# Patient Record
Sex: Male | Born: 1950
Health system: Southern US, Community
[De-identification: ages and names within clinical notes are randomized; demographics above are authoritative.]

## PROBLEM LIST (undated history)

## (undated) DIAGNOSIS — I429 Cardiomyopathy, unspecified: Secondary | ICD-10-CM

## (undated) DIAGNOSIS — I252 Old myocardial infarction: Secondary | ICD-10-CM

## (undated) DIAGNOSIS — I519 Heart disease, unspecified: Secondary | ICD-10-CM

## (undated) DIAGNOSIS — R0609 Other forms of dyspnea: Secondary | ICD-10-CM

## (undated) DIAGNOSIS — J45909 Unspecified asthma, uncomplicated: Secondary | ICD-10-CM

## (undated) DIAGNOSIS — I219 Acute myocardial infarction, unspecified: Secondary | ICD-10-CM

## (undated) DIAGNOSIS — K4090 Unilateral inguinal hernia, without obstruction or gangrene, not specified as recurrent: Secondary | ICD-10-CM

## (undated) DIAGNOSIS — J439 Emphysema, unspecified: Secondary | ICD-10-CM

## (undated) DIAGNOSIS — R7303 Prediabetes: Secondary | ICD-10-CM

## (undated) DIAGNOSIS — I2510 Atherosclerotic heart disease of native coronary artery without angina pectoris: Secondary | ICD-10-CM

## (undated) DIAGNOSIS — E782 Mixed hyperlipidemia: Secondary | ICD-10-CM

## (undated) DIAGNOSIS — J449 Chronic obstructive pulmonary disease, unspecified: Secondary | ICD-10-CM

## (undated) DIAGNOSIS — I213 ST elevation (STEMI) myocardial infarction of unspecified site: Secondary | ICD-10-CM

## (undated) DIAGNOSIS — K219 Gastro-esophageal reflux disease without esophagitis: Secondary | ICD-10-CM

## (undated) DIAGNOSIS — R911 Solitary pulmonary nodule: Secondary | ICD-10-CM

## (undated) DIAGNOSIS — I2699 Other pulmonary embolism without acute cor pulmonale: Secondary | ICD-10-CM

## (undated) DIAGNOSIS — E785 Hyperlipidemia, unspecified: Secondary | ICD-10-CM

## (undated) DIAGNOSIS — J42 Unspecified chronic bronchitis: Secondary | ICD-10-CM

## (undated) DIAGNOSIS — J309 Allergic rhinitis, unspecified: Secondary | ICD-10-CM

## (undated) DIAGNOSIS — I10 Essential (primary) hypertension: Secondary | ICD-10-CM

## (undated) DIAGNOSIS — R079 Chest pain, unspecified: Secondary | ICD-10-CM

## (undated) DIAGNOSIS — N183 Chronic kidney disease, stage 3 (moderate): Secondary | ICD-10-CM

## (undated) DIAGNOSIS — Z79899 Other long term (current) drug therapy: Secondary | ICD-10-CM

## (undated) HISTORY — DX: Unilateral inguinal hernia, without obstruction or gangrene, not specified as recurrent: K40.90

## (undated) HISTORY — DX: Chronic obstructive pulmonary disease, unspecified: J44.9

## (undated) HISTORY — PX: PROSTATE BIOPSY: SHX241

## (undated) HISTORY — DX: Prediabetes: R73.03

## (undated) HISTORY — DX: Mixed hyperlipidemia: E78.2

## (undated) HISTORY — PX: CATARACT EXTRACTION W/ INTRAOCULAR LENS  IMPLANT, BILATERAL: SHX1307

## (undated) HISTORY — DX: Essential (primary) hypertension: I10

## (undated) HISTORY — DX: Other forms of dyspnea: R06.09

## (undated) HISTORY — DX: Solitary pulmonary nodule: R91.1

## (undated) HISTORY — DX: Old myocardial infarction: I25.2

## (undated) HISTORY — DX: Unspecified asthma, uncomplicated: J45.909

## (undated) HISTORY — DX: Cardiomyopathy, unspecified: I42.9

## (undated) HISTORY — DX: Allergic rhinitis, unspecified: J30.9

## (undated) HISTORY — DX: ST elevation (STEMI) myocardial infarction of unspecified site: I21.3

## (undated) HISTORY — DX: Other long term (current) drug therapy: Z79.899

## (undated) HISTORY — DX: Chest pain, unspecified: R07.9

## (undated) HISTORY — DX: Chronic kidney disease, stage 3 (moderate): N18.3

## (undated) HISTORY — PX: CORONARY ANGIOPLASTY WITH STENT PLACEMENT: SHX49

## (undated) HISTORY — DX: Atherosclerotic heart disease of native coronary artery without angina pectoris: I25.10

## (undated) HISTORY — DX: Heart disease, unspecified: I51.9

---

## 1980-11-19 HISTORY — PX: KNEE ARTHROSCOPY: SHX127

## 2015-02-24 ENCOUNTER — Institutional Professional Consult (permissible substitution): Payer: Self-pay | Admitting: Internal Medicine

## 2015-04-25 ENCOUNTER — Ambulatory Visit (INDEPENDENT_AMBULATORY_CARE_PROVIDER_SITE_OTHER): Payer: BC Managed Care – PPO | Admitting: Internal Medicine

## 2015-04-25 VITALS — BP 102/76 | HR 60 | Ht 73.5 in | Wt 197.0 lb

## 2015-04-25 DIAGNOSIS — R0609 Other forms of dyspnea: Secondary | ICD-10-CM | POA: Diagnosis not present

## 2015-04-25 DIAGNOSIS — J441 Chronic obstructive pulmonary disease with (acute) exacerbation: Secondary | ICD-10-CM | POA: Diagnosis not present

## 2015-04-25 DIAGNOSIS — J449 Chronic obstructive pulmonary disease, unspecified: Secondary | ICD-10-CM | POA: Diagnosis not present

## 2015-04-25 NOTE — Progress Notes (Signed)
04/25/15-  64 yoM former smoker self referral- 2007-2008 breathing problems; been to an asthma spec. not change, has a Surveyor, quantity who suggested that he come here . Had a heart attack last year Smoked 1 ppd age [age], quitting 67. 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  CAD/MI/CABG Feb, 2015 Dr Dulce Sellar card  Echocardiogram stress test "okay" December 2015. Denies history of pneumonia or anemia. Large local reaction to pneumonia vaccine. He has been aware of respiratory problems with wheeze and easy dyspnea on exertion since around 2007. Less short of breath since his MI. Allergy skin testing in the past was negative except dust mite. Exacerbations every 3 or 4 months with wheeze and productive cough, relieved by steroids. Some seasonal allergic rhinitis, spring and fall. Using 1 puff of Dulera 101 puff of Qvar 80 each night. Infrequent use of rescue inhaler.  Prior to Admission medications   Medication Sig Start Date End Date Taking? Authorizing Provider  atorvastatin (LIPITOR) 80 MG tablet  04/04/15   Historical Provider, MD  beclomethasone (QVAR) 80 MCG/ACT inhaler Inhale 1 puff into the lungs 2 (two) times daily.   Yes Historical Provider, MD  carvedilol (COREG) 12.5 MG tablet  04/04/15   Historical Provider, MD  Mometasone Furo-Formoterol Fum (DULERA IN) Inhale into the lungs.   Yes Historical Provider, MD  montelukast (SINGULAIR) 10 MG tablet Take 10 mg by mouth daily. 04/04/15   Historical Provider, MD  omeprazole (PRILOSEC) 40 MG capsule Take 40 mg by mouth daily.   Yes Historical Provider, MD  prasugrel (EFFIENT) 10 MG TABS tablet Take 10 mg by mouth daily.   Yes Historical Provider, MD  ramipril (ALTACE) 2.5 MG capsule Take 2.5 mg by mouth daily.   Yes Historical Provider, MD   No past medical history on file. No past surgical history on file. No family history on file. History   Social History  . Marital Status: Married    Spouse Name: N/A  . Number of  Children: N/A  . Years of Education: N/A   Occupational History  . Not on file.   Social History Main Topics  . Smoking status: Not on file  . Smokeless tobacco: Not on file  . Alcohol Use: Not on file  . Drug Use: Not on file  . Sexual Activity: Not on file   Other Topics Concern  . Not on file   Social History Narrative  . No narrative on file   ROS-see HPI   Negative unless "+" Constitutional:    weight loss, night sweats, fevers, chills, fatigue, lassitude. HEENT:    headaches, difficulty swallowing, tooth/dental problems, sore throat,       sneezing, itching, ear ache, +nasal congestion, +post nasal drip, snoring CV:    chest pain, orthopnea, PND, swelling in lower extremities, anasarca,                                  dizziness, palpitations Resp:   +shortness of breath with exertion or at rest.                productive cough,   non-productive cough, coughing up of blood.              change in color of mucus.  wheezing.   Skin:    rash or lesions. GI:  No-   heartburn, indigestion, abdominal pain, nausea, vomiting, diarrhea,  change in bowel habits, loss of appetite GU: dysuria, change in color of urine, no urgency or frequency.   flank pain. MS:   joint pain, stiffness, decreased range of motion, back pain. Neuro-     nothing unusual Psych:  change in mood or affect.  depression or anxiety.   memory loss.  OBJ- Physical Exam General- Alert, Oriented, Affect-appropriate, Distress- none acute Skin- rash-none, lesions- none, excoriation- none Lymphadenopathy- none Head- atraumatic            Eyes- Gross vision intact, PERRLA, conjunctivae and secretions clear            Ears- Hearing, canals-normal            Nose- Clear, no-Septal dev, mucus, polyps, erosion, perforation             Throat- Mallampati II , mucosa clear , drainage- none, tonsils- atrophic,                     + dentures Neck- flexible , trachea midline, no stridor , thyroid nl,  carotid no bruit Chest - symmetrical excursion , unlabored           Heart/CV- RRR , no murmur , no gallop  , no rub, nl s1 s2                           - JVD- none , edema- none, stasis changes- none, varices- none           Lung- clear to P&A/ diminished, + trace crackles, wheeze- none, cough- none , dullness-none, rub- none           Chest wall-  Abd-  Br/ Gen/ Rectal- Not done, not indicated Extrem- cyanosis- none, clubbing, none, atrophy- none, strength- nl Neuro- grossly intact to observation

## 2015-04-25 NOTE — Patient Instructions (Addendum)
Ok to continue the inhalers you are using, the way you are using them for now:       Dulera 100 1 puff, and Qvar 80, 1 puff, at night, then rinse mouth.  Sample Incruse Ellipta inhaler  1 puff, once daily. You can use this by itself, or at the same time as the others.  Watch to see if it makes any difference for your shortness of breath, while the sample lasts.  Order- schedule PFT   Dx COPD with chronic bronchitis  We will contact [facility] for report of the CXR you had last year.

## 2015-04-26 ENCOUNTER — Encounter: Payer: Self-pay | Admitting: Internal Medicine

## 2015-04-26 DIAGNOSIS — R0609 Other forms of dyspnea: Secondary | ICD-10-CM

## 2015-04-26 DIAGNOSIS — J449 Chronic obstructive pulmonary disease, unspecified: Secondary | ICD-10-CM | POA: Insufficient documentation

## 2015-04-26 HISTORY — DX: Chronic obstructive pulmonary disease, unspecified: J44.9

## 2015-04-26 HISTORY — DX: Other forms of dyspnea: R06.09

## 2015-04-26 NOTE — Assessment & Plan Note (Signed)
Repeated exacerbations in a pattern that suggests viral infections and seasonal weather change may be important triggers. We will try to clarify baseline status then reassess medication options

## 2015-04-26 NOTE — Assessment & Plan Note (Signed)
Multifactorial-coronary artery disease status post MI, COPD. Extent of each is an defined. Plan-get chest x-ray report from [facility] since he would rather not pay for an update now. Schedule PFT Try adding Incruse Ellipta.

## 2015-06-27 ENCOUNTER — Ambulatory Visit (INDEPENDENT_AMBULATORY_CARE_PROVIDER_SITE_OTHER): Payer: Medicare Other | Admitting: Internal Medicine

## 2015-06-27 DIAGNOSIS — J441 Chronic obstructive pulmonary disease with (acute) exacerbation: Secondary | ICD-10-CM

## 2015-06-27 LAB — PULMONARY FUNCTION TEST
DL/VA % pred: 75 %
DL/VA: 3.62 ml/min/mmHg/L
DLCO UNC: 27.36 ml/min/mmHg
DLCO unc % pred: 72 %
FEF 25-75 Post: 3.15 L/sec
FEF 25-75 Pre: 3.1 L/sec
FEF2575-%Change-Post: 1 %
FEF2575-%Pred-Post: 101 %
FEF2575-%Pred-Pre: 99 %
FEV1-%Change-Post: 0 %
FEV1-%PRED-PRE: 97 %
FEV1-%Pred-Post: 98 %
FEV1-POST: 3.89 L
FEV1-Pre: 3.86 L
FEV1FVC-%CHANGE-POST: 0 %
FEV1FVC-%Pred-Pre: 101 %
FEV6-%CHANGE-POST: 1 %
FEV6-%Pred-Post: 100 %
FEV6-%Pred-Pre: 99 %
FEV6-PRE: 5 L
FEV6-Post: 5.06 L
FEV6FVC-%CHANGE-POST: 0 %
FEV6FVC-%PRED-POST: 104 %
FEV6FVC-%PRED-PRE: 104 %
FVC-%Change-Post: 1 %
FVC-%PRED-PRE: 95 %
FVC-%Pred-Post: 96 %
FVC-Post: 5.12 L
FVC-Pre: 5.06 L
PRE FEV6/FVC RATIO: 99 %
Post FEV1/FVC ratio: 76 %
Post FEV6/FVC ratio: 99 %
Pre FEV1/FVC ratio: 76 %
RV % pred: 102 %
RV: 2.63 L
TLC % pred: 98 %
TLC: 7.73 L

## 2015-06-27 NOTE — Progress Notes (Signed)
PFT done today. 

## 2015-07-27 ENCOUNTER — Ambulatory Visit (INDEPENDENT_AMBULATORY_CARE_PROVIDER_SITE_OTHER): Payer: Medicare Other | Admitting: Internal Medicine

## 2015-07-27 ENCOUNTER — Encounter: Payer: Self-pay | Admitting: Internal Medicine

## 2015-07-27 VITALS — BP 118/70 | HR 55 | Ht 73.5 in | Wt 200.0 lb

## 2015-07-27 DIAGNOSIS — I2510 Atherosclerotic heart disease of native coronary artery without angina pectoris: Secondary | ICD-10-CM | POA: Diagnosis not present

## 2015-07-27 DIAGNOSIS — J449 Chronic obstructive pulmonary disease, unspecified: Secondary | ICD-10-CM

## 2015-07-27 DIAGNOSIS — R911 Solitary pulmonary nodule: Secondary | ICD-10-CM

## 2015-07-27 DIAGNOSIS — Z23 Encounter for immunization: Secondary | ICD-10-CM

## 2015-07-27 DIAGNOSIS — R0609 Other forms of dyspnea: Secondary | ICD-10-CM | POA: Diagnosis not present

## 2015-07-27 NOTE — Progress Notes (Signed)
04/25/15-  64 yoM former smoker self referral- 2007-2008 breathing problems; been to an asthma spec. not change, has a Surveyor, quantity who suggested that he come here . Had a heart attack last year Smoked 1 ppd age [age], quitting 33. 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  CAD/MI/CABG Feb, 2015 Dr Dulce Sellar card  Echocardiogram stress test "okay" December 2015. Denies history of pneumonia or anemia. Large local reaction to pneumonia vaccine. He has been aware of respiratory problems with wheeze and easy dyspnea on exertion since around 2007. Less short of breath since his MI. Allergy skin testing in the past was negative except dust mite. Exacerbations every 3 or 4 months with wheeze and productive cough, relieved by steroids. Some seasonal allergic rhinitis, spring and fall. Using 1 puff of Dulera 101 puff of Qvar 80 each night. Infrequent use of rescue inhaler.  07/27/15- 64 yoM former smoker followed for COPD, complicated by CAD/ MI/ CABG FOLLOWS FOR: Pt states his breathing has not been too bad-had Kenalog shot in June(usually lasts for about 6 months). Pt has noticed increased congestion in nasal areas-used Flonase helped with this.   Feels okay. He reports a chest x-ray at [facility] was "clear" one year ago. We are seeking that report. He continues Dulera. Got Kenalog injection and antibiotic for wheeze and cough about every 6 months as needed. 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 Recent rhinorrhea and sneezing. PFT-06/27/2015-minimal obstructive airways disease with insignificant response to bronchodilator, minimal diffusion defect, normal lung volumes. FEV1/FVC 0.76, DLCO 72%  Addendum 07/28/2015--chest x-ray report from 03/05/2014 at [facility] described a vague nodular density in the left upper lobe and recommended follow-up CT scan which was never done.  ROS-see HPI   Negative unless  "+" Constitutional:    weight loss, night sweats, fevers, chills, fatigue, lassitude. HEENT:    headaches, difficulty swallowing, tooth/dental problems, sore throat,       sneezing, itching, ear ache, +nasal congestion, +post nasal drip, snoring CV:    chest pain, orthopnea, PND, swelling in lower extremities, anasarca,                                                         dizziness, palpitations Resp:   +shortness of breath with exertion or at rest.                productive cough,   non-productive cough, coughing up of blood.              change in color of mucus.  wheezing.   Skin:    rash or lesions. GI:  No-   heartburn, indigestion, abdominal pain, nausea, vomiting, GU:  MS:   joint pain, stiffness, decreased range of motion, back pain. Neuro-     nothing unusual Psych:  change in mood or affect.  depression or anxiety.   memory loss.  OBJ- Physical Exam General- Alert, Oriented, Affect-appropriate, Distress- none acute Skin- rash-none, lesions- none, excoriation- none Lymphadenopathy- none Head- atraumatic            Eyes- Gross vision intact, PERRLA, conjunctivae and secretions clear            Ears- Hearing, canals-normal            Nose- +  sniffing no-Septal dev, mucus, polyps, erosion, perforation             Throat- Mallampati II , mucosa clear , drainage- none, tonsils- atrophic,                     + dentures Neck- flexible , trachea midline, no stridor , thyroid nl, carotid no bruit Chest - symmetrical excursion , unlabored           Heart/CV- RRR , no murmur , no gallop  , no rub, nl s1 s2                           - JVD- none , edema- none, stasis changes- none, varices- none           Lung- clear to P&A/ diminished, + trace crackles, wheeze- none, cough- none , dullness-none, rub- none           Chest wall-  Abd-  Br/ Gen/ Rectal- Not done, not indicated Extrem- cyanosis- none, clubbing, none, atrophy- none, strength- nl Neuro- grossly intact to  observation

## 2015-07-27 NOTE — Patient Instructions (Addendum)
It is ok to continue your Dulera and Qvar.  You can look at your [facility]- if it is the 100 strength, then it might be cheaper for you to have [place] increase Dulera to the 200 strength, which increases the steroid dose.  We could then drop the Qvar steroid inhaler as an un-needed expense.   Flu vax  Addendum after reviewing chest x-ray report from [facility] for 17 2015-we're contacting him for follow-up chest x-ray for question of left upper lobe nodule

## 2015-07-28 DIAGNOSIS — I25119 Atherosclerotic heart disease of native coronary artery with unspecified angina pectoris: Secondary | ICD-10-CM

## 2015-07-28 DIAGNOSIS — I2510 Atherosclerotic heart disease of native coronary artery without angina pectoris: Secondary | ICD-10-CM | POA: Insufficient documentation

## 2015-07-28 DIAGNOSIS — R911 Solitary pulmonary nodule: Secondary | ICD-10-CM

## 2015-07-28 HISTORY — DX: Atherosclerotic heart disease of native coronary artery with unspecified angina pectoris: I25.119

## 2015-07-28 HISTORY — DX: Solitary pulmonary nodule: R91.1

## 2015-07-28 HISTORY — DX: Atherosclerotic heart disease of native coronary artery without angina pectoris: I25.10

## 2015-07-28 NOTE — Assessment & Plan Note (Signed)
He reported chest x-ray was "clear" Plan-we're calling him for follow-up chest x-ray

## 2015-07-28 NOTE — Assessment & Plan Note (Signed)
Yearly normal PFTs were obtained while he was using Dulera. He reversible obstructive component is not excluded. Plan-flu vaccine

## 2015-07-28 NOTE — Assessment & Plan Note (Signed)
Pulmonary function tests are quite good, suggest his symptom of dyspnea on exertion is more likely cardiogenic.

## 2015-07-28 NOTE — Assessment & Plan Note (Signed)
Managed by cardiology in [place]

## 2015-07-28 NOTE — Addendum Note (Signed)
Addended by: Jetty Duhamel D on: 07/28/2015 09:10 PM   Modules accepted: Kipp Brood

## 2015-07-29 ENCOUNTER — Telehealth: Payer: Self-pay | Admitting: Internal Medicine

## 2015-07-29 DIAGNOSIS — R911 Solitary pulmonary nodule: Secondary | ICD-10-CM

## 2015-07-29 NOTE — Telephone Encounter (Signed)
Pt's wife is returning call to triage for results [phone]

## 2015-07-29 NOTE — Telephone Encounter (Signed)
Per CY: Pt needing a repeat CXR, as soon as pt is available, at [facility] to follow up on lung nodule. Order still needs to be placed then printed and faxed to [facility] radiology dept.   ATC pt. Line continued to ring several times without VM. WCB.   Will forward to [facility] to follow.

## 2015-07-29 NOTE — Telephone Encounter (Signed)
Called spoke with spouse. Aware of below. Order has been faxed to [facility]. Nothing further needed

## 2015-07-29 NOTE — Telephone Encounter (Signed)
Per 07/27/15 OV: Addendum after reviewing chest x-ray report from [facility] for 17 2015-we're contacting him for follow-up chest x-ray for question of left upper lobe nodule ---  Called spoke with pt spouse and made her aware of the above. She understood and needed nothing further

## 2016-01-24 ENCOUNTER — Ambulatory Visit: Payer: Medicare Other | Admitting: Internal Medicine

## 2016-07-19 DIAGNOSIS — J309 Allergic rhinitis, unspecified: Secondary | ICD-10-CM

## 2016-07-19 DIAGNOSIS — I429 Cardiomyopathy, unspecified: Secondary | ICD-10-CM | POA: Insufficient documentation

## 2016-07-19 DIAGNOSIS — I10 Essential (primary) hypertension: Secondary | ICD-10-CM

## 2016-07-19 DIAGNOSIS — N183 Chronic kidney disease, stage 3 unspecified: Secondary | ICD-10-CM | POA: Insufficient documentation

## 2016-07-19 DIAGNOSIS — K4090 Unilateral inguinal hernia, without obstruction or gangrene, not specified as recurrent: Secondary | ICD-10-CM

## 2016-07-19 DIAGNOSIS — E782 Mixed hyperlipidemia: Secondary | ICD-10-CM

## 2016-07-19 DIAGNOSIS — Z79899 Other long term (current) drug therapy: Secondary | ICD-10-CM

## 2016-07-19 DIAGNOSIS — R7303 Prediabetes: Secondary | ICD-10-CM

## 2016-07-19 DIAGNOSIS — J45909 Unspecified asthma, uncomplicated: Secondary | ICD-10-CM | POA: Insufficient documentation

## 2016-07-19 HISTORY — DX: Unspecified asthma, uncomplicated: J45.909

## 2016-07-19 HISTORY — DX: Cardiomyopathy, unspecified: I42.9

## 2016-07-19 HISTORY — DX: Mixed hyperlipidemia: E78.2

## 2016-07-19 HISTORY — DX: Unilateral inguinal hernia, without obstruction or gangrene, not specified as recurrent: K40.90

## 2016-07-19 HISTORY — DX: Chronic kidney disease, stage 3 unspecified: N18.30

## 2016-07-19 HISTORY — DX: Other long term (current) drug therapy: Z79.899

## 2016-07-19 HISTORY — DX: Essential (primary) hypertension: I10

## 2016-07-19 HISTORY — DX: Allergic rhinitis, unspecified: J30.9

## 2016-07-19 HISTORY — DX: Prediabetes: R73.03

## 2016-11-19 HISTORY — PX: CARDIAC CATHETERIZATION: SHX172

## 2016-11-26 DIAGNOSIS — E785 Hyperlipidemia, unspecified: Secondary | ICD-10-CM | POA: Diagnosis not present

## 2016-11-26 DIAGNOSIS — R079 Chest pain, unspecified: Secondary | ICD-10-CM | POA: Diagnosis not present

## 2016-11-26 DIAGNOSIS — J449 Chronic obstructive pulmonary disease, unspecified: Secondary | ICD-10-CM | POA: Diagnosis not present

## 2016-11-26 DIAGNOSIS — I25119 Atherosclerotic heart disease of native coronary artery with unspecified angina pectoris: Secondary | ICD-10-CM | POA: Diagnosis not present

## 2016-11-27 DIAGNOSIS — I25119 Atherosclerotic heart disease of native coronary artery with unspecified angina pectoris: Secondary | ICD-10-CM | POA: Diagnosis not present

## 2016-11-29 DIAGNOSIS — Z7982 Long term (current) use of aspirin: Secondary | ICD-10-CM | POA: Diagnosis not present

## 2016-11-29 DIAGNOSIS — I25118 Atherosclerotic heart disease of native coronary artery with other forms of angina pectoris: Secondary | ICD-10-CM | POA: Diagnosis not present

## 2016-11-29 DIAGNOSIS — Z7951 Long term (current) use of inhaled steroids: Secondary | ICD-10-CM | POA: Diagnosis not present

## 2016-11-29 DIAGNOSIS — E785 Hyperlipidemia, unspecified: Secondary | ICD-10-CM | POA: Diagnosis not present

## 2016-11-29 DIAGNOSIS — I252 Old myocardial infarction: Secondary | ICD-10-CM | POA: Diagnosis not present

## 2016-11-29 DIAGNOSIS — I129 Hypertensive chronic kidney disease with stage 1 through stage 4 chronic kidney disease, or unspecified chronic kidney disease: Secondary | ICD-10-CM | POA: Diagnosis not present

## 2016-11-29 DIAGNOSIS — Z955 Presence of coronary angioplasty implant and graft: Secondary | ICD-10-CM | POA: Diagnosis not present

## 2016-11-29 DIAGNOSIS — Z79899 Other long term (current) drug therapy: Secondary | ICD-10-CM | POA: Diagnosis not present

## 2016-11-29 DIAGNOSIS — J449 Chronic obstructive pulmonary disease, unspecified: Secondary | ICD-10-CM | POA: Diagnosis not present

## 2016-11-29 DIAGNOSIS — Z87891 Personal history of nicotine dependence: Secondary | ICD-10-CM | POA: Diagnosis not present

## 2016-11-29 DIAGNOSIS — N183 Chronic kidney disease, stage 3 (moderate): Secondary | ICD-10-CM | POA: Diagnosis not present

## 2016-11-29 DIAGNOSIS — R55 Syncope and collapse: Secondary | ICD-10-CM | POA: Diagnosis not present

## 2016-11-29 DIAGNOSIS — R079 Chest pain, unspecified: Secondary | ICD-10-CM | POA: Diagnosis not present

## 2016-11-29 DIAGNOSIS — I25110 Atherosclerotic heart disease of native coronary artery with unstable angina pectoris: Secondary | ICD-10-CM | POA: Diagnosis not present

## 2016-12-25 DIAGNOSIS — E785 Hyperlipidemia, unspecified: Secondary | ICD-10-CM | POA: Diagnosis not present

## 2016-12-25 DIAGNOSIS — I25119 Atherosclerotic heart disease of native coronary artery with unspecified angina pectoris: Secondary | ICD-10-CM | POA: Diagnosis not present

## 2016-12-25 DIAGNOSIS — J449 Chronic obstructive pulmonary disease, unspecified: Secondary | ICD-10-CM | POA: Diagnosis not present

## 2016-12-25 DIAGNOSIS — Z6826 Body mass index (BMI) 26.0-26.9, adult: Secondary | ICD-10-CM | POA: Diagnosis not present

## 2016-12-25 DIAGNOSIS — R002 Palpitations: Secondary | ICD-10-CM | POA: Diagnosis not present

## 2017-01-17 HISTORY — PX: CORONARY ANGIOPLASTY WITH STENT PLACEMENT: SHX49

## 2017-01-22 DIAGNOSIS — J4520 Mild intermittent asthma, uncomplicated: Secondary | ICD-10-CM | POA: Diagnosis not present

## 2017-01-22 DIAGNOSIS — I25118 Atherosclerotic heart disease of native coronary artery with other forms of angina pectoris: Secondary | ICD-10-CM | POA: Diagnosis not present

## 2017-01-22 DIAGNOSIS — I129 Hypertensive chronic kidney disease with stage 1 through stage 4 chronic kidney disease, or unspecified chronic kidney disease: Secondary | ICD-10-CM | POA: Diagnosis not present

## 2017-01-22 DIAGNOSIS — N183 Chronic kidney disease, stage 3 (moderate): Secondary | ICD-10-CM | POA: Diagnosis not present

## 2017-01-22 DIAGNOSIS — E782 Mixed hyperlipidemia: Secondary | ICD-10-CM | POA: Diagnosis not present

## 2017-01-22 DIAGNOSIS — R7303 Prediabetes: Secondary | ICD-10-CM | POA: Diagnosis not present

## 2017-01-22 DIAGNOSIS — Z79899 Other long term (current) drug therapy: Secondary | ICD-10-CM | POA: Diagnosis not present

## 2017-02-01 DIAGNOSIS — F17211 Nicotine dependence, cigarettes, in remission: Secondary | ICD-10-CM | POA: Diagnosis not present

## 2017-02-01 DIAGNOSIS — I25118 Atherosclerotic heart disease of native coronary artery with other forms of angina pectoris: Secondary | ICD-10-CM | POA: Diagnosis not present

## 2017-02-01 DIAGNOSIS — E785 Hyperlipidemia, unspecified: Secondary | ICD-10-CM | POA: Diagnosis not present

## 2017-02-01 DIAGNOSIS — I252 Old myocardial infarction: Secondary | ICD-10-CM | POA: Diagnosis not present

## 2017-02-01 DIAGNOSIS — Z7982 Long term (current) use of aspirin: Secondary | ICD-10-CM | POA: Diagnosis not present

## 2017-02-01 DIAGNOSIS — R0789 Other chest pain: Secondary | ICD-10-CM | POA: Diagnosis not present

## 2017-02-01 DIAGNOSIS — R001 Bradycardia, unspecified: Secondary | ICD-10-CM | POA: Diagnosis not present

## 2017-02-01 DIAGNOSIS — I2102 ST elevation (STEMI) myocardial infarction involving left anterior descending coronary artery: Secondary | ICD-10-CM | POA: Diagnosis not present

## 2017-02-01 DIAGNOSIS — I129 Hypertensive chronic kidney disease with stage 1 through stage 4 chronic kidney disease, or unspecified chronic kidney disease: Secondary | ICD-10-CM | POA: Diagnosis not present

## 2017-02-01 DIAGNOSIS — I25119 Atherosclerotic heart disease of native coronary artery with unspecified angina pectoris: Secondary | ICD-10-CM | POA: Diagnosis not present

## 2017-02-01 DIAGNOSIS — I2119 ST elevation (STEMI) myocardial infarction involving other coronary artery of inferior wall: Secondary | ICD-10-CM | POA: Diagnosis not present

## 2017-02-01 DIAGNOSIS — I10 Essential (primary) hypertension: Secondary | ICD-10-CM | POA: Diagnosis not present

## 2017-02-01 DIAGNOSIS — R079 Chest pain, unspecified: Secondary | ICD-10-CM

## 2017-02-01 DIAGNOSIS — Z955 Presence of coronary angioplasty implant and graft: Secondary | ICD-10-CM | POA: Diagnosis not present

## 2017-02-01 DIAGNOSIS — E784 Other hyperlipidemia: Secondary | ICD-10-CM | POA: Diagnosis not present

## 2017-02-01 DIAGNOSIS — I214 Non-ST elevation (NSTEMI) myocardial infarction: Secondary | ICD-10-CM | POA: Diagnosis not present

## 2017-02-01 DIAGNOSIS — I2510 Atherosclerotic heart disease of native coronary artery without angina pectoris: Secondary | ICD-10-CM | POA: Diagnosis not present

## 2017-02-01 DIAGNOSIS — N189 Chronic kidney disease, unspecified: Secondary | ICD-10-CM | POA: Diagnosis not present

## 2017-02-01 DIAGNOSIS — Z87891 Personal history of nicotine dependence: Secondary | ICD-10-CM | POA: Diagnosis not present

## 2017-02-01 DIAGNOSIS — I119 Hypertensive heart disease without heart failure: Secondary | ICD-10-CM | POA: Diagnosis not present

## 2017-02-01 DIAGNOSIS — Z6826 Body mass index (BMI) 26.0-26.9, adult: Secondary | ICD-10-CM | POA: Diagnosis not present

## 2017-02-01 HISTORY — DX: Chest pain, unspecified: R07.9

## 2017-02-11 DIAGNOSIS — J4520 Mild intermittent asthma, uncomplicated: Secondary | ICD-10-CM | POA: Diagnosis not present

## 2017-02-11 DIAGNOSIS — I255 Ischemic cardiomyopathy: Secondary | ICD-10-CM | POA: Diagnosis not present

## 2017-02-11 DIAGNOSIS — I25118 Atherosclerotic heart disease of native coronary artery with other forms of angina pectoris: Secondary | ICD-10-CM | POA: Diagnosis not present

## 2017-02-11 DIAGNOSIS — N183 Chronic kidney disease, stage 3 (moderate): Secondary | ICD-10-CM | POA: Diagnosis not present

## 2017-02-11 DIAGNOSIS — I129 Hypertensive chronic kidney disease with stage 1 through stage 4 chronic kidney disease, or unspecified chronic kidney disease: Secondary | ICD-10-CM | POA: Diagnosis not present

## 2017-03-05 DIAGNOSIS — Z6825 Body mass index (BMI) 25.0-25.9, adult: Secondary | ICD-10-CM | POA: Diagnosis not present

## 2017-03-05 DIAGNOSIS — E785 Hyperlipidemia, unspecified: Secondary | ICD-10-CM | POA: Diagnosis not present

## 2017-03-05 DIAGNOSIS — J449 Chronic obstructive pulmonary disease, unspecified: Secondary | ICD-10-CM | POA: Diagnosis not present

## 2017-03-05 DIAGNOSIS — I25119 Atherosclerotic heart disease of native coronary artery with unspecified angina pectoris: Secondary | ICD-10-CM | POA: Diagnosis not present

## 2017-05-30 DIAGNOSIS — I519 Heart disease, unspecified: Secondary | ICD-10-CM | POA: Diagnosis not present

## 2017-05-30 DIAGNOSIS — I255 Ischemic cardiomyopathy: Secondary | ICD-10-CM | POA: Insufficient documentation

## 2017-05-30 DIAGNOSIS — I252 Old myocardial infarction: Secondary | ICD-10-CM

## 2017-05-30 DIAGNOSIS — E785 Hyperlipidemia, unspecified: Secondary | ICD-10-CM | POA: Diagnosis not present

## 2017-05-30 DIAGNOSIS — Z6826 Body mass index (BMI) 26.0-26.9, adult: Secondary | ICD-10-CM | POA: Diagnosis not present

## 2017-05-30 DIAGNOSIS — I10 Essential (primary) hypertension: Secondary | ICD-10-CM | POA: Diagnosis not present

## 2017-05-30 DIAGNOSIS — I2510 Atherosclerotic heart disease of native coronary artery without angina pectoris: Secondary | ICD-10-CM | POA: Diagnosis not present

## 2017-05-30 HISTORY — DX: Old myocardial infarction: I25.2

## 2017-05-30 HISTORY — DX: Ischemic cardiomyopathy: I25.5

## 2017-06-04 ENCOUNTER — Ambulatory Visit: Payer: Medicare Other | Admitting: Cardiology

## 2017-07-29 DIAGNOSIS — Z79899 Other long term (current) drug therapy: Secondary | ICD-10-CM | POA: Diagnosis not present

## 2017-07-29 DIAGNOSIS — E782 Mixed hyperlipidemia: Secondary | ICD-10-CM | POA: Diagnosis not present

## 2017-07-29 DIAGNOSIS — J302 Other seasonal allergic rhinitis: Secondary | ICD-10-CM | POA: Diagnosis not present

## 2017-07-29 DIAGNOSIS — R7303 Prediabetes: Secondary | ICD-10-CM | POA: Diagnosis not present

## 2017-07-29 DIAGNOSIS — I25118 Atherosclerotic heart disease of native coronary artery with other forms of angina pectoris: Secondary | ICD-10-CM | POA: Diagnosis not present

## 2017-07-29 DIAGNOSIS — Z125 Encounter for screening for malignant neoplasm of prostate: Secondary | ICD-10-CM | POA: Diagnosis not present

## 2017-07-29 DIAGNOSIS — I255 Ischemic cardiomyopathy: Secondary | ICD-10-CM | POA: Diagnosis not present

## 2017-07-29 DIAGNOSIS — N183 Chronic kidney disease, stage 3 (moderate): Secondary | ICD-10-CM | POA: Diagnosis not present

## 2017-07-29 DIAGNOSIS — Z0000 Encounter for general adult medical examination without abnormal findings: Secondary | ICD-10-CM | POA: Diagnosis not present

## 2017-07-29 DIAGNOSIS — I129 Hypertensive chronic kidney disease with stage 1 through stage 4 chronic kidney disease, or unspecified chronic kidney disease: Secondary | ICD-10-CM | POA: Diagnosis not present

## 2017-09-23 DIAGNOSIS — M25512 Pain in left shoulder: Secondary | ICD-10-CM | POA: Diagnosis not present

## 2017-10-15 ENCOUNTER — Emergency Department (HOSPITAL_COMMUNITY): Payer: PPO

## 2017-10-15 ENCOUNTER — Other Ambulatory Visit: Payer: Self-pay

## 2017-10-15 ENCOUNTER — Encounter (HOSPITAL_COMMUNITY): Payer: Self-pay

## 2017-10-15 ENCOUNTER — Observation Stay (HOSPITAL_COMMUNITY)
Admission: EM | Admit: 2017-10-15 | Discharge: 2017-10-17 | Disposition: A | Discharge status: Home / Self Care | Payer: PPO | Attending: Family Medicine | Admitting: Family Medicine

## 2017-10-15 DIAGNOSIS — Z7902 Long term (current) use of antithrombotics/antiplatelets: Secondary | ICD-10-CM | POA: Insufficient documentation

## 2017-10-15 DIAGNOSIS — K219 Gastro-esophageal reflux disease without esophagitis: Secondary | ICD-10-CM | POA: Insufficient documentation

## 2017-10-15 DIAGNOSIS — I2699 Other pulmonary embolism without acute cor pulmonale: Secondary | ICD-10-CM | POA: Diagnosis not present

## 2017-10-15 DIAGNOSIS — Z7982 Long term (current) use of aspirin: Secondary | ICD-10-CM | POA: Insufficient documentation

## 2017-10-15 DIAGNOSIS — I509 Heart failure, unspecified: Secondary | ICD-10-CM | POA: Diagnosis not present

## 2017-10-15 DIAGNOSIS — Z87891 Personal history of nicotine dependence: Secondary | ICD-10-CM | POA: Diagnosis not present

## 2017-10-15 DIAGNOSIS — Z887 Allergy status to serum and vaccine status: Secondary | ICD-10-CM | POA: Insufficient documentation

## 2017-10-15 DIAGNOSIS — I110 Hypertensive heart disease with heart failure: Secondary | ICD-10-CM | POA: Insufficient documentation

## 2017-10-15 DIAGNOSIS — Z79899 Other long term (current) drug therapy: Secondary | ICD-10-CM | POA: Insufficient documentation

## 2017-10-15 DIAGNOSIS — R0602 Shortness of breath: Secondary | ICD-10-CM | POA: Diagnosis not present

## 2017-10-15 DIAGNOSIS — R071 Chest pain on breathing: Secondary | ICD-10-CM | POA: Diagnosis present

## 2017-10-15 DIAGNOSIS — J439 Emphysema, unspecified: Secondary | ICD-10-CM | POA: Insufficient documentation

## 2017-10-15 DIAGNOSIS — E785 Hyperlipidemia, unspecified: Secondary | ICD-10-CM | POA: Diagnosis not present

## 2017-10-15 DIAGNOSIS — R072 Precordial pain: Secondary | ICD-10-CM | POA: Diagnosis not present

## 2017-10-15 DIAGNOSIS — I2510 Atherosclerotic heart disease of native coronary artery without angina pectoris: Secondary | ICD-10-CM | POA: Diagnosis not present

## 2017-10-15 DIAGNOSIS — R079 Chest pain, unspecified: Secondary | ICD-10-CM | POA: Diagnosis not present

## 2017-10-15 DIAGNOSIS — I252 Old myocardial infarction: Secondary | ICD-10-CM | POA: Insufficient documentation

## 2017-10-15 DIAGNOSIS — Z955 Presence of coronary angioplasty implant and graft: Secondary | ICD-10-CM | POA: Diagnosis not present

## 2017-10-15 HISTORY — DX: Gastro-esophageal reflux disease without esophagitis: K21.9

## 2017-10-15 HISTORY — DX: Emphysema, unspecified: J43.9

## 2017-10-15 HISTORY — DX: Hyperlipidemia, unspecified: E78.5

## 2017-10-15 HISTORY — DX: Other pulmonary embolism without acute cor pulmonale: I26.99

## 2017-10-15 HISTORY — DX: Unspecified chronic bronchitis: J42

## 2017-10-15 HISTORY — DX: Atherosclerotic heart disease of native coronary artery without angina pectoris: I25.10

## 2017-10-15 HISTORY — DX: Acute myocardial infarction, unspecified: I21.9

## 2017-10-15 HISTORY — DX: Essential (primary) hypertension: I10

## 2017-10-15 LAB — CBC
HEMATOCRIT: 44.6 % (ref 39.0–52.0)
HEMOGLOBIN: 14.9 g/dL (ref 13.0–17.0)
MCH: 29.8 pg (ref 26.0–34.0)
MCHC: 33.4 g/dL (ref 30.0–36.0)
MCV: 89.2 fL (ref 78.0–100.0)
PLATELETS: 203 10*3/uL (ref 150–400)
RBC: 5 MIL/uL (ref 4.22–5.81)
RDW: 14.8 % (ref 11.5–15.5)
WBC: 12 10*3/uL — AB (ref 4.0–10.5)

## 2017-10-15 LAB — BASIC METABOLIC PANEL
ANION GAP: 7 (ref 5–15)
BUN: 21 mg/dL — ABNORMAL HIGH (ref 6–20)
CHLORIDE: 104 mmol/L (ref 101–111)
CO2: 28 mmol/L (ref 22–32)
CREATININE: 1.13 mg/dL (ref 0.61–1.24)
Calcium: 9.1 mg/dL (ref 8.9–10.3)
GFR calc non Af Amer: >60 mL/min (ref >60)
Glucose, Bld: 110 mg/dL — ABNORMAL HIGH (ref 65–99)
POTASSIUM: 4.4 mmol/L (ref 3.5–5.1)
SODIUM: 139 mmol/L (ref 135–145)

## 2017-10-15 LAB — I-STAT TROPONIN, ED
TROPONIN I, POC: 0.01 ng/mL (ref 0.00–0.08)
Troponin i, poc: 0 ng/mL (ref 0.00–0.08)

## 2017-10-15 LAB — TROPONIN I

## 2017-10-15 MED ORDER — ACETAMINOPHEN 325 MG PO TABS: 650.0000 mg | ORAL_TABLET | Freq: Four times a day (QID) | ORAL | Status: DC | PRN

## 2017-10-15 MED ORDER — IOPAMIDOL (ISOVUE-370) INJECTION 76%
INTRAVENOUS | Status: AC
  Administered 2017-10-15: 100 mL
  Filled 2017-10-15: qty 100

## 2017-10-15 MED ORDER — PANTOPRAZOLE SODIUM 40 MG PO TBEC
40.0000 mg | DELAYED_RELEASE_TABLET | Freq: Every day | ORAL | Status: DC
  Administered 2017-10-15 – 2017-10-17 (×3): 40 mg via ORAL
  Filled 2017-10-15 (×3): qty 1

## 2017-10-15 MED ORDER — MOMETASONE FURO-FORMOTEROL FUM 100-5 MCG/ACT IN AERO
2.0000 | INHALATION_SPRAY | Freq: Every day | RESPIRATORY_TRACT | Status: DC
Start: 2017-10-15 — End: 2017-10-17
  Administered 2017-10-16: 2 via RESPIRATORY_TRACT
  Filled 2017-10-15: qty 8.8

## 2017-10-15 MED ORDER — HEPARIN (PORCINE) IN NACL 100-0.45 UNIT/ML-% IJ SOLN: 14.0000 [IU]/kg/h | Freq: Once | INTRAMUSCULAR | Status: DC

## 2017-10-15 MED ORDER — MORPHINE SULFATE (PF) 4 MG/ML IV SOLN: 1.0000 mg | INTRAVENOUS | Status: DC | PRN

## 2017-10-15 MED ORDER — ATORVASTATIN CALCIUM 80 MG PO TABS
80.0000 mg | ORAL_TABLET | Freq: Every evening | ORAL | Status: DC
  Administered 2017-10-15 – 2017-10-16 (×2): 80 mg via ORAL
  Filled 2017-10-15 (×2): qty 1

## 2017-10-15 MED ORDER — HEPARIN (PORCINE) IN NACL 100-0.45 UNIT/ML-% IJ SOLN
1500.0000 [IU]/h | INTRAMUSCULAR | Status: DC
  Administered 2017-10-15: 1500 [IU]/h via INTRAVENOUS
  Filled 2017-10-15: qty 250

## 2017-10-15 MED ORDER — ASPIRIN 81 MG PO CHEW
324.0000 mg | CHEWABLE_TABLET | Freq: Once | ORAL | Status: AC
  Administered 2017-10-15: 324 mg via ORAL
  Filled 2017-10-15: qty 4

## 2017-10-15 MED ORDER — NITROGLYCERIN 0.4 MG SL SUBL
0.4000 mg | SUBLINGUAL_TABLET | SUBLINGUAL | Status: DC | PRN
  Administered 2017-10-15 (×5): 0.4 mg via SUBLINGUAL
  Filled 2017-10-15 (×2): qty 1

## 2017-10-15 MED ORDER — CLOPIDOGREL BISULFATE 75 MG PO TABS
75.0000 mg | ORAL_TABLET | Freq: Every day | ORAL | Status: DC
  Administered 2017-10-15 – 2017-10-17 (×3): 75 mg via ORAL
  Filled 2017-10-15 (×3): qty 1

## 2017-10-15 MED ORDER — SODIUM CHLORIDE 0.9 % IV SOLN
INTRAVENOUS | Status: DC
  Administered 2017-10-15 – 2017-10-16 (×3): via INTRAVENOUS

## 2017-10-15 MED ORDER — RIVAROXABAN 15 MG PO TABS
15.0000 mg | ORAL_TABLET | Freq: Two times a day (BID) | ORAL | Status: DC
Start: 2017-10-15 — End: 2017-10-17
  Administered 2017-10-15 – 2017-10-17 (×4): 15 mg via ORAL
  Filled 2017-10-15 (×4): qty 1

## 2017-10-15 MED ORDER — HEPARIN SODIUM (PORCINE) 5000 UNIT/ML IJ SOLN
4000.0000 [IU] | Freq: Once | INTRAMUSCULAR | Status: AC
  Administered 2017-10-15: 4000 [IU] via INTRAVENOUS
  Filled 2017-10-15: qty 1

## 2017-10-15 MED ORDER — MORPHINE SULFATE (PF) 4 MG/ML IV SOLN
2.0000 mg | INTRAVENOUS | Status: DC | PRN
  Administered 2017-10-15: 2 mg via INTRAVENOUS
  Filled 2017-10-15: qty 1

## 2017-10-15 MED ORDER — CARVEDILOL 12.5 MG PO TABS
12.5000 mg | ORAL_TABLET | Freq: Two times a day (BID) | ORAL | Status: DC
  Administered 2017-10-15 – 2017-10-17 (×5): 12.5 mg via ORAL
  Filled 2017-10-15 (×5): qty 1

## 2017-10-15 MED ORDER — ASPIRIN 81 MG PO CHEW
81.0000 mg | CHEWABLE_TABLET | Freq: Every day | ORAL | Status: DC
  Administered 2017-10-16 – 2017-10-17 (×2): 81 mg via ORAL
  Filled 2017-10-15 (×2): qty 1

## 2017-10-15 MED ORDER — MONTELUKAST SODIUM 10 MG PO TABS
10.0000 mg | ORAL_TABLET | Freq: Every day | ORAL | Status: DC
  Administered 2017-10-15 – 2017-10-16 (×2): 10 mg via ORAL
  Filled 2017-10-15 (×2): qty 1

## 2017-10-15 NOTE — Care Management Obs Status (Signed)
MEDICARE OBSERVATION STATUS NOTIFICATION   Patient Details  Name: Derek Hart MRN: [id number] Date of Birth: 06/02/1951   Medicare Observation Status Notification Given:  Yes    Lawerance Sabal, RN 10/15/2017, 2:00 PM

## 2017-10-15 NOTE — ED Notes (Signed)
Admitting at the bedside.  

## 2017-10-15 NOTE — H&P (Signed)
Family Medicine Teaching [facility] Admission History and Physical Service Pager: [phone]  Patient name: Yoshio Bradle Medical record number: [id number] Date of birth: March 17, 1951 Age: 66 y.o. Gender: male  Primary Care Provider: Gordan Payment., MD Consultants: none Code Status: FULL  Chief Complaint: chest pain, pleuritic   Assessment and Plan: Ayman Vicary is a 66 y.o. male presenting with pleuritic chest pain found to have pulmonary embolus (provoked). PMH is significant for CAD with stenting in 2015 and stent in distal LAD in 01/2017, HTN, HLD, COPD.   Left Pleuritic Chest Pain 2/2 Pulmonary Embolus, Provoked: CTA chest with multifocal Left lung PE without signs of right heart strain. Vitals are stable without supplemental oxygen requirement. Provoked due to long car ride about 1 week ago. EKG with T wave inversion in V1-V4. Unable to view prior EKGs in care everywhere and we do not have any in our system to compare. Unlikely cardiac related due to the atypical nature of symptoms, that it is likely due to PE diagnosed on imaging, and istat troponins are negative x 2. No current chest pain.  - admit to observation, attending Dr. Deirdre Priest - patient started on IV heparin in the ED. Will discontinue and order Xarelto as there is no contraindication to transitioning to orals.  - will get in contact with patient's cardiologist to see if prior EKG can be faxed over for comparison. Additionally will ask for further recommendations. If EKG is unchanged, then will likely be able to be discharged.  - will order one more Troponin I   CAD with history of stents: Stenting in 2015 and stent in distal LAD in 01/2017. - continue Coreg 12.5mg  BID - received ASA 325mg  today, resume home ASA 81mg  daily tomorrow - continue home Plavix 75mg  daily - continue home Lipitor 40mg  daily  HTN: BP stable  - continue Coreg - holding ramipril currently due to low normal BP (likely due to nitroglycerin PRN)    COPD:  - continue home Dulera and singulair   GERD:  - due to formulary, Protonix while in the hospital  FEN/GI: cardiac, PPI Prophylaxis: on Xarelto   Disposition: admit   History of Present Illness:  Kumar Mesler is a 66 y.o. male presenting with left lateral chest pain.   Reports left lateral chest pain started around 11 pm yesterday evening. It was sudden onset. He was about to go to bed but could not get comfortable in bed. He describes the pain as a constant dull pain with sharp pain with deep inspiration or with twisting of the trunk. Reports that ambulating helps relieve the pain and it is more significant at rest. No associated nausea or diaphoresis. Did have some mild shortness of breath.  He reports that he did drive to [place] (3.5 hr car ride) without breaks. He drove back the same day. No prior history of DVT/PE. No personal history of malignancy. Denies lower extremity swelling or calf pain.   He was brought to the ED by EMS. He received nitro with some relief. His vitals were stable in the ED. EKG with T wave inversions in the V1-V4 leads but no prior EKG to compare. istat troponins negative x 2. Pain is now resolved he reports. CBC and BMP are unremarkable other than mild leukocytosis to 12. CXR negative. CTA chest with multifocal Left lung PE, possible Left lower lobe pulmonary infarct, and trace L pleural effusion. Patient was started in IV heparin in the ED.   Review Of Systems: Per HPI  with the following additions:  Review of Systems  Constitutional: Negative for chills, fever and weight loss.  HENT: Negative for congestion and sore throat.   Eyes: Negative for blurred vision.  Respiratory: Positive for shortness of breath. Negative for cough and hemoptysis.   Cardiovascular: Positive for chest pain. Negative for palpitations, leg swelling and PND.  Gastrointestinal: Negative for abdominal pain, blood in stool, constipation, diarrhea, melena, nausea and  vomiting.  Genitourinary: Negative for dysuria and frequency.  Musculoskeletal: Negative for myalgias.  Skin: Negative for rash.  Neurological: Negative for sensory change, focal weakness and headaches.  Endo/Heme/Allergies: Bruises/bleeds easily.  Psychiatric/Behavioral: Negative for substance abuse and suicidal ideas.    Patient Active Problem List   Diagnosis Date Noted  . Lung nodule seen on imaging study 07/28/2015  . CAD (coronary artery disease), native coronary artery 07/28/2015  . Dyspnea on exertion 04/26/2015  . COPD mixed type (HCC) 04/26/2015    Past Medical History: Past Medical History:  Diagnosis Date  . Asthma   . COPD (chronic obstructive pulmonary disease) (HCC)   . Emphysema lung (HCC)   . Hypertension   . MI (myocardial infarction) [facility](HCC)     Past Surgical History: Past Surgical History:  Procedure Laterality Date  . CORONARY STENT INTERVENTION    . JOINT REPLACEMENT     knee    Social History: Social History   Tobacco Use  . Smoking status: Former Smoker    Packs/day: 1.00    Years: 14.00    Pack years: 14.00    Types: Cigarettes  . Smokeless tobacco: Former NeurosurgeonUser    Quit date: 08/19/1977  Substance Use Topics  . Alcohol use: No    Alcohol/week: 0.0 oz    Frequency: Never  . Drug use: No   Additional social history:  Please also refer to relevant sections of EMR.  Family History: Grandmother MI at 67yo Brother MI at 67 years old   Allergies and Medications: Allergies  Allergen Reactions  . Pneumovax 23 [Pneumococcal Vac Polyvalent] Rash   No current facility-administered medications on file prior to encounter.    Current Outpatient Medications on File Prior to Encounter  Medication Sig Dispense Refill  . albuterol (PROVENTIL HFA;VENTOLIN HFA) 108 (90 Base) MCG/ACT inhaler Inhale 1 puff into the lungs every 6 (six) hours as needed for wheezing or shortness of breath.    Marland Kitchen. aspirin 81 MG chewable tablet Chew 81 mg by mouth daily.     Marland Kitchen. atorvastatin (LIPITOR) 80 MG tablet Take 80 mg by mouth every evening.   2  . carvedilol (COREG) 12.5 MG tablet Take 12.5 mg by mouth 2 (two) times daily.   2  . Mometasone Furo-Formoterol Fum (DULERA IN) Inhale 2 puffs into the lungs at bedtime.     . montelukast (SINGULAIR) 10 MG tablet Take 10 mg by mouth daily.  2  . nitroGLYCERIN (NITROSTAT) 0.4 MG SL tablet Place 0.4 mg under the tongue every 5 (five) minutes as needed for chest pain.    Marland Kitchen. omeprazole (PRILOSEC) 40 MG capsule Take 40 mg by mouth daily.    . ramipril (ALTACE) 5 MG capsule Take 5 mg by mouth daily.      Objective: BP 109/72   Pulse 85   Temp 98.2 F (36.8 C) (Oral)   Resp 15   Ht 6\' 2"  (1.88 m)   Wt 195 lb (88.5 kg)   SpO2 94%   BMI 25.04 kg/m  Exam: General: NAD, resting comfortably  Eyes: PERRL,  EMOI, Sclerae clear ENTM: ororpharynx with mild erythema without exudates Neck: supple normal ROM Cardiovascular: RRR, no m/r/g Respiratory: normal effort, mild crackles on the left base otherwise normal Gastrointestinal: soft, nontender, nondistended, + bowel sounds MSK: able to move all extremities  Derm: no rash  Neuro: CN2-12 intact, strength and sensation normal bilaterally in upper and lower extremities Psych: normal mood and affect  Labs and Imaging: CBC BMET  Recent Labs  Lab 10/15/17 0336  WBC 12.0*  HGB 14.9  HCT 44.6  PLT 203   Recent Labs  Lab 10/15/17 0336  NA 139  K 4.4  CL 104  CO2 28  BUN 21*  CREATININE 1.13  GLUCOSE 110*  CALCIUM 9.1     istat troponin: 0.00, 0.01 EKG: NSR, T wave inversion V1-V4 (no prior EKG to compare)  CXR:  FINDINGS: The cardiomediastinal contours are normal. The lungs are clear. Pulmonary vasculature is normal. No consolidation, pleural effusion, or pneumothorax. No acute osseous abnormalities are seen.  IMPRESSION: No acute pulmonary process  CTA chest:  IMPRESSION: 1. Positive for multifocal left lung pulmonary embolus. No central or  saddle embolus. 2. Critical Value/emergent results were called by telephone at the time of interpretation on 10/15/2017 at 11:12 am to Dr. Vanetta Mulders , who verbally acknowledged these results. 3. Possible left lower lobe pulmonary infarcts. Trace left pleural effusion. Additional nonspecific bilateral lower lobe patchy opacity. 4. Calcified coronary artery atherosclerosis and stents. Mild cardiomegaly. No pericardial effusion.  Palma Holter, MD 10/15/2017, 11:31 AM PGY-3, [facility] FPTS Intern pager: [phone], text pages welcome

## 2017-10-15 NOTE — ED Provider Notes (Signed)
[facility] EMERGENCY DEPARTMENT Provider Note   CSN: [id number] Arrival date & time: 10/15/17  2979     History   Chief Complaint Chief Complaint  Patient presents with  . Chest Pain    HPI Derek Hart is a 66 y.o. male.  Patient with acute onset of left-sided chest pain some substernal radiating to the left lateral chest area at 2330 last evening.  Patient has a known history of coronary artery disease.  Had stents in 2015 and most recently had an additional stent placed in March 2018 following a non-STEMI.  Patient initially went to [facility].  Weight was long and then came here.  Patient had troponin done at 3 in the morning that was negative.  EKG without acute changes.  Chest x-ray negative.  Patient states that the pain is made worse by breathing.  Patient did take 2 nitroglycerin with some improvement in the pain.  Currently the pain is 7 out of 10.      Past Medical History:  Diagnosis Date  . Asthma   . COPD (chronic obstructive pulmonary disease) (HCC)   . Emphysema lung (HCC)   . Hypertension   . MI (myocardial infarction) [facility])     Patient Active Problem List   Diagnosis Date Noted  . Lung nodule seen on imaging study 07/28/2015  . CAD (coronary artery disease), native coronary artery 07/28/2015  . Dyspnea on exertion 04/26/2015  . 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 CORONARY STENT INTERVENTION    . JOINT REPLACEMENT     knee       Home Medications    Prior to Admission medications   Medication Sig Start Date End Date Taking? Authorizing Provider  albuterol (PROVENTIL HFA;VENTOLIN HFA) 108 (90 Base) MCG/ACT inhaler Inhale 1 puff into the lungs every 6 (six) hours as needed for wheezing or shortness of breath.   Yes [provider]  aspirin 81 MG chewable tablet Chew 81 mg by mouth daily.   Yes [provider]  atorvastatin (LIPITOR) 80 MG tablet  Take 80 mg by mouth every evening.  04/04/15  Yes [provider]  carvedilol (COREG) 12.5 MG tablet Take 12.5 mg by mouth 2 (two) times daily.  04/04/15  Yes [provider]  Mometasone Furo-Formoterol Fum (DULERA IN) Inhale 2 puffs into the lungs at bedtime.    Yes [provider]  montelukast (SINGULAIR) 10 MG tablet Take 10 mg by mouth daily. 04/04/15  Yes [provider]  nitroGLYCERIN (NITROSTAT) 0.4 MG SL tablet Place 0.4 mg under the tongue every 5 (five) minutes as needed for chest pain.   Yes [provider]  omeprazole (PRILOSEC) 40 MG capsule Take 40 mg by mouth daily.   Yes [provider]  ramipril (ALTACE) 5 MG capsule Take 5 mg by mouth daily.   Yes [provider]    Family History No family history on file.  Social History Social History   Tobacco Use  . Smoking status: Former Smoker    Packs/day: 1.00    Years: 14.00    Pack years: 14.00    Types: Cigarettes  . Smokeless tobacco: Former Neurosurgeon    Quit date: 08/19/1977  Substance Use Topics  . Alcohol use: No    Alcohol/week: 0.0 oz    Frequency: Never  . Drug use: No     Allergies   Pneumovax 23 [pneumococcal vac polyvalent]  Review of Systems Review of Systems  Constitutional: Positive for diaphoresis. Negative for fever.  HENT: Negative for congestion.   Eyes: Negative for visual disturbance.  Respiratory: Positive for shortness of breath.   Cardiovascular: Positive for chest pain. Negative for leg swelling.  Gastrointestinal: Negative for abdominal pain.  Genitourinary: Negative for dysuria.  Musculoskeletal: Negative for back pain.  Neurological: Negative for headaches.  Hematological: Does not bruise/bleed easily.  Psychiatric/Behavioral: Negative for confusion.     Physical Exam Updated Vital Signs BP 109/72   Pulse 85   Temp 98.2 F (36.8 C) (Oral)   Resp 15   Ht 1.88 m (6\' 2" )   Wt 88.5 kg (195 lb)   SpO2 94%   BMI 25.04  kg/m   Physical Exam  Constitutional: He is oriented to person, place, and time. He appears well-developed and well-nourished. No distress.  HENT:  Head: Normocephalic and atraumatic.  Mouth/Throat: Oropharynx is clear and moist.  Eyes: Conjunctivae and EOM are normal. Pupils are equal, round, and reactive to light.  Neck: Normal range of motion. Neck supple.  Pulmonary/Chest: Effort normal and breath sounds normal. He has no wheezes.  Abdominal: Soft. Bowel sounds are normal. There is no tenderness.  Musculoskeletal: He exhibits no edema.  Neurological: He is alert and oriented to person, place, and time. No cranial nerve deficit or sensory deficit. He exhibits normal muscle tone. Coordination normal.  Skin: Skin is warm.  Nursing note and vitals reviewed.    ED Treatments / Results  Labs (all labs ordered are listed, but only abnormal results are displayed) Labs Reviewed  BASIC METABOLIC PANEL - Abnormal; Notable for the following components:      Result Value   Glucose, Bld 110 (*)    BUN 21 (*)    All other components within normal limits  CBC - Abnormal; Notable for the following components:   WBC 12.0 (*)    All other components within normal limits  I-STAT TROPONIN, ED  I-STAT TROPONIN, ED    EKG  EKG Interpretation  Date/Time:  Tuesday October 15 2017 03:48:54 EST Ventricular Rate:  86 PR Interval:  186 QRS Duration: 94 QT Interval:  346 QTC Calculation: 414 R Axis:   -41 Text Interpretation:  Normal sinus rhythm Possible Left atrial enlargement Left axis deviation Low voltage QRS Inferior infarct , age undetermined Anterolateral infarct , age undetermined Abnormal ECG When compared with ECG of EARLIER SAME DATE No significant change was found Confirmed by Dione Booze ([id number]) on 10/15/2017 3:59:28 AM       Radiology Dg Chest 2 View  Result Date: 10/15/2017 CLINICAL DATA:  Left-sided chest pain.  Shortness of breath. EXAM: CHEST  2 VIEW COMPARISON:   None. FINDINGS: The cardiomediastinal contours are normal. The lungs are clear. Pulmonary vasculature is normal. No consolidation, pleural effusion, or pneumothorax. No acute osseous abnormalities are seen. IMPRESSION: No acute pulmonary process. Electronically Signed   By: Rubye Oaks M.D.   On: 10/15/2017 04:18   Ct Angio Chest Pe W/cm &/or Wo Cm  Result Date: 10/15/2017 CLINICAL DATA:  66 year old male with chest pain radiating to the left from the mid sternum since 2300 hours. EXAM: CT ANGIOGRAPHY CHEST WITH CONTRAST TECHNIQUE: Multidetector CT imaging of the chest was performed using the standard protocol during bolus administration of intravenous contrast. Multiplanar CT image reconstructions and MIPs were obtained to evaluate the vascular anatomy. CONTRAST:  ISOVUE-370 IOPAMIDOL (ISOVUE-370) INJECTION 76% COMPARISON:  Chest radiographs 0402 hours today. FINDINGS: Cardiovascular:  Good contrast bolus timing in the pulmonary arterial tree. Positive filling defects at the arborization of the left pulmonary artery lingula and lower lobe branches. There is a combination of both nonocclusive (series 5, image 135) and occlusive (image 151) pulmonary thrombotic disease. The left upper lobe is spared. No central or saddle pulmonary embolus. No right lung pulmonary embolus identified. Calcified coronary artery atherosclerosis and stents. Mild cardiomegaly. No pericardial effusion. No aortic contrast on this study. Mediastinum/Nodes: Mild bilateral hilar lymph node enlargement greater on the right. No mediastinal lymphadenopathy. Lungs/Pleura: Trace layering left pleural effusion. Major airways are patent. There is patchy peribronchial, distal, and dependent opacity in both lower lobes. Opacity in the lateral basal segment of the left lower lobe is most suspicious for pulmonary infarct. Upper Abdomen: Simple fluid density and benign appearing 3.7 cm partially exophytic cyst in the right renal upper pole.  Negative visualized noncontrast liver, gallbladder, spleen, pancreas, adrenal glands, left kidney, and bowel in the upper abdomen. Musculoskeletal: No acute osseous abnormality identified. Review of the MIP images confirms the above findings. IMPRESSION: 1. Positive for multifocal left lung pulmonary embolus. No central or saddle embolus. 2. Critical Value/emergent results were called by telephone at the time of interpretation on 10/15/2017 at 11:12 am to Dr. Vanetta Mulders , who verbally acknowledged these results. 3. Possible left lower lobe pulmonary infarcts. Trace left pleural effusion. Additional nonspecific bilateral lower lobe patchy opacity. 4. Calcified coronary artery atherosclerosis and stents. Mild cardiomegaly. No pericardial effusion. Electronically Signed   By: Odessa Fleming M.D.   On: 10/15/2017 11:14    Procedures Procedures (including critical care time)  CRITICAL CARE Performed by: Vanetta Mulders Total critical care time: 30 minutes Critical care time was exclusive of separately billable procedures and treating other patients. Critical care was necessary to treat or prevent imminent or life-threatening deterioration. Critical care was time spent personally by me on the following activities: development of treatment plan with patient and/or surrogate as well as nursing, discussions with consultants, evaluation of patient's response to treatment, examination of patient, obtaining history from patient or surrogate, ordering and performing treatments and interventions, ordering and review of laboratory studies, ordering and review of radiographic studies, pulse oximetry and re-evaluation of patient's condition.    Medications Ordered in ED Medications  nitroGLYCERIN (NITROSTAT) SL tablet 0.4 mg (0.4 mg Sublingual Given 10/15/17 1010)  0.9 %  sodium chloride infusion ( Intravenous New Bag/Given 10/15/17 1002)  heparin ADULT infusion 100 units/mL (25000 units/220mL sodium chloride 0.45%)  (1,500 Units/hr Intravenous New Bag/Given 10/15/17 1141)  aspirin chewable tablet 324 mg (324 mg Oral Given 10/15/17 0957)  iopamidol (ISOVUE-370) 76 % injection (100 mLs  Contrast Given 10/15/17 1000)  heparin injection 4,000 Units (4,000 Units Intravenous Given 10/15/17 1142)     Initial Impression / Assessment and Plan / ED Course  I have reviewed the triage vital signs and the nursing notes.  Pertinent labs & imaging results that were available during my care of the patient were reviewed by me and considered in my medical decision making (see chart for details).     Workup troponins x2-.  Makin is unlikely to be an unstable angina picture.  However patient does have a history of delayed increase in troponins.  CT angios was done because of the pleuritic nature of the chest pain although oxygen saturations were fine.  And CT shows left-sided PE.  This may or may not explain all of his pain symptoms.  Also possible that the PE could have  occurred several hours or days ago and now farting long as what is causing the pain.  Patient will be started on heparin.  Patient will be admitted by unassigned medicine admission.  Recommending continuing cardiac markers just to be on safe side.  Patient is followed by cardiology currently now at [facility] by Dr. Dulce SellarMunley.  Final Clinical Impressions(s) / ED Diagnoses   Final diagnoses:  Precordial pain  Other acute pulmonary embolism without acute cor pulmonale [facility](HCC)    ED Discharge Orders    None       Vanetta MuldersZackowski, Venancio Chenier, MD 10/15/17 1203

## 2017-10-15 NOTE — Progress Notes (Signed)
Late entry. Saw patient ~1500. He was having chest pain that was not improving after nitro. Worsened with deep inspiration. Placed order for morphine. Discussed need to get outside EKG for comparison. Wife signed release, and record request faxed to [facility]. Per cards, would not need to see just for EKG changes, but ECHO recommended to fully rule-out heart strain. Per patient's cardiologist's last note, he was considering ECHO at next visit, as well. Order for ECHO placed. Family concerned about insurance covering xarelto. Will place CM consult to see what kind of assistance he may qualify for. Troponins have been negative x 3. Will observe overnight to work on pain management and complete work-up.   Dani Gobble, MD [facility], PGY-3

## 2017-10-15 NOTE — ED Notes (Signed)
ED Provider at bedside. 

## 2017-10-15 NOTE — ED Notes (Signed)
Lunch tray at bedside. ?

## 2017-10-15 NOTE — ED Notes (Signed)
Pt given saltine crackers and graham crackers until tray arrives.

## 2017-10-15 NOTE — ED Notes (Signed)
Pt reports no CP at this time, does not want ordered prn morphine

## 2017-10-15 NOTE — ED Notes (Addendum)
Admitting at the bedside. Per admitting, will order pain medication for pt.

## 2017-10-15 NOTE — ED Triage Notes (Signed)
Pt states that he began to have CP around 1130, L sided, SOB, no radiation, denies n/v. Pt took one nitro with some relief

## 2017-10-15 NOTE — Plan of Care (Signed)
  Clinical Measurements: Ability to maintain clinical measurements within normal limits will improve 10/15/2017 2235 - Progressing by Sharen Youngren, Marlana Salvage, RN   Clinical Measurements: Will remain free from infection 10/15/2017 2235 - Progressing by Sheryle Hail, RN   Clinical Measurements: Diagnostic test results will improve 10/15/2017 2235 - Progressing by Marylee Belzer, Marlana Salvage, RN

## 2017-10-15 NOTE — ED Notes (Signed)
Tray ordered for pt.

## 2017-10-16 ENCOUNTER — Observation Stay (HOSPITAL_BASED_OUTPATIENT_CLINIC_OR_DEPARTMENT_OTHER): Payer: PPO

## 2017-10-16 DIAGNOSIS — I2699 Other pulmonary embolism without acute cor pulmonale: Secondary | ICD-10-CM | POA: Diagnosis not present

## 2017-10-16 DIAGNOSIS — R079 Chest pain, unspecified: Secondary | ICD-10-CM | POA: Diagnosis not present

## 2017-10-16 LAB — ECHOCARDIOGRAM COMPLETE
Height: 74 in
WEIGHTICAEL: 3206.4 [oz_av]

## 2017-10-16 MED ORDER — RIVAROXABAN 15 MG PO TABS: 15.0000 mg | ORAL_TABLET | Freq: Two times a day (BID) | ORAL | 0 refills | Status: DC

## 2017-10-16 MED ORDER — TRAMADOL-ACETAMINOPHEN 37.5-325 MG PO TABS
1.0000 | ORAL_TABLET | ORAL | Status: DC | PRN
  Administered 2017-10-16: 1 via ORAL
  Filled 2017-10-16: qty 1

## 2017-10-16 MED ORDER — TRAMADOL-ACETAMINOPHEN 37.5-325 MG PO TABS: 1.0000 | ORAL_TABLET | ORAL | 0 refills | Status: DC | PRN

## 2017-10-16 NOTE — Progress Notes (Signed)
Benefit check for Xarelto  Derek Hart, Derek Hart CMA         Xarelto 15mg  BID:  Test claim through [facility]  For 30 day: patient is in coverage gap- patient will have $80 co-pay (35% of the total cost of medication)

## 2017-10-16 NOTE — Progress Notes (Signed)
MD Janee Morn called back. Informed MD there was still no consult for cardiology in the chart. Informed MD pt wanting to be discharged. MD stated "I was told there was a consult but either way he will be discharged by 600pm"

## 2017-10-16 NOTE — Progress Notes (Signed)
Pt requesting to speak to MD Pt stated MD informed he would be discharged today.  MD called back stated pt will be discharged once cardiology sees pt at bedside  Informed pt and pt family

## 2017-10-16 NOTE — Progress Notes (Signed)
Pt reporting pain 4/10 and stated he informed MD. Pt refuses tylenol and stated MD told pt he would order tramadol. Paged MD to inform pt only has IV morphine ordered. MD stated he will adjust and change orders

## 2017-10-16 NOTE — Discharge Instructions (Signed)
You also had an echo and found to have a left ventricular ejection fraction of 35-40% and need close follow up with Cardiology.  Bleeding Precautions When on Anticoagulant Therapy WHAT IS ANTICOAGULANT THERAPY? Anticoagulant therapy is taking medicine to prevent or reduce blood clots. It is also called blood thinner therapy. Blood clots that form in your blood vessels can be dangerous. They can break loose and travel to your heart, lungs, or brain. This increases your risk of a heart attack or stroke. Anticoagulant therapy causes blood to clot more slowly. You may need anticoagulant therapy if you have:  A medical condition that increases the likelihood that blood clots will form.  A heart defect or a problem with heart rhythm. It is also a common treatment after heart surgery, such as valve replacement. WHAT ARE COMMON TYPES OF ANTICOAGULANT THERAPY? Anticoagulant medicine can be injected or taken by mouth.If you need anticoagulant therapy quickly at the hospital, the medicine may be injected under your skin or given through an IV tube. Heparin is a common example of an anticoagulant that you may get at the hospital. Most anticoagulant therapy is in the form of pills that you take at home every day. These may include:  Aspirin. This common blood thinner works by preventing blood cells (platelets) from sticking together to form a clot. Aspirin is not as strong as anticoagulants that slow down the time that it takes for your body to form a clot.  Clopidogrel. This is a newer type of drug that affects platelets. It is stronger than aspirin.  Warfarin. This is the most common anticoagulant. It changes the way your body uses vitamin K, a vitamin that helps your blood to clot. The risk of bleeding is higher with warfarin than with aspirin. You will need frequent blood tests to make sure you are taking the safest amount.  New anticoagulants. Several new drugs have been approved. They are all taken by  mouth. Studies show that these drugs work as well as warfarin. They do not require blood testing. They may cause less bleeding risk than warfarin. WHAT DO I NEED TO REMEMBER WHEN TAKING ANTICOAGULANT THERAPY? Anticoagulant therapy decreases your risk of forming a blood clot, but it increases your risk of bleeding. Work closely with your health care provider to make sure you are taking your medicine safely. These tips can help:  Learn ways to reduce your risk of bleeding.  If you are taking warfarin: ? Have blood tests as ordered by your health care provider. ? Do not make any sudden changes to your diet. Vitamin K in your diet can make warfarin less effective. ? Do not get pregnant. This medicine may cause birth defects.  Take your medicine at the same time every day. If you forget to take your medicine, take it as soon as you remember. If you miss a whole day, do not double your dose of medicine. Take your normal dose and call your health care provider to check in.  Do not stop taking your medicine on your own.  Tell your health care provider before you start taking any new medicine, vitamin, or herbal product. Some of these could interfere with your therapy.  Tell all of your health care providers that you are on anticoagulant therapy.  Do not have surgery, medical procedures, or dental work until you tell your health care provider that you are on anticoagulant therapy. WHAT CAN AFFECT HOW ANTICOAGULANTS WORK? Certain foods, vitamins, medicines, supplements, and herbal medicines change the  way that anticoagulant therapy works. They may increase or decrease the effects of your anticoagulant therapy. Either result can be dangerous for you.  Many over-the-counter medicines for pain, colds, or stomach problems interfere with anticoagulant therapy. Take these only as told by your health care provider.  Do not drink alcohol. It can interfere with your medicine and increase your risk of an injury  that causes bleeding.  If you are taking warfarin, do not begin eating more foods that contain vitamin K. These include leafy green vegetables. Ask your health care provider if you should avoid any foods. WHAT ARE SOME WAYS TO PREVENT BLEEDING? You can prevent bleeding by taking certain precautions:  Be extra careful when you use knives, scissors, or other sharp objects.  Use an electric razor instead of a blade.  Do not use toothpicks.  Use a soft toothbrush.  Wear shoes that have nonskid soles.  Use bath mats and handrails in your bathroom.  Wear gloves while you do yard work.  Wear a helmet when you ride a bike.  Wear your seat belt.  Prevent falls by removing loose rugs and extension cords from areas where you walk.  Do not play contact sports or participate in other activities that have a high risk of injury. WHEN SHOULD I CONTACT MY HEALTH CARE PROVIDER? Call your health care provider if:  You miss a dose of medicine: ? And you are not sure what to do. ? For more than one day.  You have: ? Menstrual bleeding that is heavier than normal. ? Blood in your urine. ? A bloody nose or bleeding gums. ? Easy bruising. ? Blood in your stool (feces) or have black and tarry stool. ? Side effects from your medicine.  You feel weak or dizzy.  You become pregnant. Seek immediate medical care if:  You have bleeding that will not stop.  You have sudden and severe headache or belly pain.  You vomit or you cough up bright red blood.  You have a severe blow to your head. WHAT ARE SOME QUESTIONS TO ASK MY HEALTH CARE PROVIDER?  What is the best anticoagulant therapy for my condition?  What side effects should I watch for?  When should I take my medicine? What should I do if I forget to take it?  Will I need to have regular blood tests?  Do I need to change my diet? Are there foods or drinks that I should avoid?  What activities are safe for me?  What should I do  if I want to get pregnant? This information is not intended to replace advice given to you by your health care provider. Make sure you discuss any questions you have with your health care provider. Document Released: 10/17/2015 Document Reviewed: 10/17/2015 Elsevier Interactive Patient Education  2017 [employer].   Information on my medicine - XARELTO (rivaroxaban)  This medication education was reviewed with me or my healthcare representative as part of my discharge preparation.   WHY WAS XARELTO PRESCRIBED FOR YOU? Xarelto was prescribed to treat blood clots that may have been found in the veins of your legs (deep vein thrombosis) or in your lungs (pulmonary embolism) and to reduce the risk of them occurring again.  What do you need to know about Xarelto? The starting dose is one 15 mg tablet taken TWICE daily with food for the FIRST 21 DAYS then on 11/06/2017  the dose is changed to one 20 mg tablet taken ONCE A DAY with  your evening meal.  DO NOT stop taking Xarelto without talking to the health care provider who prescribed the medication.  Refill your prescription for 20 mg tablets before you run out.  After discharge, you should have regular check-up appointments with your healthcare provider that is prescribing your Xarelto.  In the future your dose may need to be changed if your kidney function changes by a significant amount.  What do you do if you miss a dose? If you are taking Xarelto TWICE DAILY and you miss a dose, take it as soon as you remember. You may take two 15 mg tablets (total 30 mg) at the same time then resume your regularly scheduled 15 mg twice daily the next day.  If you are taking Xarelto ONCE DAILY and you miss a dose, take it as soon as you remember on the same day then continue your regularly scheduled once daily regimen the next day. Do not take two doses of Xarelto at the same time.   Important Safety Information Xarelto is a blood thinner  medicine that can cause bleeding. You should call your healthcare provider right away if you experience any of the following: ? Bleeding from an injury or your nose that does not stop. ? Unusual colored urine (red or dark brown) or unusual colored stools (red or black). ? Unusual bruising for unknown reasons. ? A serious fall or if you hit your head (even if there is no bleeding).  Some medicines may interact with Xarelto and might increase your risk of bleeding while on Xarelto. To help avoid this, consult your healthcare provider or pharmacist prior to using any new prescription or non-prescription medications, including herbals, vitamins, non-steroidal anti-inflammatory drugs (NSAIDs) and supplements.  This website has more information on Xarelto: VisitDestination.com.br.

## 2017-10-16 NOTE — Progress Notes (Signed)
  Echocardiogram 2D Echocardiogram has been performed.  Derek Hart 10/16/2017, 11:30 AM

## 2017-10-16 NOTE — Progress Notes (Signed)
Family Medicine Teaching Service Daily Progress Note Intern Pager: [phone]  Patient name: Derek Hart Medical record number: [id number] Date of birth: 1951-10-02 Age: 66 y.o. Gender: male  Primary Care Provider: Gordan Payment., MD Consultants: Cardiology Code Status: Full  Pt Overview and Major Events to Date:  Derek Hart is a 66 y.o. male presenting with pleuritic chest pain found to have pulmonary embolus (provoked). PMH is significant for CAD with stenting in 2015 and stent in distal LAD in 01/2017, HTN, HLD, COPD.   Assessment and Plan:  Left Pleuritic Chest Pain 2/2 Pulmonary Embolus, Provoked: CTA chest with multifocal Left lung PE without signs of right heart strain. Vitals are stable without supplemental oxygen requirement. Provoked due to long car ride about 1 week ago. Troponins were negative. EKG with T wave inversion in V1-V4 that stayed unchanged since admission. Unable to view prior EKGs in care everywhere and we do not have any in our system to compare. Unlikely cardiac related due to the atypical nature of symptoms, that it is likely due to PE diagnosed on imaging, and istat troponins are negative x 2. - Xarelto for 3 months.  - EKG is unchanged since admission.  CAD with history of stents: Stenting in 2015 and stent in distal LAD in 01/2017. - continue Coreg 12.5mg  BID - received ASA 325mg  today, resume home ASA 81mg  daily tomorrow - continue home Plavix 75mg  daily - continue home Lipitor 40mg  daily  CHF (HFrEF): Patient has reduced ejection fraction of 35-40% EF.  - Consult with HF Cardiology specialist - Already on Carvedilol 12.5mg  BID - Should be started on an ACEI/ARB, already on ramipril.  HTN: BP stable, but last 2 were elevated. - continue Coreg - restart ramipril  COPD:  - continue home Dulera and singulair   GERD:  - due to formulary, Protonix while in the hospital  FEN/GI: heart healthy diet PPx: Lovenox  Disposition: possible d/c  today  Subjective:  Derek Hart is still having some lower left sided chest pain but states it has improved since admission yesterday. He states morphine has helped. He has no other complaints this morning and denies chest pain, palpitations, shortness of breath, or difficulty breathing.  Objective: Temp:  [98.4 F (36.9 C)-99.1 F (37.3 C)] 98.5 F (36.9 C) (11/28 0448) Pulse Rate:  [70-95] 89 (11/28 0448) Resp:  [12-25] 20 (11/28 0448) BP: (103-133)/(69-92) 128/81 (11/28 0448) SpO2:  [93 %-98 %] 95 % (11/28 0448) Weight:  [200 lb 6.4 oz (90.9 kg)-201 lb 1.6 oz (91.2 kg)] 200 lb 6.4 oz (90.9 kg) (11/28 0340) Physical Exam: General: A&O x 3; NAD Cardiovascular: RRR, normal S1, S2, no murmurs Respiratory: CTAB, no wheezes, no crackles, comfortable work of breathing Abdomen: non-distendedsoft, non-tender, +bs Extremities: no edema, no clubbing, no cyanosis  Laboratory: Recent Labs  Lab 10/15/17 0336  WBC 12.0*  HGB 14.9  HCT 44.6  PLT 203   Recent Labs  Lab 10/15/17 0336  NA 139  K 4.4  CL 104  CO2 28  BUN 21*  CREATININE 1.13  CALCIUM 9.1  GLUCOSE 110*   Troponin I: 0.00, 0.01, < 0.03  Imaging/Diagnostic Tests:  11/27 - EKG: NSR, Left axis deviation possible old anterolateral and inferior infarct  11/28 - ECHO: LVEF: 35-40%, mild LV dilation, mild LA dilation  11/28 - EKG: NSR, Left axis deviation possible old anterolateral and inferior infarct  Arlyce Harman, DO 10/16/2017, 8:25 AM PGY-1, [facility] FPTS Intern pager: [phone], text pages welcome

## 2017-10-16 NOTE — Progress Notes (Signed)
Pt refusing IV fluids  Pt discontinued

## 2017-10-16 NOTE — Progress Notes (Signed)
Stopped cardiology, MD Varinassi in the hallway. MD stated he was not consulted. Paged MD Janee Morn to inform. Pt wanting to be discharged ASAP. Awaiting call back

## 2017-10-17 DIAGNOSIS — I2699 Other pulmonary embolism without acute cor pulmonale: Secondary | ICD-10-CM | POA: Diagnosis not present

## 2017-10-17 MED ORDER — RIVAROXABAN (XARELTO) EDUCATION KIT FOR DVT/PE PATIENTS
PACK | Freq: Once | Status: DC
  Filled 2017-10-17: qty 1

## 2017-10-17 NOTE — Progress Notes (Signed)
Family Medicine Teaching Service Daily Progress Note Intern Pager: [phone]  Patient name: Derek Hart Medical record number: [id number] Date of birth: 03-24-1951 Age: 66 y.o. Gender: male  Primary Care Provider: Gordan Payment., MD Consultants: Cardiology Code Status: Full  Pt Overview and Major Events to Date:  Derek Hart is a 66 y.o. male presenting with pleuritic chest pain found to have pulmonary embolus (provoked). PMH is significant for CAD with stenting in 2015 and stent in distal LAD in 01/2017, HTN, HLD, COPD.   Assessment and Plan:  Left Pleuritic Chest Pain 2/2 Pulmonary Embolus, Provoked: CTA chest with multifocal Left lung PE without signs of right heart strain. Vitals are stable without supplemental oxygen requirement. Provoked due to long car ride about 1 week ago. Troponins were negative. EKG with T wave inversion in V1-V4 that stayed unchanged since admission. Unable to view prior EKGs in care everywhere and we do not have any in our system to compare. Unlikely cardiac related due to the atypical nature of symptoms, that it is likely due to PE diagnosed on imaging, and istat troponins are negative x 2. - Xarelto for 21 days at 15mg  BID. Then continue at 20mg  daily for a total of 3 months. - EKG is unchanged since admission.  CAD with history of stents: Stenting in 2015 and stent in distal LAD in 01/2017. - continue Coreg 12.5mg  BID - received ASA 325mg  today, resume home ASA 81mg  daily tomorrow - continue home Plavix 75mg  daily - continue home Lipitor 40mg  daily  CHF (HFrEF): Patient has reduced ejection fraction of 35-40% EF.  - Consult with HF Cardiology specialist was placed but patient eager to be discharged so informed patient he needs to follow up with Cardiology closely for outpatient care. - Already on Carvedilol 12.5mg  BID - Already on ramipril, restarting home dose.  HTN: BP stable, but last 2 were elevated. - continue Coreg - restart  ramipril  COPD:  - continue home Dulera and singulair   GERD:  - due to formulary, Protonix while in the hospital  FEN/GI: heart healthy diet PPx: Lovenox  Disposition:  d/c today  Subjective:  Derek Hart states he is feeling well with no complaints today.  Objective: Temp:  [97.5 F (36.4 C)-98 F (36.7 C)] 97.8 F (36.6 C) (11/29 0644) Pulse Rate:  [72-96] 72 (11/29 0644) Resp:  [18-20] 18 (11/29 0644) BP: (133-165)/(65-92) 141/92 (11/29 0644) SpO2:  [94 %-100 %] 95 % (11/29 0644) Physical Exam: Gen: Alert and Oriented x 3, NAD CV: RRR, no murmurs, normal S1, S2 split Resp: CTAB, no wheezing, rales, or rhonchi, comfortable work of breathing Ext: no clubbing, cyanosis, or edema Skin: warm, dry, intact, no rashes  Laboratory: Recent Labs  Lab 10/15/17 0336  WBC 12.0*  HGB 14.9  HCT 44.6  PLT 203   Recent Labs  Lab 10/15/17 0336  NA 139  K 4.4  CL 104  CO2 28  BUN 21*  CREATININE 1.13  CALCIUM 9.1  GLUCOSE 110*   Troponin I: 0.00, 0.01, < 0.03  Imaging/Diagnostic Tests:  11/27 - EKG: NSR, Left axis deviation possible old anterolateral and inferior infarct  11/28 - ECHO: LVEF: 35-40%, mild LV dilation, mild LA dilation  11/28 - EKG: NSR, Left axis deviation possible old anterolateral and inferior infarct  Arlyce Harman, DO 10/17/2017, 7:22 AM PGY-1, [facility] Family [facility] FPTS Intern pager: [phone], text pages welcome

## 2017-10-17 NOTE — Discharge Summary (Signed)
Family Medicine Teaching [facility] Discharge Summary  Patient name: Derek Hart Medical record number: [id number] Date of birth: September 04, 1951 Age: 66 y.o. Gender: male Date of Admission: 10/15/2017  Date of Discharge: 10/17/2017 Admitting Physician: Carney Living, MD  Primary Care Provider: Gordan Payment., MD Consultants: None  Indication for Hospitalization:   Chest pain and shortness of breath  Discharge Diagnoses/Problem List:   Left Pulmonary Embolism HF with reduced ejection fraction COPD CAD HTN  Disposition: home  Discharge Condition: stable  Discharge Exam:   Gen: Alert and Oriented x 3, NAD HEENT: Normocephalic, atraumatic, PERRLA, EOMI Neck: trachea midline, no thyroidmegaly, no LAD  CV: RRR, no murmurs, normal S1, S2 split, +2 pulses dorsalis pedis bilaterally Resp: CTAB, no wheezing, rales, or rhonchi, comfortable work of breathing Abd: non-distended, non-tender, soft, +bs in all four quadrants, no hepatomegaly MSK: FROM in all four extremities Ext: no clubbing, cyanosis, or edema Skin: warm, dry, intact, no rashes  Brief Hospital Course:  Mr. Nygard is a 66y/o male with a PMH of CAD, COPD, HTN, HLD who was admitted on 11/27 due to chest pain and EKG that showed possible old anterolateral and inferior infarcts. Patient reported the pain to be more pleuritic in nature and endorsed taking a long car ride the week before. His vital signs remained stable and his troponin's were negative. A repeat EKG showed no changes. A CT Angio Chest was performed and was significant for a multifocal left pulmonary embolus. He was started on Xarelto and needed minimal pain medication for his chest pain. His vitals remained stable and an echocardiogram showed a slightly decreased LVEF of 35-40%. His chest pain improved and he expressed a desire to go home. He had no chest pain, SOB, difficulty breathing, or desats on day of discharge on 11/29.  Issues for Follow Up:   1. He was on ASA and Plavix due to stent placement on 01/2017. I have temporarily stopped his Plavix due to starting Xarelto for his PE. I know his Xarelto is not an antiplatelet so it would not replace Plavix. However, I was concerned about his bleeding risk but a Cardiology consult on recs on whether or not he should take Plavix and Xarelto would be appreciated. 2. Echo while in the hospital showed a declined LVEF of 35-40% which was decreased from a previous LVEF of 45-50% from Echo on 01/2017. Optimal therapy is an ACEI/ARB and a Beta-Blocker. He is on Ramipril 5mg  daily and Carvedilol 12.5mg  BID. 3. He will need to be on Xarelto 15mg  BID for 21 days. I have supplied him with this medication. He will need to be on anticoagulation for 3 months after he has finished his first 21 days for his first unprovoked PE. It is standard care to have him take Xarelto for 3 months 20mg  daily. 4. He would benefit from close Cardiology follow up for further management of his HFrEF.  Significant Procedures:   Echocardiogram CT Angio - significant for PE CXR  Significant Labs and Imaging:  Recent Labs  Lab 10/15/17 0336  WBC 12.0*  HGB 14.9  HCT 44.6  PLT 203   Recent Labs  Lab 10/15/17 0336  NA 139  K 4.4  CL 104  CO2 28  GLUCOSE 110*  BUN 21*  CREATININE 1.13  CALCIUM 9.1   Troponin I: <0.03 EKG: NSR. possible previous anterolateral and inferior infarcts. CT Angio Chest: IMPRESSION: 1. Positive for multifocal left lung pulmonary embolus. No central or saddle embolus. 3. Possible left  lower lobe pulmonary infarcts. Trace left pleural effusion. Additional nonspecific bilateral lower lobe patchy opacity. 4. Calcified coronary artery atherosclerosis and stents. Mild cardiomegaly. No pericardial effusion.  Results/Tests Pending at Time of Discharge: None  Discharge Medications:  Allergies as of 10/17/2017      Reactions   Pneumovax 23 [pneumococcal Vac Polyvalent] Rash       Medication List    TAKE these medications   albuterol 108 (90 Base) MCG/ACT inhaler Commonly known as:  PROVENTIL HFA;VENTOLIN HFA Inhale 1 puff into the lungs every 6 (six) hours as needed for wheezing or shortness of breath.   aspirin 81 MG chewable tablet Chew 81 mg by mouth daily.   atorvastatin 80 MG tablet Commonly known as:  LIPITOR Take 80 mg by mouth every evening.   carvedilol 12.5 MG tablet Commonly known as:  COREG Take 12.5 mg by mouth 2 (two) times daily.   DULERA IN Inhale 2 puffs into the lungs at bedtime.   montelukast 10 MG tablet Commonly known as:  SINGULAIR Take 10 mg by mouth daily.   nitroGLYCERIN 0.4 MG SL tablet Commonly known as:  NITROSTAT Place 0.4 mg under the tongue every 5 (five) minutes as needed for chest pain.   omeprazole 40 MG capsule Commonly known as:  PRILOSEC Take 40 mg by mouth daily.   ramipril 5 MG capsule Commonly known as:  ALTACE Take 5 mg by mouth daily.   Rivaroxaban 15 MG Tabs tablet Commonly known as:  XARELTO Take 1 tablet (15 mg total) by mouth 2 (two) times daily with a meal for 20 days.   traMADol-acetaminophen 37.5-325 MG tablet Commonly known as:  ULTRACET Take 1 tablet by mouth every 4 (four) hours as needed for moderate pain.       Discharge Instructions: Please refer to Patient Instructions section of EMR for full details.  Patient was counseled important signs and symptoms that should prompt return to medical care, changes in medications, dietary instructions, activity restrictions, and follow up appointments.   Follow-Up Appointments: Follow-up Information    Gordan PaymentGrisso, Greg A., MD. Nyra CapesGo on 10/21/2017.   Specialty:  Internal Medicine Why:  @2 :20pm Contact information: [address] [place] [address] [phone]           Arlyce HarmanLockamy, Thom Ollinger, DO 10/17/2017, 9:17 AM PGY-1, [facility]

## 2017-10-17 NOTE — Progress Notes (Signed)
Pt decided he was not leaving on 10/16/17 due to delay in d/c orders.  Informed this writer he would leave am 10/17/17.

## 2017-10-18 DIAGNOSIS — I213 ST elevation (STEMI) myocardial infarction of unspecified site: Secondary | ICD-10-CM

## 2017-10-18 HISTORY — DX: ST elevation (STEMI) myocardial infarction of unspecified site: I21.3

## 2017-10-21 DIAGNOSIS — Z09 Encounter for follow-up examination after completed treatment for conditions other than malignant neoplasm: Secondary | ICD-10-CM | POA: Diagnosis not present

## 2017-10-21 DIAGNOSIS — Z7901 Long term (current) use of anticoagulants: Secondary | ICD-10-CM | POA: Insufficient documentation

## 2017-10-21 DIAGNOSIS — I10 Essential (primary) hypertension: Secondary | ICD-10-CM | POA: Diagnosis not present

## 2017-10-21 DIAGNOSIS — I2699 Other pulmonary embolism without acute cor pulmonale: Secondary | ICD-10-CM | POA: Diagnosis not present

## 2017-10-21 DIAGNOSIS — I2510 Atherosclerotic heart disease of native coronary artery without angina pectoris: Secondary | ICD-10-CM | POA: Diagnosis not present

## 2017-10-21 HISTORY — DX: Long term (current) use of anticoagulants: Z79.01

## 2017-10-21 NOTE — Progress Notes (Signed)
Cardiology Office Note:    Date:  10/25/2017   ID:  Derek Hart, DOB 1951-07-09, MRN [id number]  PCP:  Gordan Payment., MD  Cardiologist:  Norman Herrlich, MD    Referring MD: Gordan Payment., MD    ASSESSMENT:    1. Ischemic cardiomyopathy   2. Chronic anticoagulation   3. Coronary artery disease involving native coronary artery of native heart with angina pectoris (HCC)   4. Mixed hyperlipidemia   5. Essential hypertension    PLAN:    In order of problems listed above:  1. He has had a significant decrease from his baseline ejection fraction in the absence of clinical heart failure.  I asked him to start vasodilator therapy switching from ACE inhibitor to ARNI and advised him undergo an ischemic evaluation.  He declined an ischemia evaluation at this time and opts for medical treatment and reassessment of ejection fraction in 8-12 weeks.  If further decrease less than 35 he need to be considered for ICD implantation with CAD and previous MI 2. Continue his current anticoagulation for pulmonary embolism was unprovoked and I think he should remain on indefinite anticoagulation he is taking both aspirin as well as clopidogrel and will stop his aspirin. 3. Continue current medical treatment after discussion he opts not to undergo an ischemia evaluation. 4. Continue with statin 5. Stable continue current treatment with switch from ACE inhibitor to quality with significant reduction in ejection fraction CAD and cardiomyopathy   Next appointment: 6 weeks   Medication Adjustments/Labs and Tests Ordered: Current medicines are reviewed at length with the patient today.  Concerns regarding medicines are outlined above.  No orders of the defined types were placed in this encounter.  Meds ordered this encounter  Medications  . sacubitril-valsartan (ENTRESTO) 24-26 MG    Sig: Take 1 tablet by mouth 2 (two) times daily. Do not take until you are off ramipril for 3 full days    Dispense:   60 tablet    Refill:  1  . carvedilol (COREG) 12.5 MG tablet    Sig: Take 1.5 tablets (18.75 mg total) by mouth 2 (two) times daily. 1 1/2 twice daily    Dispense:  270 tablet    Refill:  3    Chief Complaint  Patient presents with  . Cardiomyopathy  . Coronary Artery Disease  . Follow-up    recent admission with PE and low EF 35-40%    History of Present Illness:    Derek Hart is a 66 y.o. male with a hx of CAD and old MI, Stents to LAD and RCA, Cir in 2015, and to distal LAD 01/2017 after a NSTEMI, mild LV dysfunction with EF 45% in March 2018, dyslipidemia and COPD last seen in April 2018.  Recent [facility] admission 10/15/17 to 10/17/17 Discharge Diagnoses/Problem List:  Pulmonary Embolism HF with reduced ejection fraction COPD CAD HTN  Echo 10/16/17: Study Conclusions - Left ventricle: Septal and apical hypokinesis. The cavity size   was moderately dilated. Wall thickness was increased in a pattern   of moderate LVH. Systolic function was moderately reduced. The   estimated ejection fraction was in the range of 35% to 40%. Left   ventricular diastolic function parameters were normal. - Left atrium: The atrium was mildly dilated. - Atrial septum: No defect or patent foramen ovale was identified.  Compliance with diet, lifestyle and medications: Yes  He recently presented with unprovoked pulmonary embolism.  He is anticoagulated and since that time he  has had no further chest pain shortness of breath palpitations syncope or TIA.  Echocardiogram shows significant reduction from baseline LV dysfunction EF now in the range of 35-40% without overt heart failure.  With unprovoked pulmonary embolism and brought up the issue of cancer screening he is somewhat fatalistic and declines to consider things like colonoscopy.  Past Medical History:  Diagnosis Date  . Allergic rhinitis 07/19/2016  . Asthma 07/19/2016  . Atherosclerotic heart disease of native coronary artery without  angina pectoris 07/28/2015    CAD/ MI/ CABG  Overview:  [place].  MI LAD stent. 01/2017 Overview:  He had MI with staged PCI of LAD and later RCA and LCF 2015, EF 40% Overview:  Added automatically from request for surgery [id number]  . Cardiomyopathy (HCC) 07/19/2016   Overview:  EF 45% or 54% 01/2017  . Chest pain in adult 02/01/2017  . Chronic bronchitis (HCC)   . COPD (chronic obstructive pulmonary disease) (HCC) 04/26/2015   PFT-06/27/2015-minimal obstructive airways disease with insignificant response to bronchodilator, minimal diffusion defect, normal lung volumes. FEV1/FVC 0.76, DLCO 72%  . Coronary artery disease   . Dyspnea on exertion 04/26/2015  . Emphysema lung (HCC)    "treated for asthma but later found out it was emphysema" (10/15/2017)  . Essential hypertension 07/19/2016  . GERD (gastroesophageal reflux disease)   . High risk medication use 07/19/2016  . Hyperlipidemia    "left lung"  . Hypertension   . Inguinal hernia 07/19/2016   Overview:  Mild bilateral.  . Lung nodule seen on imaging study 07/28/2015   [facility] described a vague nodular density in the left upper lobe and recommended follow-up CT scan which was never done.   . LV dysfunction 05/30/2017  . MI (myocardial infarction) (HCC) 12/2013; 01/2017  . Mixed hyperlipidemia 07/19/2016  . 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 Prediabetes 07/19/2016  . Pulmonary embolism (HCC) 10/15/2017  . Stage 3 chronic kidney disease (HCC) 07/19/2016  . STEMI (ST elevation myocardial infarction) [facility](HCC) 10/18/2017   Feb 2015    Past Surgical History:  Procedure Laterality Date  . CARDIAC CATHETERIZATION  11/2016  . CATARACT EXTRACTION W/ INTRAOCULAR LENS  IMPLANT, BILATERAL Bilateral   . CORONARY ANGIOPLASTY WITH STENT PLACEMENT  12/2013-02/2014   "2 + 3 stents"  . CORONARY ANGIOPLASTY WITH STENT PLACEMENT  01/2017   distal LAD  . KNEE ARTHROSCOPY Right 1982    Current  Medications: Current Meds  Medication Sig  . albuterol (PROVENTIL HFA;VENTOLIN HFA) 108 (90 Base) MCG/ACT inhaler Inhale 1 puff into the lungs every 6 (six) hours as needed for wheezing or shortness of breath.  Marland Kitchen. atorvastatin (LIPITOR) 80 MG tablet Take 80 mg by mouth every evening.   . carvedilol (COREG) 12.5 MG tablet Take 1.5 tablets (18.75 mg total) by mouth 2 (two) times daily. 1 1/2 twice daily  . Cyanocobalamin (B-12 PO) Take by mouth daily.  . Mometasone Furo-Formoterol Fum (DULERA IN) Inhale 2 puffs into the lungs at bedtime.   . montelukast (SINGULAIR) 10 MG tablet Take 10 mg by mouth daily.  . nitroGLYCERIN (NITROSTAT) 0.4 MG SL tablet Place 0.4 mg under the tongue every 5 (five) minutes as needed for chest pain.  Marland Kitchen. omeprazole (PRILOSEC) 40 MG capsule Take 40 mg by mouth daily.  . Rivaroxaban (XARELTO) 15 MG TABS tablet Take 1 tablet (15 mg total) by mouth 2 (two) times daily with a meal for 20 days.  . [DISCONTINUED] aspirin 81  MG chewable tablet Chew 81 mg by mouth daily.  . [DISCONTINUED] carvedilol (COREG) 12.5 MG tablet Take 18.75 mg by mouth 2 (two) times daily. 1 1/2 twice daily  . [DISCONTINUED] ramipril (ALTACE) 5 MG capsule Take 5 mg by mouth daily.     Allergies:   Pneumovax 23 [pneumococcal vac polyvalent]   Social History   Socioeconomic History  . Marital status: Married    Spouse name: None  . Number of children: None  . Years of education: None  . Highest education level: None  Social Needs  . Financial resource strain: None  . Food insecurity - worry: None  . Food insecurity - inability: None  . Transportation needs - medical: None  . Transportation needs - non-medical: None  Occupational History  . None  Tobacco Use  . Smoking status: Former Smoker    Packs/day: 1.00    Years: 15.00    Pack years: 15.00    Types: Cigarettes    Last attempt to quit: 11/19/1976    Years since quitting: 40.9  . Smokeless tobacco: Former Neurosurgeon    Types: Chew    Quit  date: 1980  Substance and Sexual Activity  . Alcohol use: No    Frequency: Never  . Drug use: No  . Sexual activity: Not Currently  Other Topics Concern  . None  Social History Narrative  . None     Family History: The patient's family history includes Diabetes in his mother; Heart attack in his maternal grandmother and maternal uncle; Hyperlipidemia in his mother; Hypertension in his mother. ROS:   Please see the history of present illness.    All other systems reviewed and are negative.  EKGs/Labs/Other Studies Reviewed:    The following studies were reviewed today  Chest CTA 10/15/17: IMPRESSION: 1. Positive for multifocal left lung pulmonary embolus. No central or saddle embolus. 2. Critical Value/emergent results were called by telephone at the time of interpretation on 10/15/2017 at 11:12 am to Dr. Vanetta Mulders , who verbally acknowledged these results. 3. Possible left lower lobe pulmonary infarcts. Trace left pleural effusion. Additional nonspecific bilateral lower lobe patchy opacity. 4. Calcified coronary artery atherosclerosis and stents. Mild cardiomegaly. No pericardial effusion.  Recent Labs: 10/15/2017: BUN 21; Creatinine, Ser 1.13; Hemoglobin 14.9; Platelets 203; Potassium 4.4; Sodium 139  Recent Lipid Panel No results found for: CHOL, TRIG, HDL, CHOLHDL, VLDL, LDLCALC, LDLDIRECT  Physical Exam:    VS:  BP (!) 140/100 (BP Location: Right Arm, Patient Position: Sitting, Cuff Size: Normal)   Pulse 81   Ht 6\' 2"  (1.88 m)   Wt 203 lb (92.1 kg)   SpO2 97%   BMI 26.06 kg/m     Wt Readings from Last 3 Encounters:  10/24/17 203 lb (92.1 kg)  10/16/17 200 lb 6.4 oz (90.9 kg)  07/27/15 200 lb (90.7 kg)     GEN:  Well nourished, well developed in no acute distress HEENT: Normal NECK: No JVD; No carotid bruits LYMPHATICS: No lymphadenopathy CARDIAC: RRR, no murmurs, rubs, gallops RESPIRATORY:  Clear to auscultation without rales, wheezing or rhonchi   ABDOMEN: Soft, non-tender, non-distended MUSCULOSKELETAL:  No edema; No deformity  SKIN: Warm and dry NEUROLOGIC:  Alert and oriented x 3 PSYCHIATRIC:  Normal affect    Signed, Norman Herrlich, MD  10/25/2017 8:14 AM     [facility]

## 2017-10-24 ENCOUNTER — Encounter: Payer: Self-pay | Admitting: Cardiology

## 2017-10-24 ENCOUNTER — Ambulatory Visit (INDEPENDENT_AMBULATORY_CARE_PROVIDER_SITE_OTHER): Payer: PPO | Admitting: Cardiology

## 2017-10-24 VITALS — BP 140/100 | HR 81 | Ht 74.0 in | Wt 203.0 lb

## 2017-10-24 DIAGNOSIS — I10 Essential (primary) hypertension: Secondary | ICD-10-CM | POA: Diagnosis not present

## 2017-10-24 DIAGNOSIS — E782 Mixed hyperlipidemia: Secondary | ICD-10-CM

## 2017-10-24 DIAGNOSIS — I255 Ischemic cardiomyopathy: Secondary | ICD-10-CM | POA: Diagnosis not present

## 2017-10-24 DIAGNOSIS — I25119 Atherosclerotic heart disease of native coronary artery with unspecified angina pectoris: Secondary | ICD-10-CM | POA: Diagnosis not present

## 2017-10-24 DIAGNOSIS — Z7901 Long term (current) use of anticoagulants: Secondary | ICD-10-CM | POA: Diagnosis not present

## 2017-10-24 MED ORDER — SACUBITRIL-VALSARTAN 24-26 MG PO TABS: 1.0000 | ORAL_TABLET | Freq: Two times a day (BID) | ORAL | 1 refills | Status: DC

## 2017-10-24 MED ORDER — CARVEDILOL 12.5 MG PO TABS
18.7500 mg | ORAL_TABLET | Freq: Two times a day (BID) | ORAL | 3 refills | Status: DC
Start: 2017-10-24 — End: 2018-07-23

## 2017-10-24 NOTE — Patient Instructions (Addendum)
Medication Instructions:  Your physician has recommended you make the following change in your medication:  INCREASE carvedilol to 1.5 tablets (18.75 mg) twice daily STOP ramipril START sacubitril-valsartan (Entresto) 24-26 mg twice daily.  DO NOT start Entresto until you have been off ramipril for 3 full days.  Labwork: None  Testing/Procedures: None  Follow-Up: Your physician recommends that you schedule a follow-up appointment in: 6 weeks.  Any Other Special Instructions Will Be Listed Below (If Applicable).     If you need a refill on your cardiac medications before your next appointment, please call your pharmacy.    Please consider having cancer screening done.

## 2017-10-31 ENCOUNTER — Telehealth: Payer: Self-pay | Admitting: Cardiology

## 2017-10-31 NOTE — Telephone Encounter (Signed)
Patient advised that original request was denied, but did an exception today which was approved. Advised patient to contact pharmacy to determine his coverage/copay and return call if it is too expensive. Patient verbalized understanding. No further questions.

## 2017-10-31 NOTE — Telephone Encounter (Signed)
Left message to return call 

## 2017-10-31 NOTE — Telephone Encounter (Signed)
Mr. Liming called to say Dr. Jeanene Erb in [place] but insurance has declined this. Any other medicine recommended?

## 2017-11-07 ENCOUNTER — Other Ambulatory Visit: Payer: Self-pay

## 2017-11-07 NOTE — Telephone Encounter (Signed)
Hospital prescribed Xarelto 15 mg twice daily. Pharmacy wants to know if you will prescribe 20 mg once daily. Please advise.

## 2017-11-07 NOTE — Telephone Encounter (Signed)
Per Dr. Dulce Sellar, PCP to refill. Courtney at [facility] made aware.

## 2017-12-03 NOTE — Progress Notes (Signed)
Cardiology Office Note:    Date:  12/05/2017   ID:  Derek Hart, DOB [birth year], MRN [id number]  PCP:  Derek Payment., Hart  Cardiologist:  Derek Hart    Referring Hart: Derek Payment., Hart    ASSESSMENT:    1. Ischemic cardiomyopathy   2. Stage 3 chronic kidney disease (HCC)   3. Chronic anticoagulation    PLAN:    In order of problems listed above:  1. He will advance his dose of ARNI in 2 stages to guideline directed reassess in the office in 3 months and recheck EF.  He remains severely reduced need to be considered for device therapy with his CAD.  Continue other treatment including loop diuretic and optimal dose of beta-blocker with resting heart rate is 61 bpm.  Recheck renal function and BNP level today 2. Improved on recent labs rechecked today 3. Continue his anticoagulant with unprovoked pulmonary embolism   Next appointment: 3 months   Medication Adjustments/Labs and Tests Ordered: Current medicines are reviewed at length with the patient today.  Concerns regarding medicines are outlined above.  No orders of the defined types were placed in this encounter.  No orders of the defined types were placed in this encounter.   Chief Complaint  Patient presents with  . Follow-up    he transitioned to ARNI with severe cardiomyopathy    History of Present Illness:    Derek Hart is a 67 y.o. male with a hx of CAD and old MI, PCI of LAD and RCA, Cir in 2015, and distal LAD 01/2017 after a NSTEMI, mild LV dysfunction with EF 45% in March 2018, dyslipidemia and COPD  last seen ONE MONTH AGO AFTER AN UNPROVOKED PULMONARY EMBOLISM WITH EF REDUCED TO 35-40% . Compliance with diet, lifestyle and medications: yes His home BP runs 110-120 systolic.  He feels well he has no exercise intolerance shortness of breath chest pain palpitation or syncope and tolerated the transition to ARNI Past Medical History:  Diagnosis Date  . Allergic rhinitis 07/19/2016  . Asthma  07/19/2016  . Atherosclerotic heart disease of native coronary artery without angina pectoris 07/28/2015    CAD/ MI/ CABG  Overview:  Derek Wanke.  MI LAD stent. 01/2017 Overview:  He had MI with staged PCI of LAD and later RCA and LCF 2015, EF 40% Overview:  Added automatically from request for surgery [id number]  . Cardiomyopathy (HCC) 07/19/2016   Overview:  EF 45% or 54% 01/2017  . Chest pain in adult 02/01/2017  . Chronic bronchitis (HCC)   . COPD (chronic obstructive pulmonary disease) (HCC) 04/26/2015   PFT-06/27/2015-minimal obstructive airways disease with insignificant response to bronchodilator, minimal diffusion defect, normal lung volumes. FEV1/FVC 0.76, DLCO 72%  . Coronary artery disease   . Dyspnea on exertion 04/26/2015  . Emphysema lung (HCC)    "treated for asthma but later found out it was emphysema" (10/15/2017)  . Essential hypertension 07/19/2016  . GERD (gastroesophageal reflux disease)   . High risk medication use 07/19/2016  . Hyperlipidemia    "left lung"  . Hypertension   . Inguinal hernia 07/19/2016   Overview:  Mild bilateral.  . Lung nodule seen on imaging study 07/28/2015   chest x-ray report from 03/05/2014 at [facility] described a vague nodular density in the left upper lobe and recommended follow-up CT scan which was never done.   . LV dysfunction 05/30/2017  . MI (myocardial infarction) (HCC) 12/2013; 01/2017  . Mixed hyperlipidemia 07/19/2016  .  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 Prediabetes 07/19/2016  . Pulmonary embolism (HCC) 10/15/2017  . Stage 3 chronic kidney disease (HCC) 07/19/2016  . STEMI (ST elevation myocardial infarction) [facility]) 10/18/2017   Feb 2015    Past Surgical History:  Procedure Laterality Date  . CARDIAC CATHETERIZATION  11/2016  . CATARACT EXTRACTION W/ INTRAOCULAR LENS  IMPLANT, BILATERAL Bilateral   . CORONARY ANGIOPLASTY WITH STENT PLACEMENT  12/2013-02/2014   "2 + 3 stents"  . CORONARY ANGIOPLASTY WITH STENT PLACEMENT  01/2017     distal LAD  . KNEE ARTHROSCOPY Right 1982    Current Medications: Current Meds  Medication Sig  . albuterol (PROVENTIL HFA;VENTOLIN HFA) 108 (90 Base) MCG/ACT inhaler Inhale 1 puff into the lungs every 6 (six) hours as needed for wheezing or shortness of breath.  Marland Kitchen atorvastatin (LIPITOR) 80 MG tablet Take 80 mg by mouth every evening.   . carvedilol (COREG) 12.5 MG tablet Take 1.5 tablets (18.75 mg total) by mouth 2 (two) times daily. 1 1/2 twice daily  . clopidogrel (PLAVIX) 75 MG tablet Take by mouth.  . Cyanocobalamin (B-12 PO) Take by mouth daily.  . Mometasone Furo-Formoterol Fum (DULERA IN) Inhale 2 puffs into the lungs at bedtime.   . montelukast (SINGULAIR) 10 MG tablet Take 10 mg by mouth daily.  . nitroGLYCERIN (NITROSTAT) 0.4 MG SL tablet Place 0.4 mg under the tongue every 5 (five) minutes as needed for chest pain.  Marland Kitchen omeprazole (PRILOSEC) 40 MG capsule Take 40 mg by mouth daily.  . sacubitril-valsartan (ENTRESTO) 24-26 MG Take 1 tablet by mouth 2 (two) times daily. Do not take until you are off ramipril for 3 full days     Allergies:   Pneumovax 23 [pneumococcal vac polyvalent]   Social History   Socioeconomic History  . Marital status: Married    Spouse name: None  . Number of children: None  . Years of education: None  . Highest education level: None  Social Needs  . Financial resource strain: None  . Food insecurity - worry: None  . Food insecurity - inability: None  . Transportation needs - medical: None  . Transportation needs - non-medical: None  Occupational History  . None  Tobacco Use  . Smoking status: Former Smoker    Packs/day: 1.00    Years: 15.00    Pack years: 15.00    Types: Cigarettes    Last attempt to quit: 11/19/1976    Years since quitting: 41.0  . Smokeless tobacco: Former Neurosurgeon    Types: Chew    Quit date: 1980  Substance and Sexual Activity  . Alcohol use: No    Frequency: Never  . Drug use: No  . Sexual activity: Not Currently   Other Topics Concern  . None  Social History Narrative  . None     Family History: The patient's family history includes Diabetes in his mother; Heart attack in his maternal grandmother and maternal uncle; Hyperlipidemia in his mother; Hypertension in his mother. ROS:   Please see the history of present illness.    All other systems reviewed and are negative.  EKGs/Labs/Other Studies Reviewed:    The following studies were reviewed today  Recent Labs: 10/15/2017: BUN 21; Creatinine, Ser 1.13; Hemoglobin 14.9; Platelets 203; Potassium 4.4; Sodium 139  Recent Lipid Panel No results found for: CHOL, TRIG, HDL, CHOLHDL, VLDL, LDLCALC, LDLDIRECT  Physical Exam:    VS:  BP (!) 144/94 (BP Location: Right Arm, Patient Position: Sitting, Cuff  Size: Normal)   Pulse 61   Ht 6\' 2"  (1.88 m)   Wt 204 lb (92.5 kg)   SpO2 98%   BMI 26.19 kg/m     Wt Readings from Last 3 Encounters:  12/05/17 204 lb (92.5 kg)  10/24/17 203 lb (92.1 kg)  10/16/17 200 lb 6.4 oz (90.9 kg)     GEN:  Well nourished, well developed in no acute distress HEENT: Normal NECK: No JVD; No carotid bruits LYMPHATICS: No lymphadenopathy CARDIAC: RRR, no murmurs, rubs, gallops RESPIRATORY:  Clear to auscultation without rales, wheezing or rhonchi  ABDOMEN: Soft, non-tender, non-distended MUSCULOSKELETAL:  No edema; No deformity  SKIN: Warm and dry NEUROLOGIC:  Alert and oriented x 3 PSYCHIATRIC:  Normal affect    Signed, Derek Hart  12/05/2017 9:25 AM    [facility]

## 2017-12-05 ENCOUNTER — Encounter: Payer: Self-pay | Admitting: Cardiology

## 2017-12-05 ENCOUNTER — Ambulatory Visit (INDEPENDENT_AMBULATORY_CARE_PROVIDER_SITE_OTHER): Payer: PPO | Admitting: Cardiology

## 2017-12-05 VITALS — BP 144/94 | HR 61 | Ht 74.0 in | Wt 204.0 lb

## 2017-12-05 DIAGNOSIS — I255 Ischemic cardiomyopathy: Secondary | ICD-10-CM | POA: Diagnosis not present

## 2017-12-05 DIAGNOSIS — N183 Chronic kidney disease, stage 3 unspecified: Secondary | ICD-10-CM

## 2017-12-05 DIAGNOSIS — Z7901 Long term (current) use of anticoagulants: Secondary | ICD-10-CM | POA: Diagnosis not present

## 2017-12-05 MED ORDER — CLOPIDOGREL BISULFATE 75 MG PO TABS: 75.0000 mg | ORAL_TABLET | Freq: Every day | ORAL | 2 refills | Status: DC

## 2017-12-05 MED ORDER — SACUBITRIL-VALSARTAN 97-103 MG PO TABS: 1.0000 | ORAL_TABLET | Freq: Two times a day (BID) | ORAL | 3 refills | Status: DC

## 2017-12-05 NOTE — Patient Instructions (Addendum)
Medication Instructions:  Your physician has recommended you make the following change in your medication:  Stop Entrest 24/26,  Use the new Entresto 49/51 twice daily for 2 weeks and if tolerated and BP > 100-110 at home increase to the new prescription Entrest 97/103 which has been sent your pharmacy.  Labwork: Your physician recommends that you have lab work today: BMP and BNP  Testing/Procedures: None ordered  Follow-Up: Your physician recommends that you schedule a follow-up appointment in: 3 months with Dr. Dulce Sellar   Any Other Special Instructions Will Be Listed Below (If Applicable).     If you need a refill on your cardiac medications before your next appointment, please call your pharmacy.

## 2017-12-06 LAB — BASIC METABOLIC PANEL
BUN / CREAT RATIO: 19 (ref 10–24)
BUN: 19 mg/dL (ref 8–27)
CHLORIDE: 104 mmol/L (ref 96–106)
CO2: 22 mmol/L (ref 20–29)
CREATININE: 1.02 mg/dL (ref 0.76–1.27)
Calcium: 9 mg/dL (ref 8.6–10.2)
GFR calc non Af Amer: 76 mL/min/{1.73_m2} (ref >59)
GFR, EST AFRICAN AMERICAN: 88 mL/min/{1.73_m2} (ref >59)
GLUCOSE: 113 mg/dL — AB (ref 65–99)
Potassium: 4.1 mmol/L (ref 3.5–5.2)
SODIUM: 141 mmol/L (ref 134–144)

## 2017-12-06 LAB — BRAIN NATRIURETIC PEPTIDE: BNP: 58.3 pg/mL (ref 0.0–100.0)

## 2018-01-28 DIAGNOSIS — E782 Mixed hyperlipidemia: Secondary | ICD-10-CM | POA: Diagnosis not present

## 2018-01-28 DIAGNOSIS — J4520 Mild intermittent asthma, uncomplicated: Secondary | ICD-10-CM | POA: Diagnosis not present

## 2018-01-28 DIAGNOSIS — I2782 Chronic pulmonary embolism: Secondary | ICD-10-CM | POA: Diagnosis not present

## 2018-01-28 DIAGNOSIS — R7303 Prediabetes: Secondary | ICD-10-CM | POA: Diagnosis not present

## 2018-01-28 DIAGNOSIS — I2510 Atherosclerotic heart disease of native coronary artery without angina pectoris: Secondary | ICD-10-CM | POA: Diagnosis not present

## 2018-01-28 DIAGNOSIS — N183 Chronic kidney disease, stage 3 (moderate): Secondary | ICD-10-CM | POA: Diagnosis not present

## 2018-01-28 DIAGNOSIS — I129 Hypertensive chronic kidney disease with stage 1 through stage 4 chronic kidney disease, or unspecified chronic kidney disease: Secondary | ICD-10-CM | POA: Diagnosis not present

## 2018-02-18 DIAGNOSIS — M545 Low back pain: Secondary | ICD-10-CM | POA: Diagnosis not present

## 2018-03-04 NOTE — Progress Notes (Signed)
Cardiology Office Note:    Date:  03/05/2018   ID:  Derek Hart, DOB 18-Apr-1951, MRN [id number]  PCP:  Gordan Payment., MD  Cardiologist:  Norman Herrlich, MD    Referring MD: Gordan Payment., MD    ASSESSMENT:    1. Coronary artery disease involving native coronary artery of native heart with angina pectoris (HCC)   2. Chronic anticoagulation   3. Essential hypertension   4. Mixed hyperlipidemia    PLAN:    In order of problems listed above:  1. Stable has had no anginal discomfort is greater than 1 year from PCI.  With his decision to discontinue anticoagulation will place him back on aspirin and continue clopidogrel. 2. Patient is aware that he has a increased risk of recurrent pulmonary embolism but made a decision he is going to stop his anticoagulants mostly for financial consequence and will switch to dual antiplatelet therapy 3. Stable blood pressure target continue current treatment 4. Stable lipids continue his statin recent labs 01/28/2018 show normal CMP cholesterol 128 LDL 91 and GFR 61 cc   Next appointment: 6 months   Medication Adjustments/Labs and Tests Ordered: Current medicines are reviewed at length with the patient today.  Concerns regarding medicines are outlined above.  Orders Placed This Encounter  Procedures  . ECHOCARDIOGRAM COMPLETE   Meds ordered this encounter  Medications  . aspirin EC 81 MG tablet    Sig: Take 1 tablet (81 mg total) by mouth daily.    Dispense:  30 tablet    Refill:  0    Chief Complaint  Patient presents with  . Follow-up  . Coronary Artery Disease  . Hyperlipidemia  . Anticoagulation    History of Present Illness:    Derek Hart is a 67 y.o. male with a hx of CADandold MI, PCI of LAD and RCA, Cir in 2015, and distal LAD 01/2017 after a NSTEMI, mild LV dysfunctionwith EF 45% in March 2018, dyslipidemia and COPDand  AN UNPROVOKED PULMONARY EMBOLISM WITH EF REDUCED TO 35-40%  last seen 12/05/17.  ASSESSMENT:     12/05/17   1. Ischemic cardiomyopathy   2. Stage 3 chronic kidney disease (HCC)   3. Chronic anticoagulation    PLAN:    1.   He will advance his dose of ARNI in 2 stages to guideline directed reassess in the office in 3 months and recheck EF.  He remains severely reduced need to be considered for device therapy with his CAD.  Continue other treatment including loop diuretic and optimal dose of beta-blocker with resting heart rate is 61 bpm.  Recheck renal function and BNP level today 5. Improved on recent labs rechecked today 6. Continue his anticoagulant with unprovoked pulmonary embolism  Compliance with diet, lifestyle and medications: Yes He is aware he is at increased risk of recurrent pulmonary embolism but he is comfortable that he has been adequately treated and he tells me he is stopping his anticoagulant.  He made an informed decision.  I told him if he perceives he had another pulmonary embolism to seek medical attention immediately he has had no shortness of breath chest pain palpitation or syncope. Past Medical History:  Diagnosis Date  . Allergic rhinitis 07/19/2016  . Asthma 07/19/2016  . Atherosclerotic heart disease of native coronary artery without angina pectoris 07/28/2015    CAD/ MI/ CABG  Overview:  [place].  MI LAD stent. 01/2017 Overview:  He had MI with staged PCI of LAD and later RCA  and LCF 2015, EF 40% Overview:  Added automatically from request for surgery [id number]  . Cardiomyopathy (HCC) 07/19/2016   Overview:  EF 45% or 54% 01/2017  . Chest pain in adult 02/01/2017  . Chronic bronchitis (HCC)   . COPD (chronic obstructive pulmonary disease) (HCC) 04/26/2015   PFT-06/27/2015-minimal obstructive airways disease with insignificant response to bronchodilator, minimal diffusion defect, normal lung volumes. FEV1/FVC 0.76, DLCO 72%  . Coronary artery disease   . Dyspnea on exertion 04/26/2015  . Emphysema lung (HCC)    "treated for asthma but later found out it was  emphysema" (10/15/2017)  . Essential hypertension 07/19/2016  . GERD (gastroesophageal reflux disease)   . High risk medication use 07/19/2016  . Hyperlipidemia    "left lung"  . Hypertension   . Inguinal hernia 07/19/2016   Overview:  Mild bilateral.  . Lung nodule seen on imaging study 07/28/2015   chest x-ray report from 03/05/2014 at [facility] described a vague nodular density in the left upper lobe and recommended follow-up CT scan which was never done.   . LV dysfunction 05/30/2017  . MI (myocardial infarction) (HCC) 12/2013; 01/2017  . Mixed hyperlipidemia 07/19/2016  . 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 Prediabetes 07/19/2016  . Pulmonary embolism (HCC) 10/15/2017  . Stage 3 chronic kidney disease (HCC) 07/19/2016  . STEMI (ST elevation myocardial infarction) [facility]) 10/18/2017   Feb 2015    Past Surgical History:  Procedure Laterality Date  . CARDIAC CATHETERIZATION  11/2016  . CATARACT EXTRACTION W/ INTRAOCULAR LENS  IMPLANT, BILATERAL Bilateral   . CORONARY ANGIOPLASTY WITH STENT PLACEMENT  12/2013-02/2014   "2 + 3 stents"  . CORONARY ANGIOPLASTY WITH STENT PLACEMENT  01/2017   distal LAD  . KNEE ARTHROSCOPY Right 1982    Current Medications: Current Meds  Medication Sig  . albuterol (PROVENTIL HFA;VENTOLIN HFA) 108 (90 Base) MCG/ACT inhaler Inhale 1 puff into the lungs every 6 (six) hours as needed for wheezing or shortness of breath.  Marland Kitchen atorvastatin (LIPITOR) 80 MG tablet Take 80 mg by mouth every evening.   . carvedilol (COREG) 12.5 MG tablet Take 1.5 tablets (18.75 mg total) by mouth 2 (two) times daily. 1 1/2 twice daily  . clopidogrel (PLAVIX) 75 MG tablet Take 1 tablet (75 mg total) by mouth daily.  . Cyanocobalamin (B-12 PO) Take by mouth daily.  . Mometasone Furo-Formoterol Fum (DULERA IN) Inhale 2 puffs into the lungs at bedtime.   . montelukast (SINGULAIR) 10 MG tablet Take 10 mg by mouth daily.  . nitroGLYCERIN (NITROSTAT) 0.4 MG SL tablet Place 0.4  mg under the tongue every 5 (five) minutes as needed for chest pain.  Marland Kitchen omeprazole (PRILOSEC) 40 MG capsule Take 40 mg by mouth daily.  . sacubitril-valsartan (ENTRESTO) 97-103 MG Take 1 tablet by mouth 2 (two) times daily.  . [DISCONTINUED] Rivaroxaban (XARELTO) 15 MG TABS tablet Take 15 mg by mouth daily with supper.     Allergies:   Pneumovax 23 [pneumococcal vac polyvalent]   Social History   Socioeconomic History  . Marital status: Married    Spouse name: Not on file  . Number of children: Not on file  . Years of education: Not on file  . Highest education level: Not on file  Occupational History  . Not on file  Social Needs  . Financial resource strain: Not on file  . Food insecurity:    Worry: Not on file    Inability: Not on file  . Transportation  needs:    Medical: Not on file    Non-medical: Not on file  Tobacco Use  . Smoking status: Former Smoker    Packs/day: 1.00    Years: 15.00    Pack years: 15.00    Types: Cigarettes    Last attempt to quit: 11/19/1976    Years since quitting: 41.3  . Smokeless tobacco: Former Neurosurgeon    Types: Chew    Quit date: 1980  Substance and Sexual Activity  . Alcohol use: No    Frequency: Never  . Drug use: No  . Sexual activity: Not Currently  Lifestyle  . Physical activity:    Days per week: Not on file    Minutes per session: Not on file  . Stress: Not on file  Relationships  . Social connections:    Talks on phone: Not on file    Gets together: Not on file    Attends religious service: Not on file    Active member of club or organization: Not on file    Attends meetings of clubs or organizations: Not on file    Relationship status: Not on file  Other Topics Concern  . Not on file  Social History Narrative  . Not on file     Family History: The patient's family history includes Diabetes in his mother; Heart attack in his maternal grandmother and maternal uncle; Hyperlipidemia in his mother; Hypertension in his  mother. ROS:   Please see the history of present illness.    All other systems reviewed and are negative.  EKGs/Labs/Other Studies Reviewed:    The following studies were reviewed today:  Recent Labs: 10/15/2017: Hemoglobin 14.9; Platelets 203 12/05/2017: BNP 58.3; BUN 19; Creatinine, Ser 1.02; Potassium 4.1; Sodium 141  Recent Lipid Panel No results found for: CHOL, TRIG, HDL, CHOLHDL, VLDL, LDLCALC, LDLDIRECT  Physical Exam:    VS:  BP 120/86 (BP Location: Right Arm, Patient Position: Sitting, Cuff Size: Normal)   Pulse (!) 56   Ht 6\' 2"  (1.88 m)   Wt 202 lb 1.9 oz (91.7 kg)   SpO2 94%   BMI 25.95 kg/m     Wt Readings from Last 3 Encounters:  03/05/18 202 lb 1.9 oz (91.7 kg)  12/05/17 204 lb (92.5 kg)  10/24/17 203 lb (92.1 kg)     GEN:  Well nourished, well developed in no acute distress HEENT: Normal NECK: No JVD; No carotid bruits LYMPHATICS: No lymphadenopathy CARDIAC: RRR, no murmurs, rubs, gallops RESPIRATORY:  Clear to auscultation without rales, wheezing or rhonchi  ABDOMEN: Soft, non-tender, non-distended MUSCULOSKELETAL:  No edema; No deformity  SKIN: Warm and dry NEUROLOGIC:  Alert and oriented x 3 PSYCHIATRIC:  Normal affect    Signed, Norman Herrlich, MD  03/05/2018 7:44 PM    [facility]

## 2018-03-05 ENCOUNTER — Encounter: Payer: Self-pay | Admitting: Cardiology

## 2018-03-05 ENCOUNTER — Ambulatory Visit (INDEPENDENT_AMBULATORY_CARE_PROVIDER_SITE_OTHER): Payer: PPO | Admitting: Cardiology

## 2018-03-05 VITALS — BP 120/86 | HR 56 | Ht 74.0 in | Wt 202.1 lb

## 2018-03-05 DIAGNOSIS — E782 Mixed hyperlipidemia: Secondary | ICD-10-CM

## 2018-03-05 DIAGNOSIS — I10 Essential (primary) hypertension: Secondary | ICD-10-CM

## 2018-03-05 DIAGNOSIS — I25119 Atherosclerotic heart disease of native coronary artery with unspecified angina pectoris: Secondary | ICD-10-CM

## 2018-03-05 DIAGNOSIS — Z7901 Long term (current) use of anticoagulants: Secondary | ICD-10-CM | POA: Diagnosis not present

## 2018-03-05 MED ORDER — ASPIRIN EC 81 MG PO TBEC: 81.0000 mg | DELAYED_RELEASE_TABLET | Freq: Every day | ORAL | 0 refills | Status: DC

## 2018-03-05 NOTE — Patient Instructions (Signed)
Medication Instructions:  Your physician has recommended you make the following change in your medication:  STOP Xarelto START aspirin 81 mg enteric coated daily  Labwork: None  Testing/Procedures: Your physician has requested that you have an echocardiogram. Echocardiography is a painless test that uses sound waves to create images of your heart. It provides your doctor with information about the size and shape of your heart and how well your heart's chambers and valves are working. This procedure takes approximately one hour. There are no restrictions for this procedure. This will be scheduled in [place] in July. You will receive a phone call or a letter in the mail when time to schedule.  Follow-Up: Your physician wants you to follow-up in: 6 months. You will receive a reminder letter in the mail two months in advance. If you don't receive a letter, please call our office to schedule the follow-up appointment.  Any Other Special Instructions Will Be Listed Below (If Applicable).     If you need a refill on your cardiac medications before your next appointment, please call your pharmacy.

## 2018-03-25 ENCOUNTER — Other Ambulatory Visit: Payer: Self-pay

## 2018-03-25 DIAGNOSIS — I255 Ischemic cardiomyopathy: Secondary | ICD-10-CM

## 2018-03-25 MED ORDER — SACUBITRIL-VALSARTAN 97-103 MG PO TABS: 1.0000 | ORAL_TABLET | Freq: Two times a day (BID) | ORAL | 3 refills | Status: DC

## 2018-04-11 DIAGNOSIS — L720 Epidermal cyst: Secondary | ICD-10-CM | POA: Diagnosis not present

## 2018-04-11 DIAGNOSIS — L578 Other skin changes due to chronic exposure to nonionizing radiation: Secondary | ICD-10-CM | POA: Diagnosis not present

## 2018-04-11 DIAGNOSIS — L821 Other seborrheic keratosis: Secondary | ICD-10-CM | POA: Diagnosis not present

## 2018-05-26 ENCOUNTER — Ambulatory Visit (INDEPENDENT_AMBULATORY_CARE_PROVIDER_SITE_OTHER): Payer: PPO

## 2018-05-26 ENCOUNTER — Other Ambulatory Visit: Payer: Self-pay

## 2018-05-26 DIAGNOSIS — I25119 Atherosclerotic heart disease of native coronary artery with unspecified angina pectoris: Secondary | ICD-10-CM | POA: Diagnosis not present

## 2018-05-26 NOTE — Progress Notes (Signed)
Echocardiogram completed  Derek Hart RDCS

## 2018-05-27 DIAGNOSIS — Z79899 Other long term (current) drug therapy: Secondary | ICD-10-CM | POA: Diagnosis not present

## 2018-05-27 DIAGNOSIS — J40 Bronchitis, not specified as acute or chronic: Secondary | ICD-10-CM | POA: Diagnosis not present

## 2018-05-27 DIAGNOSIS — J4520 Mild intermittent asthma, uncomplicated: Secondary | ICD-10-CM | POA: Diagnosis not present

## 2018-05-27 DIAGNOSIS — N183 Chronic kidney disease, stage 3 (moderate): Secondary | ICD-10-CM | POA: Diagnosis not present

## 2018-05-27 DIAGNOSIS — I255 Ischemic cardiomyopathy: Secondary | ICD-10-CM | POA: Diagnosis not present

## 2018-05-29 ENCOUNTER — Other Ambulatory Visit: Payer: Self-pay | Admitting: Cardiology

## 2018-07-23 ENCOUNTER — Other Ambulatory Visit: Payer: Self-pay | Admitting: Cardiology

## 2018-07-23 ENCOUNTER — Other Ambulatory Visit: Payer: Self-pay

## 2018-07-23 DIAGNOSIS — I255 Ischemic cardiomyopathy: Secondary | ICD-10-CM

## 2018-07-23 IMAGING — DX DG CHEST 2V
2 series · 2 of 2 positions shown · non-contrast
Comparison: None.

CLINICAL DATA: Left-sided chest pain.  Shortness of breath.

EXAM:
CHEST  2 VIEW

[chest pa]
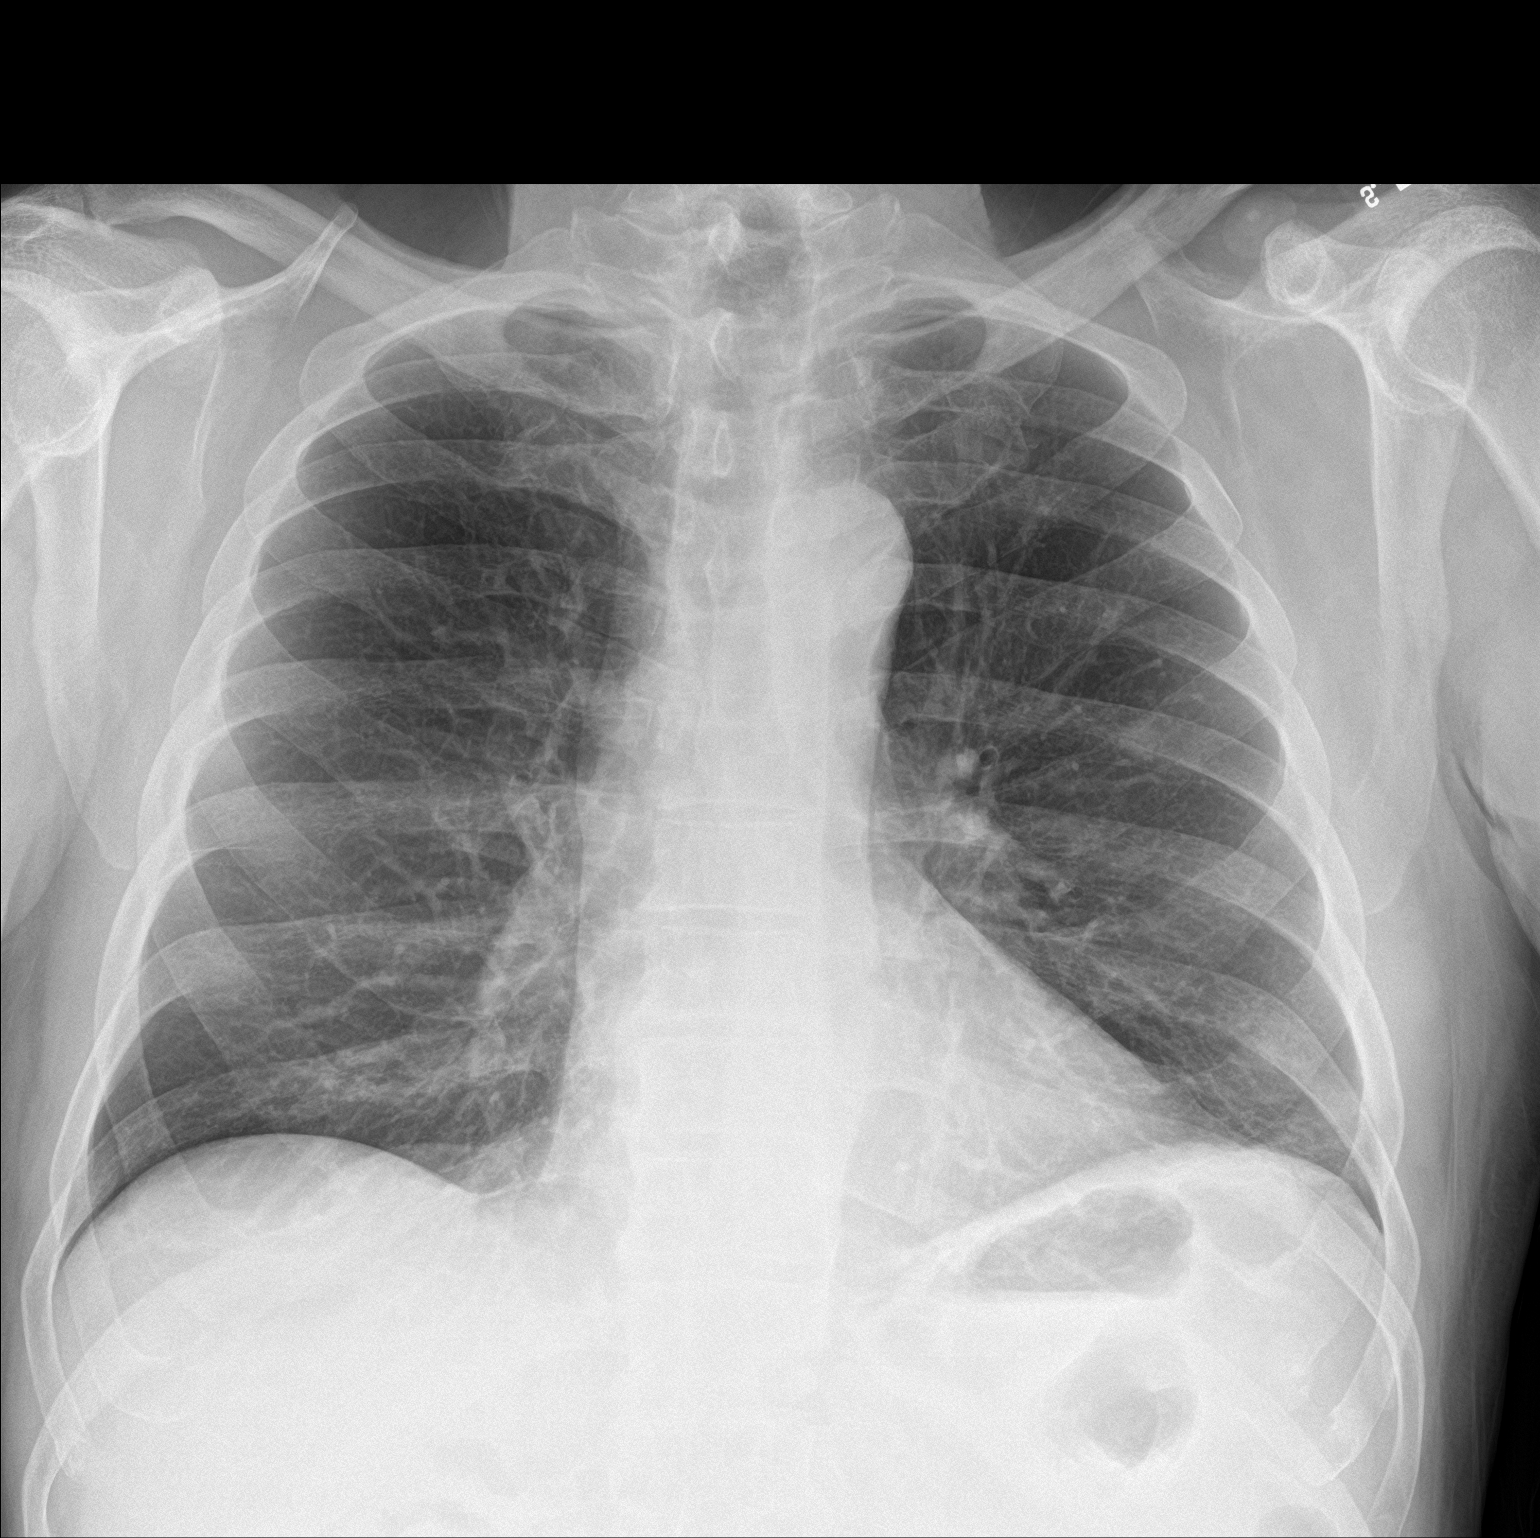

[chest lat]
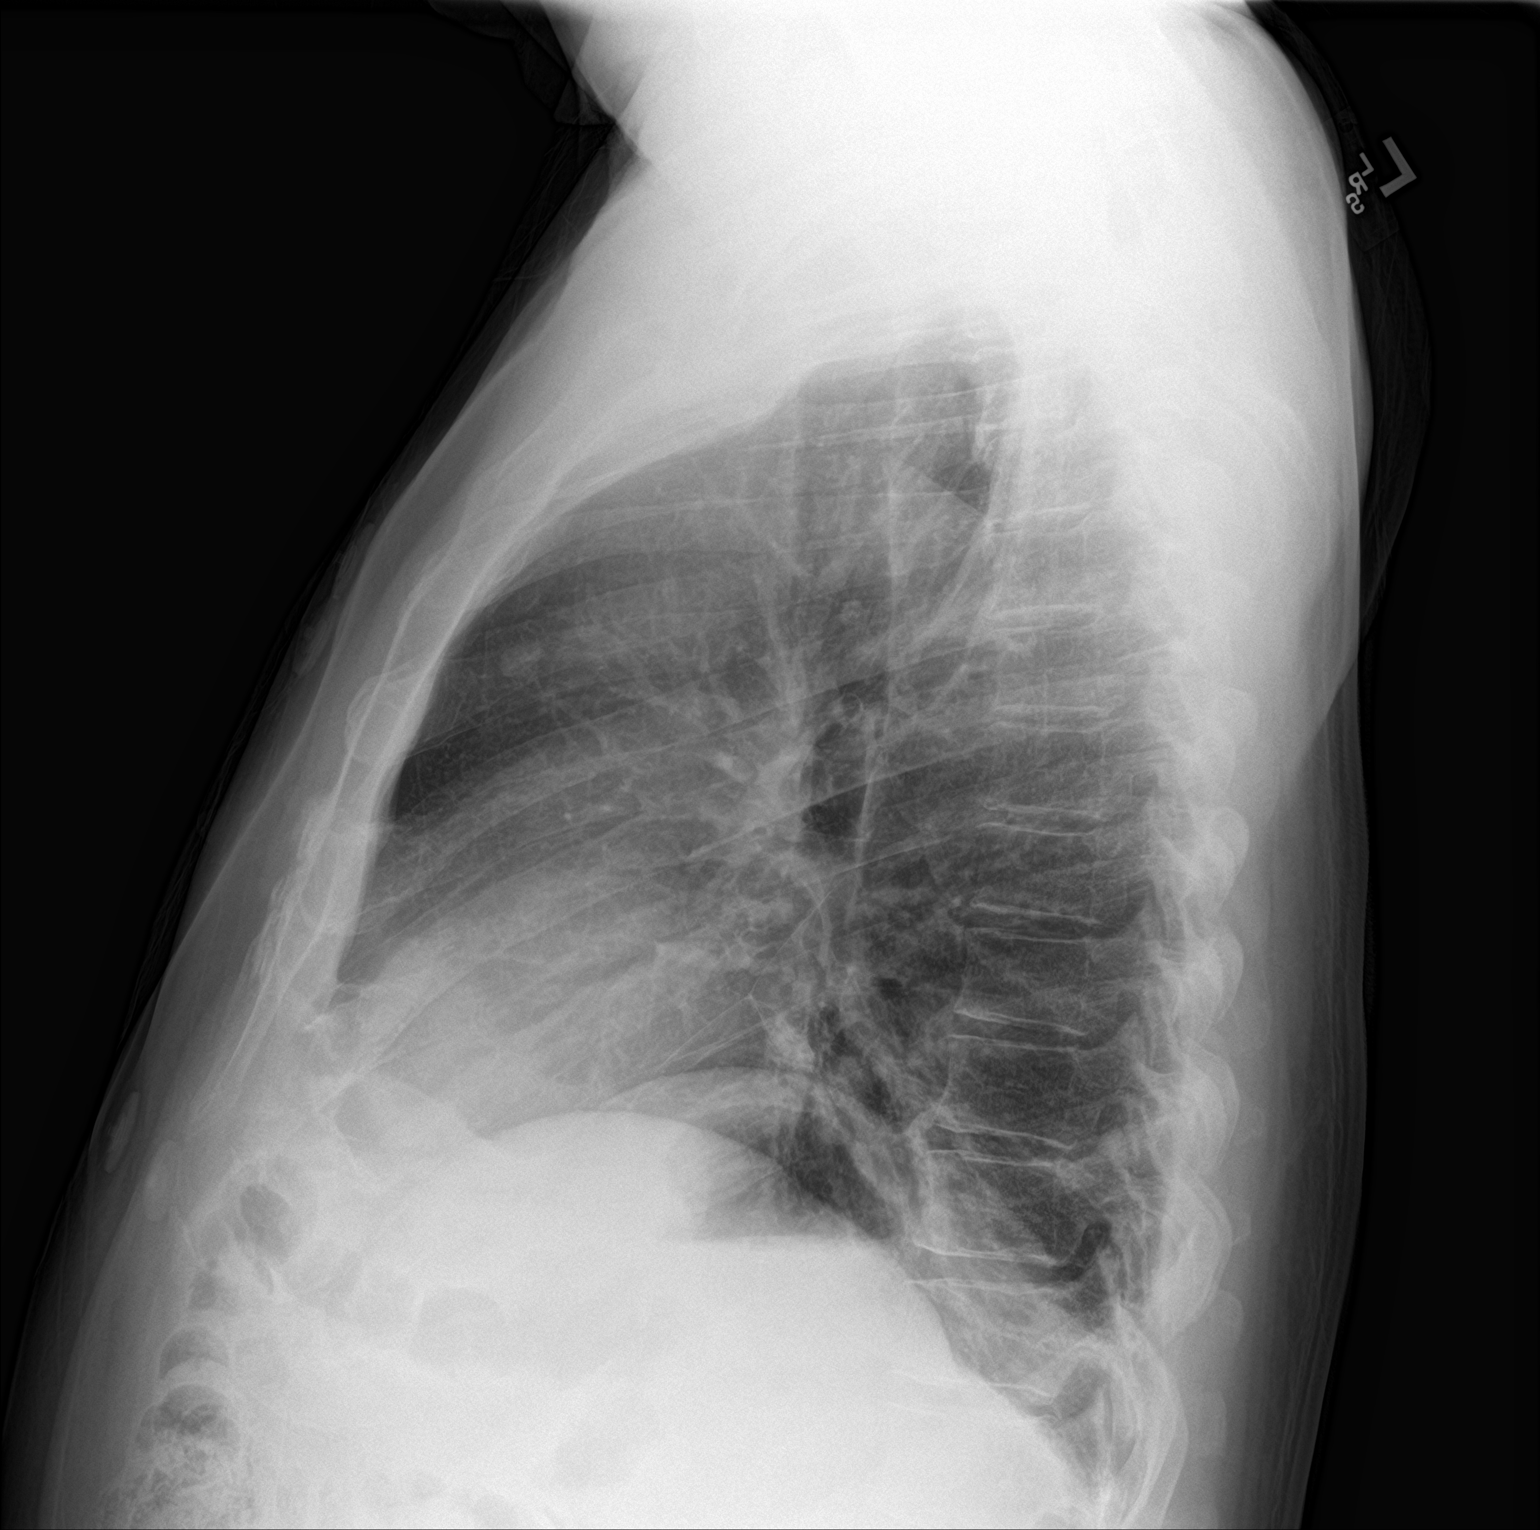

[2 of 2 positions shown; findings below may reference images not displayed]

FINDINGS: The cardiomediastinal contours are normal. The lungs are clear.
Pulmonary vasculature is normal. No consolidation, pleural effusion,
or pneumothorax. No acute osseous abnormalities are seen.
IMPRESSION: No acute pulmonary process.

## 2018-07-23 MED ORDER — SACUBITRIL-VALSARTAN 97-103 MG PO TABS: 1.0000 | ORAL_TABLET | Freq: Two times a day (BID) | ORAL | 1 refills | Status: DC

## 2018-08-13 DIAGNOSIS — J4520 Mild intermittent asthma, uncomplicated: Secondary | ICD-10-CM | POA: Diagnosis not present

## 2018-08-13 DIAGNOSIS — I129 Hypertensive chronic kidney disease with stage 1 through stage 4 chronic kidney disease, or unspecified chronic kidney disease: Secondary | ICD-10-CM | POA: Diagnosis not present

## 2018-08-13 DIAGNOSIS — J302 Other seasonal allergic rhinitis: Secondary | ICD-10-CM | POA: Diagnosis not present

## 2018-08-13 DIAGNOSIS — I2510 Atherosclerotic heart disease of native coronary artery without angina pectoris: Secondary | ICD-10-CM | POA: Diagnosis not present

## 2018-08-13 DIAGNOSIS — R7303 Prediabetes: Secondary | ICD-10-CM | POA: Diagnosis not present

## 2018-08-13 DIAGNOSIS — N183 Chronic kidney disease, stage 3 (moderate): Secondary | ICD-10-CM | POA: Diagnosis not present

## 2018-08-13 DIAGNOSIS — Z125 Encounter for screening for malignant neoplasm of prostate: Secondary | ICD-10-CM | POA: Diagnosis not present

## 2018-08-13 DIAGNOSIS — E782 Mixed hyperlipidemia: Secondary | ICD-10-CM | POA: Diagnosis not present

## 2018-08-13 DIAGNOSIS — I255 Ischemic cardiomyopathy: Secondary | ICD-10-CM | POA: Diagnosis not present

## 2018-08-13 DIAGNOSIS — Z79899 Other long term (current) drug therapy: Secondary | ICD-10-CM | POA: Diagnosis not present

## 2018-08-13 DIAGNOSIS — K4020 Bilateral inguinal hernia, without obstruction or gangrene, not specified as recurrent: Secondary | ICD-10-CM | POA: Diagnosis not present

## 2018-08-13 DIAGNOSIS — Z0000 Encounter for general adult medical examination without abnormal findings: Secondary | ICD-10-CM | POA: Diagnosis not present

## 2018-08-15 DIAGNOSIS — R319 Hematuria, unspecified: Secondary | ICD-10-CM

## 2018-08-15 HISTORY — DX: Hematuria, unspecified: R31.9

## 2018-08-27 DIAGNOSIS — Z23 Encounter for immunization: Secondary | ICD-10-CM | POA: Diagnosis not present

## 2018-08-27 DIAGNOSIS — R319 Hematuria, unspecified: Secondary | ICD-10-CM | POA: Diagnosis not present

## 2018-09-01 NOTE — Progress Notes (Signed)
Cardiology Office Note:    Date:  09/02/2018   ID:  Wyatt Haste, DOB 1951-04-08, MRN [id number]  PCP:  Gordan Payment., MD  Cardiologist:  Norman Herrlich, MD    Referring MD: Gordan Payment., MD    ASSESSMENT:    1. Ischemic cardiomyopathy   2. Coronary artery disease involving native coronary artery of native heart with angina pectoris (HCC)   3. Essential hypertension   4. Mixed hyperlipidemia   5. Other pulmonary embolism without acute cor pulmonale, unspecified chronicity (HCC)   6. Chronic anticoagulation    PLAN:    In order of problems listed above:  1. Improved continue current treatment including his ARNI ejection fraction is now near normal 2. Stable after multiple percutaneous interventions presently asymptomatic having no angina Canadian angina classification 1 continue medical treatment I do not think he requires an ischemia evaluation 3. Stable blood pressure target continue current treatment 4. Stable his LDL exceeds 100 we discussed additional lipid-lowering therapy and he declines at this time continue his high intensity statin with CAD and high risk 5. No longer an anticoagulant his pulmonary embolism was without identifiable precipitant he has significant risk of recurrent 6. No longer anticoagulated   Next appointment: 6 months   Medication Adjustments/Labs and Tests Ordered: Current medicines are reviewed at length with the patient today.  Concerns regarding medicines are outlined above.  No orders of the defined types were placed in this encounter.  No orders of the defined types were placed in this encounter.   Chief Complaint  Patient presents with  . Follow-up  . Cardiomyopathy  . Coronary Artery Disease    History of Present Illness:    Isiaih Schweikart is a 67 y.o. male with a hx of CAD and old MI, PCI of LAD and RCA, Cir in 2015, and distal LAD 01/2017 after a NSTEMI, mild LV dysfunction with EF 45% in March 2018, dyslipidemia and COPD and   AN UNPROVOKED PULMONARY EMBOLISM WITH EF REDUCED TO 35-40% last seen 03/05/18.  He was treated medically including ARNI and subsequent echocardiogram performed 05/26/2018 showed improvement ejection fraction 50 to 55%. Compliance with diet, lifestyle and medications: yes  He is seen by me in the office and overall pleased with the quality of his life he has mild exertional shortness of breath but stable and is not paroxysmal is not progressive no edema pleuritic chest pain angina palpitation or syncope.  He made a decision because of cost of medications and did not continue on reduced dose anticoagulant.  I told him he is at significant risk of recurrent pulmonary embolism and any clinical marker in terms of his symptoms should prompt communication with our office. Past Medical History:  Diagnosis Date  . Allergic rhinitis 07/19/2016  . Asthma 07/19/2016  . Atherosclerotic heart disease of native coronary artery without angina pectoris 07/28/2015    CAD/ MI/ CABG  Overview:  [place].  MI LAD stent. 01/2017 Overview:  He had MI with staged PCI of LAD and later RCA and LCF 2015, EF 40% Overview:  Added automatically from request for surgery [id number]  . Cardiomyopathy (HCC) 07/19/2016   Overview:  EF 45% or 54% 01/2017  . Chest pain in adult 02/01/2017  . Chronic bronchitis (HCC)   . COPD (chronic obstructive pulmonary disease) (HCC) 04/26/2015   PFT-06/27/2015-minimal obstructive airways disease with insignificant response to bronchodilator, minimal diffusion defect, normal lung volumes. FEV1/FVC 0.76, DLCO 72%  . Coronary artery disease   . Dyspnea on  exertion 04/26/2015  . Emphysema lung (HCC)    "treated for asthma but later found out it was emphysema" (10/15/2017)  . Essential hypertension 07/19/2016  . GERD (gastroesophageal reflux disease)   . High risk medication use 07/19/2016  . Hyperlipidemia    "left lung"  . Hypertension   . Inguinal hernia 07/19/2016   Overview:  Mild bilateral.  . Lung nodule  seen on imaging study 07/28/2015   chest x-ray report from 03/05/2014 at [facility] described a vague nodular density in the left upper lobe and recommended follow-up CT scan which was never done.   . LV dysfunction 05/30/2017  . MI (myocardial infarction) (HCC) 12/2013; 01/2017  . Mixed hyperlipidemia 07/19/2016  . 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 Prediabetes 07/19/2016  . Pulmonary embolism (HCC) 10/15/2017  . Stage 3 chronic kidney disease (HCC) 07/19/2016  . STEMI (ST elevation myocardial infarction) [facility]) 10/18/2017   Feb 2015    Past Surgical History:  Procedure Laterality Date  . CARDIAC CATHETERIZATION  11/2016  . CATARACT EXTRACTION W/ INTRAOCULAR LENS  IMPLANT, BILATERAL Bilateral   . CORONARY ANGIOPLASTY WITH STENT PLACEMENT  12/2013-02/2014   "2 + 3 stents"  . CORONARY ANGIOPLASTY WITH STENT PLACEMENT  01/2017   distal LAD  . KNEE ARTHROSCOPY Right 1982    Current Medications: Current Meds  Medication Sig  . albuterol (PROVENTIL HFA;VENTOLIN HFA) 108 (90 Base) MCG/ACT inhaler Inhale 1 puff into the lungs every 6 (six) hours as needed for wheezing or shortness of breath.  Marland Kitchen aspirin EC 81 MG tablet Take 1 tablet (81 mg total) by mouth daily.  Marland Kitchen atorvastatin (LIPITOR) 80 MG tablet Take 80 mg by mouth every evening.   . carvedilol (COREG) 12.5 MG tablet TAKE 1 AND 1/2 tablets BY MOUTH TWICE (2) DAILY  . clopidogrel (PLAVIX) 75 MG tablet TAKE 1 TABLET BY MOUTH ONCE DAILY  . Cyanocobalamin (B-12 PO) Take by mouth daily.  . Mometasone Furo-Formoterol Fum (DULERA IN) Inhale 2 puffs into the lungs at bedtime.   . montelukast (SINGULAIR) 10 MG tablet Take 10 mg by mouth daily.  . nitroGLYCERIN (NITROSTAT) 0.4 MG SL tablet Place 0.4 mg under the tongue every 5 (five) minutes as needed for chest pain.  Marland Kitchen omeprazole (PRILOSEC) 40 MG capsule Take 40 mg by mouth daily.  . sacubitril-valsartan (ENTRESTO) 97-103 MG Take 1 tablet by mouth 2 (two) times daily.     Allergies:    Pneumovax 23 [pneumococcal vac polyvalent]   Social History   Socioeconomic History  . Marital status: Married    Spouse name: Not on file  . Number of children: Not on file  . Years of education: Not on file  . Highest education level: Not on file  Occupational History  . Not on file  Social Needs  . Financial resource strain: Not on file  . Food insecurity:    Worry: Not on file    Inability: Not on file  . Transportation needs:    Medical: Not on file    Non-medical: Not on file  Tobacco Use  . Smoking status: Former Smoker    Packs/day: 1.00    Years: 15.00    Pack years: 15.00    Types: Cigarettes    Last attempt to quit: 11/19/1976    Years since quitting: 41.8  . Smokeless tobacco: Former Neurosurgeon    Types: Chew    Quit date: 1980  Substance and Sexual Activity  . Alcohol use: No  Frequency: Never  . Drug use: No  . Sexual activity: Not Currently  Lifestyle  . Physical activity:    Days per week: Not on file    Minutes per session: Not on file  . Stress: Not on file  Relationships  . Social connections:    Talks on phone: Not on file    Gets together: Not on file    Attends religious service: Not on file    Active member of club or organization: Not on file    Attends meetings of clubs or organizations: Not on file    Relationship status: Not on file  Other Topics Concern  . Not on file  Social History Narrative  . Not on file     Family History: The patient's family history includes Diabetes in his mother; Heart attack in his maternal grandmother and maternal uncle; Hyperlipidemia in his mother; Hypertension in his mother. ROS:   Please see the history of present illness.    All other systems reviewed and are negative.  EKGs/Labs/Other Studies Reviewed:    The following studies were reviewed today:  EKG:  EKG ordered today.  The ekg ordered today demonstrates [facility] old anterior MI unchanges Nov 2018  Recent Labs: 10/15/2017: Hemoglobin 14.9;  Platelets 203 12/05/2017: BNP 58.3; BUN 19; Creatinine, Ser 1.02; Potassium 4.1; Sodium 141  Recent Lipid Panel No results found for: CHOL, TRIG, HDL, CHOLHDL, VLDL, LDLCALC, LDLDIRECT  Physical Exam:    VS:  BP 136/84 (BP Location: Right Arm, Patient Position: Sitting, Cuff Size: Normal)   Pulse (!) 54   Ht 6\' 2"  (1.88 m)   Wt 202 lb (91.6 kg)   SpO2 98%   BMI 25.94 kg/m     Wt Readings from Last 3 Encounters:  09/02/18 202 lb (91.6 kg)  03/05/18 202 lb 1.9 oz (91.7 kg)  12/05/17 204 lb (92.5 kg)     GEN:  Well nourished, well developed in no acute distress HEENT: Normal NECK: No JVD; No carotid bruits LYMPHATICS: No lymphadenopathy CARDIAC: RRR, no murmurs, rubs, gallops RESPIRATORY:  Clear to auscultation without rales, wheezing or rhonchi  ABDOMEN: Soft, non-tender, non-distended MUSCULOSKELETAL:  No edema; No deformity  SKIN: Warm and dry NEUROLOGIC:  Alert and oriented x 3 PSYCHIATRIC:  Normal affect    Signed, Norman Herrlich, MD  09/02/2018 9:24 AM    [facility]

## 2018-09-02 ENCOUNTER — Ambulatory Visit (INDEPENDENT_AMBULATORY_CARE_PROVIDER_SITE_OTHER): Payer: PPO | Admitting: Cardiology

## 2018-09-02 ENCOUNTER — Encounter: Payer: Self-pay | Admitting: Cardiology

## 2018-09-02 ENCOUNTER — Ambulatory Visit: Payer: PPO | Admitting: Cardiology

## 2018-09-02 VITALS — BP 136/84 | HR 54 | Ht 74.0 in | Wt 202.0 lb

## 2018-09-02 DIAGNOSIS — I10 Essential (primary) hypertension: Secondary | ICD-10-CM

## 2018-09-02 DIAGNOSIS — E782 Mixed hyperlipidemia: Secondary | ICD-10-CM

## 2018-09-02 DIAGNOSIS — Z7901 Long term (current) use of anticoagulants: Secondary | ICD-10-CM | POA: Diagnosis not present

## 2018-09-02 DIAGNOSIS — I2699 Other pulmonary embolism without acute cor pulmonale: Secondary | ICD-10-CM | POA: Diagnosis not present

## 2018-09-02 DIAGNOSIS — I25119 Atherosclerotic heart disease of native coronary artery with unspecified angina pectoris: Secondary | ICD-10-CM | POA: Diagnosis not present

## 2018-09-02 DIAGNOSIS — I255 Ischemic cardiomyopathy: Secondary | ICD-10-CM | POA: Diagnosis not present

## 2018-09-02 NOTE — Patient Instructions (Signed)
Medication Instructions:  Your physician recommends that you continue on your current medications as directed. Please refer to the Current Medication list given to you today.  If you need a refill on your cardiac medications before your next appointment, please call your pharmacy.   Lab work: None  Testing/Procedures: You had an EKG today.   Follow-Up: At [facility], you and your health needs are our priority.  As part of our continuing mission to provide you with exceptional heart care, we have created designated Provider Care Teams.  These Care Teams include your primary Cardiologist (physician) and Advanced Practice Providers (APPs -  Physician Assistants and Nurse Practitioners) who all work together to provide you with the care you need, when you need it. You will need a follow up appointment in 6 months.  Please call our office 2 months in advance to schedule this appointment.     

## 2018-09-30 ENCOUNTER — Observation Stay (HOSPITAL_BASED_OUTPATIENT_CLINIC_OR_DEPARTMENT_OTHER): Payer: PPO

## 2018-09-30 ENCOUNTER — Observation Stay (HOSPITAL_COMMUNITY)
Admission: EM | Admit: 2018-09-30 | Discharge: 2018-10-02 | Disposition: A | Discharge status: Home / Self Care | Payer: PPO | Attending: Internal Medicine | Admitting: Internal Medicine

## 2018-09-30 ENCOUNTER — Other Ambulatory Visit: Payer: Self-pay

## 2018-09-30 ENCOUNTER — Emergency Department (HOSPITAL_COMMUNITY): Payer: PPO

## 2018-09-30 ENCOUNTER — Encounter (HOSPITAL_COMMUNITY): Payer: Self-pay

## 2018-09-30 DIAGNOSIS — N183 Chronic kidney disease, stage 3 (moderate): Secondary | ICD-10-CM | POA: Diagnosis not present

## 2018-09-30 DIAGNOSIS — Z7902 Long term (current) use of antithrombotics/antiplatelets: Secondary | ICD-10-CM | POA: Insufficient documentation

## 2018-09-30 DIAGNOSIS — Z9842 Cataract extraction status, left eye: Secondary | ICD-10-CM | POA: Insufficient documentation

## 2018-09-30 DIAGNOSIS — K219 Gastro-esophageal reflux disease without esophagitis: Secondary | ICD-10-CM | POA: Diagnosis not present

## 2018-09-30 DIAGNOSIS — Z87891 Personal history of nicotine dependence: Secondary | ICD-10-CM | POA: Insufficient documentation

## 2018-09-30 DIAGNOSIS — Z833 Family history of diabetes mellitus: Secondary | ICD-10-CM | POA: Diagnosis not present

## 2018-09-30 DIAGNOSIS — Z8249 Family history of ischemic heart disease and other diseases of the circulatory system: Secondary | ICD-10-CM | POA: Diagnosis not present

## 2018-09-30 DIAGNOSIS — Z955 Presence of coronary angioplasty implant and graft: Secondary | ICD-10-CM | POA: Insufficient documentation

## 2018-09-30 DIAGNOSIS — I2510 Atherosclerotic heart disease of native coronary artery without angina pectoris: Secondary | ICD-10-CM

## 2018-09-30 DIAGNOSIS — I25119 Atherosclerotic heart disease of native coronary artery with unspecified angina pectoris: Secondary | ICD-10-CM | POA: Diagnosis present

## 2018-09-30 DIAGNOSIS — Z79899 Other long term (current) drug therapy: Secondary | ICD-10-CM | POA: Diagnosis not present

## 2018-09-30 DIAGNOSIS — I255 Ischemic cardiomyopathy: Secondary | ICD-10-CM

## 2018-09-30 DIAGNOSIS — J439 Emphysema, unspecified: Secondary | ICD-10-CM | POA: Diagnosis not present

## 2018-09-30 DIAGNOSIS — Z7982 Long term (current) use of aspirin: Secondary | ICD-10-CM | POA: Diagnosis not present

## 2018-09-30 DIAGNOSIS — I2699 Other pulmonary embolism without acute cor pulmonale: Secondary | ICD-10-CM

## 2018-09-30 DIAGNOSIS — Z7901 Long term (current) use of anticoagulants: Secondary | ICD-10-CM | POA: Insufficient documentation

## 2018-09-30 DIAGNOSIS — I129 Hypertensive chronic kidney disease with stage 1 through stage 4 chronic kidney disease, or unspecified chronic kidney disease: Secondary | ICD-10-CM | POA: Diagnosis not present

## 2018-09-30 DIAGNOSIS — Z887 Allergy status to serum and vaccine status: Secondary | ICD-10-CM | POA: Insufficient documentation

## 2018-09-30 DIAGNOSIS — Z86711 Personal history of pulmonary embolism: Secondary | ICD-10-CM | POA: Insufficient documentation

## 2018-09-30 DIAGNOSIS — R7303 Prediabetes: Secondary | ICD-10-CM | POA: Insufficient documentation

## 2018-09-30 DIAGNOSIS — Z9841 Cataract extraction status, right eye: Secondary | ICD-10-CM | POA: Diagnosis not present

## 2018-09-30 DIAGNOSIS — I252 Old myocardial infarction: Secondary | ICD-10-CM | POA: Diagnosis not present

## 2018-09-30 DIAGNOSIS — I10 Essential (primary) hypertension: Secondary | ICD-10-CM | POA: Diagnosis present

## 2018-09-30 DIAGNOSIS — R079 Chest pain, unspecified: Secondary | ICD-10-CM | POA: Diagnosis not present

## 2018-09-30 DIAGNOSIS — Z951 Presence of aortocoronary bypass graft: Secondary | ICD-10-CM | POA: Diagnosis not present

## 2018-09-30 DIAGNOSIS — E782 Mixed hyperlipidemia: Secondary | ICD-10-CM | POA: Diagnosis not present

## 2018-09-30 DIAGNOSIS — R0602 Shortness of breath: Secondary | ICD-10-CM | POA: Diagnosis not present

## 2018-09-30 DIAGNOSIS — Z7951 Long term (current) use of inhaled steroids: Secondary | ICD-10-CM | POA: Diagnosis not present

## 2018-09-30 HISTORY — DX: Other pulmonary embolism without acute cor pulmonale: I26.99

## 2018-09-30 LAB — ECHOCARDIOGRAM COMPLETE
Height: 74 in
Weight: 3244.8 oz

## 2018-09-30 LAB — BASIC METABOLIC PANEL
ANION GAP: 7 (ref 5–15)
BUN: 18 mg/dL (ref 8–23)
CALCIUM: 8.9 mg/dL (ref 8.9–10.3)
CO2: 26 mmol/L (ref 22–32)
Chloride: 106 mmol/L (ref 98–111)
Creatinine, Ser: 1.14 mg/dL (ref 0.61–1.24)
Glucose, Bld: 132 mg/dL — ABNORMAL HIGH (ref 70–99)
Potassium: 4.1 mmol/L (ref 3.5–5.1)
Sodium: 139 mmol/L (ref 135–145)

## 2018-09-30 LAB — CBC
HCT: 44.3 % (ref 39.0–52.0)
Hemoglobin: 14.7 g/dL (ref 13.0–17.0)
MCH: 30.6 pg (ref 26.0–34.0)
MCHC: 33.2 g/dL (ref 30.0–36.0)
MCV: 92.3 fL (ref 80.0–100.0)
NRBC: 0 % (ref 0.0–0.2)
PLATELETS: 194 10*3/uL (ref 150–400)
RBC: 4.8 MIL/uL (ref 4.22–5.81)
RDW: 13.6 % (ref 11.5–15.5)
WBC: 9.1 10*3/uL (ref 4.0–10.5)

## 2018-09-30 LAB — I-STAT TROPONIN, ED: TROPONIN I, POC: 0 ng/mL (ref 0.00–0.08)

## 2018-09-30 LAB — TROPONIN I: Troponin I: <0.03 ng/mL (ref <0.03)

## 2018-09-30 LAB — HEPARIN LEVEL (UNFRACTIONATED)
Heparin Unfractionated: 0.28 IU/mL — ABNORMAL LOW (ref 0.30–0.70)
Heparin Unfractionated: 0.4 IU/mL (ref 0.30–0.70)

## 2018-09-30 LAB — HIV ANTIBODY (ROUTINE TESTING W REFLEX): HIV Screen 4th Generation wRfx: NONREACTIVE

## 2018-09-30 MED ORDER — ALBUTEROL SULFATE (2.5 MG/3ML) 0.083% IN NEBU: 3.0000 mL | INHALATION_SOLUTION | Freq: Four times a day (QID) | RESPIRATORY_TRACT | Status: DC | PRN

## 2018-09-30 MED ORDER — HEPARIN (PORCINE) 25000 UT/250ML-% IV SOLN
1750.0000 [IU]/h | INTRAVENOUS | Status: DC
  Administered 2018-09-30: 1700 [IU]/h via INTRAVENOUS
  Administered 2018-09-30: 1500 [IU]/h via INTRAVENOUS
  Filled 2018-09-30 (×2): qty 250

## 2018-09-30 MED ORDER — SACUBITRIL-VALSARTAN 97-103 MG PO TABS: 1.0000 | ORAL_TABLET | Freq: Two times a day (BID) | ORAL | 1 refills | Status: DC

## 2018-09-30 MED ORDER — ONDANSETRON HCL 4 MG/2ML IJ SOLN: 4.0000 mg | Freq: Four times a day (QID) | INTRAMUSCULAR | Status: DC | PRN

## 2018-09-30 MED ORDER — PANTOPRAZOLE SODIUM 40 MG PO TBEC
80.0000 mg | DELAYED_RELEASE_TABLET | Freq: Every day | ORAL | Status: DC
  Administered 2018-09-30 – 2018-10-02 (×3): 80 mg via ORAL
  Filled 2018-09-30 (×4): qty 2

## 2018-09-30 MED ORDER — ACETAMINOPHEN 650 MG RE SUPP: 650.0000 mg | Freq: Four times a day (QID) | RECTAL | Status: DC | PRN

## 2018-09-30 MED ORDER — ASPIRIN EC 81 MG PO TBEC
81.0000 mg | DELAYED_RELEASE_TABLET | Freq: Every day | ORAL | Status: DC
  Administered 2018-09-30 – 2018-10-01 (×2): 81 mg via ORAL
  Filled 2018-09-30 (×3): qty 1

## 2018-09-30 MED ORDER — CLOPIDOGREL BISULFATE 75 MG PO TABS
75.0000 mg | ORAL_TABLET | Freq: Every day | ORAL | Status: DC
  Administered 2018-09-30 – 2018-10-01 (×2): 75 mg via ORAL
  Filled 2018-09-30 (×2): qty 1

## 2018-09-30 MED ORDER — ATORVASTATIN CALCIUM 20 MG PO TABS
80.0000 mg | ORAL_TABLET | Freq: Every evening | ORAL | Status: DC
  Administered 2018-09-30 – 2018-10-01 (×2): 80 mg via ORAL
  Filled 2018-09-30 (×2): qty 4

## 2018-09-30 MED ORDER — PERFLUTREN LIPID MICROSPHERE
1.0000 mL | INTRAVENOUS | Status: AC | PRN
  Administered 2018-09-30: 2 mL via INTRAVENOUS
  Filled 2018-09-30: qty 10

## 2018-09-30 MED ORDER — HEPARIN BOLUS VIA INFUSION
5000.0000 [IU] | Freq: Once | INTRAVENOUS | Status: AC
  Administered 2018-09-30: 5000 [IU] via INTRAVENOUS
  Filled 2018-09-30: qty 5000

## 2018-09-30 MED ORDER — ONDANSETRON HCL 4 MG PO TABS: 4.0000 mg | ORAL_TABLET | Freq: Four times a day (QID) | ORAL | Status: DC | PRN

## 2018-09-30 MED ORDER — ACETAMINOPHEN 325 MG PO TABS
650.0000 mg | ORAL_TABLET | Freq: Four times a day (QID) | ORAL | Status: DC | PRN
  Administered 2018-10-01: 650 mg via ORAL
  Filled 2018-09-30: qty 2

## 2018-09-30 MED ORDER — MONTELUKAST SODIUM 10 MG PO TABS
10.0000 mg | ORAL_TABLET | Freq: Every day | ORAL | Status: DC
  Administered 2018-09-30 – 2018-10-02 (×3): 10 mg via ORAL
  Filled 2018-09-30 (×3): qty 1

## 2018-09-30 MED ORDER — IOPAMIDOL (ISOVUE-370) INJECTION 76%
INTRAVENOUS | Status: AC
  Filled 2018-09-30: qty 100

## 2018-09-30 MED ORDER — SACUBITRIL-VALSARTAN 97-103 MG PO TABS
1.0000 | ORAL_TABLET | Freq: Two times a day (BID) | ORAL | Status: DC
  Administered 2018-09-30 – 2018-10-02 (×5): 1 via ORAL
  Filled 2018-09-30 (×5): qty 1

## 2018-09-30 MED ORDER — CARVEDILOL 12.5 MG PO TABS
18.7500 mg | ORAL_TABLET | Freq: Two times a day (BID) | ORAL | Status: DC
  Administered 2018-09-30 – 2018-10-02 (×5): 18.75 mg via ORAL
  Filled 2018-09-30 (×5): qty 2

## 2018-09-30 MED ORDER — IOPAMIDOL (ISOVUE-370) INJECTION 76%
100.0000 mL | Freq: Once | INTRAVENOUS | Status: AC | PRN
  Administered 2018-09-30: 100 mL via INTRAVENOUS

## 2018-09-30 MED ORDER — TRAMADOL HCL 50 MG PO TABS
50.0000 mg | ORAL_TABLET | Freq: Four times a day (QID) | ORAL | Status: DC | PRN
  Administered 2018-09-30 – 2018-10-01 (×2): 100 mg via ORAL
  Filled 2018-09-30 (×2): qty 2

## 2018-09-30 MED ORDER — ASPIRIN EC 81 MG PO TBEC: 81.0000 mg | DELAYED_RELEASE_TABLET | Freq: Every day | ORAL | 3 refills | Status: DC

## 2018-09-30 NOTE — Progress Notes (Signed)
Derek Hart is a 67 y.o. male with medical history significant of MI, LAD stent in 01/2017, PE back in Nov 2018.  Patient presents to the ED with c/o sharp pain in R chest, onset this evening.  Reports pain worsens when he takes a deep breath, feels SOB when he lays flat. CT angio of the chest shows Pulmonary embolus within segmental branches to the left lower lobe, and suggested within the pulmonary artery branch to the right middle lobe. CT evidence of right heart strain (RV/LV Ratio = 1.65) consistent with at least submassive (intermediate risk) PE.   On IV heparin, levels being subtherapeutic.  When levels are therapeutic transition to oral Xarelto or Eliquis.   Plan Echocardiogram to look for right heart strain and venous duplex of the lower extremities to evaluate for DVT. Transition to Xarelto or Eliquis on discharge.  Kathlen Mody, MD [phone]

## 2018-09-30 NOTE — ED Notes (Signed)
Patient returned from CT

## 2018-09-30 NOTE — H&P (Signed)
History and Physical    Derek Hart ZOX:[id number] DOB: 1950-12-05 DOA: 09/30/2018  PCP: Gordan Payment., MD  Patient coming from: Home  I have personally briefly reviewed patient's old medical records in [facility]  Chief Complaint: Chest pain  HPI: Derek Hart is a 67 y.o. male with medical history significant of MI, LAD stent in 01/2017, PE back in Nov 2018.  Patient presents to the ED with c/o sharp pain in R chest, onset this evening.  Reports pain worsens when he takes a deep breath, feels SOB when he lays flat.  Also worse when he turns to the right, bends or twists.  H/o prior PE and this feels the same according to patient.  He reports he stopped taking Xarelto even though his cardiologist wanted him to stay on it and told him he had a pretty high risk of recurrent PE stopping it.   ED Course: Patient has correctly self diagnosed as CT confirms he does indeed have R sided PE.  Vitals stable.   Review of Systems: As per HPI otherwise 10 point review of systems negative.   Past Medical History:  Diagnosis Date  . Allergic rhinitis 07/19/2016  . Asthma 07/19/2016  . Atherosclerotic heart disease of native coronary artery without angina pectoris 07/28/2015    CAD/ MI/ CABG  Overview:  [place].  MI LAD stent. 01/2017 Overview:  He had MI with staged PCI of LAD and later RCA and LCF 2015, EF 40% Overview:  Added automatically from request for surgery [id number]  . Cardiomyopathy (HCC) 07/19/2016   Overview:  EF 45% or 54% 01/2017  . Chest pain in adult 02/01/2017  . Chronic bronchitis (HCC)   . COPD (chronic obstructive pulmonary disease) (HCC) 04/26/2015   PFT-06/27/2015-minimal obstructive airways disease with insignificant response to bronchodilator, minimal diffusion defect, normal lung volumes. FEV1/FVC 0.76, DLCO 72%  . Coronary artery disease   . Dyspnea on exertion 04/26/2015  . Emphysema lung (HCC)    "treated for asthma but later found out it was emphysema" (10/15/2017)  .  Essential hypertension 07/19/2016  . GERD (gastroesophageal reflux disease)   . High risk medication use 07/19/2016  . Hyperlipidemia    "left lung"  . Hypertension   . Inguinal hernia 07/19/2016   Overview:  Mild bilateral.  . Lung nodule seen on imaging study 07/28/2015   chest x-ray report from 03/05/2014 at [facility] described a vague nodular density in the left upper lobe and recommended follow-up CT scan which was never done.   . LV dysfunction 05/30/2017  . MI (myocardial infarction) (HCC) 12/2013; 01/2017  . Mixed hyperlipidemia 07/19/2016  . 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 Prediabetes 07/19/2016  . Pulmonary embolism (HCC) 10/15/2017  . Stage 3 chronic kidney disease (HCC) 07/19/2016  . STEMI (ST elevation myocardial infarction) [facility]) 10/18/2017   Feb 2015    Past Surgical History:  Procedure Laterality Date  . CARDIAC CATHETERIZATION  11/2016  . CATARACT EXTRACTION W/ INTRAOCULAR LENS  IMPLANT, BILATERAL Bilateral   . CORONARY ANGIOPLASTY WITH STENT PLACEMENT  12/2013-02/2014   "2 + 3 stents"  . CORONARY ANGIOPLASTY WITH STENT PLACEMENT  01/2017   distal LAD  . KNEE ARTHROSCOPY Right 1982     reports that he quit smoking about 41 years ago. His smoking use included cigarettes. He has a 15.00 pack-year smoking history. He quit smokeless tobacco use about 39 years ago.  His smokeless tobacco use included chew. He reports that he does not drink  alcohol or use drugs.  Allergies  Allergen Reactions  . Pneumovax 23 [Pneumococcal Vac Polyvalent] Rash    Family History  Problem Relation Age of Onset  . Hyperlipidemia Mother   . Hypertension Mother   . Diabetes Mother   . Heart attack Maternal Uncle   . Heart attack Maternal Grandmother      Prior to Admission medications   Medication Sig Start Date End Date Taking? Authorizing Provider  albuterol (PROVENTIL HFA;VENTOLIN HFA) 108 (90 Base) MCG/ACT inhaler Inhale 1 puff into the lungs every 6 (six) hours as  needed for wheezing or shortness of breath.    [provider]  aspirin EC 81 MG tablet Take 1 tablet (81 mg total) by mouth daily. 03/05/18   Baldo Daub, MD  atorvastatin (LIPITOR) 80 MG tablet Take 80 mg by mouth every evening.  04/04/15   [provider]  carvedilol (COREG) 12.5 MG tablet TAKE 1 AND 1/2 tablets BY MOUTH TWICE (2) DAILY 07/24/18   Baldo Daub, MD  clopidogrel (PLAVIX) 75 MG tablet TAKE 1 TABLET BY MOUTH ONCE DAILY 05/29/18   Baldo Daub, MD  Cyanocobalamin (B-12 PO) Take by mouth daily.    [provider]  Mometasone Furo-Formoterol Fum (DULERA IN) Inhale 2 puffs into the lungs at bedtime.     [provider]  montelukast (SINGULAIR) 10 MG tablet Take 10 mg by mouth daily. 04/04/15   [provider]  nitroGLYCERIN (NITROSTAT) 0.4 MG SL tablet Place 0.4 mg under the tongue every 5 (five) minutes as needed for chest pain.    [provider]  omeprazole (PRILOSEC) 40 MG capsule Take 40 mg by mouth daily.    [provider]  sacubitril-valsartan (ENTRESTO) 97-103 MG Take 1 tablet by mouth 2 (two) times daily. 07/23/18   Georgeanna Lea, MD    Physical Exam: Vitals:   09/30/18 0315 09/30/18 0330 09/30/18 0344 09/30/18 0345  BP: 115/86 117/81  118/80  Pulse: 73 74  78  Resp: 18 18  15   Temp:      TempSrc:      SpO2: 94% 95%  97%  Weight:   88.5 kg     Constitutional: NAD, calm, comfortable Eyes: PERRL, lids and conjunctivae normal ENMT: Mucous membranes are moist. Posterior pharynx clear of any exudate or lesions.Normal dentition.  Neck: normal, supple, no masses, no thyromegaly Respiratory: clear to auscultation bilaterally, no wheezing, no crackles. Normal respiratory effort. No accessory muscle use.  Cardiovascular: Regular rate and rhythm, no murmurs / rubs / gallops. No extremity edema. 2+ pedal pulses. No carotid bruits.  Abdomen: no tenderness, no masses palpated. No hepatosplenomegaly. Bowel  sounds positive.  Musculoskeletal: no clubbing / cyanosis. No joint deformity upper and lower extremities. Good ROM, no contractures. Normal muscle tone.  Skin: no rashes, lesions, ulcers. No induration Neurologic: CN 2-12 grossly intact. Sensation intact, DTR normal. Strength 5/5 in all 4.  Psychiatric: Normal judgment and insight. Alert and oriented x 3. Normal mood.    Labs on Admission: I have personally reviewed following labs and imaging studies  CBC: Recent Labs  Lab 09/30/18 0054  WBC 9.1  HGB 14.7  HCT 44.3  MCV 92.3  PLT 194   Basic Metabolic Panel: Recent Labs  Lab 09/30/18 0054  NA 139  K 4.1  CL 106  CO2 26  GLUCOSE 132*  BUN 18  CREATININE 1.14  CALCIUM 8.9   GFR: Estimated Creatinine Clearance: 72.1 mL/min (by C-G formula based  on SCr of 1.14 mg/dL). Liver Function Tests: No results for input(s): AST, ALT, ALKPHOS, BILITOT, PROT, ALBUMIN in the last 168 hours. No results for input(s): LIPASE, AMYLASE in the last 168 hours. No results for input(s): AMMONIA in the last 168 hours. Coagulation Profile: No results for input(s): INR, PROTIME in the last 168 hours. Cardiac Enzymes: No results for input(s): CKTOTAL, CKMB, CKMBINDEX, TROPONINI in the last 168 hours. BNP (last 3 results) No results for input(s): PROBNP in the last 8760 hours. HbA1C: No results for input(s): HGBA1C in the last 72 hours. CBG: No results for input(s): GLUCAP in the last 168 hours. Lipid Profile: No results for input(s): CHOL, HDL, LDLCALC, TRIG, CHOLHDL, LDLDIRECT in the last 72 hours. Thyroid Function Tests: No results for input(s): TSH, T4TOTAL, FREET4, T3FREE, THYROIDAB in the last 72 hours. Anemia Panel: No results for input(s): VITAMINB12, FOLATE, FERRITIN, TIBC, IRON, RETICCTPCT in the last 72 hours. Urine analysis: No results found for: COLORURINE, APPEARANCEUR, LABSPEC, PHURINE, GLUCOSEU, HGBUR, BILIRUBINUR, KETONESUR, PROTEINUR, UROBILINOGEN, NITRITE,  LEUKOCYTESUR  Radiological Exams on Admission: Dg Chest 2 View  Result Date: 09/30/2018 CLINICAL DATA:  Acute onset of right-sided chest pain. EXAM: CHEST - 2 VIEW COMPARISON:  Chest radiograph and CTA of the chest performed 10/15/2017 FINDINGS: The lungs are well-aerated. Mild bibasilar opacities likely reflect atelectasis. There is no evidence of pleural effusion or pneumothorax. The heart is normal in size; the mediastinal contour is within normal limits. No acute osseous abnormalities are seen. IMPRESSION: Mild bibasilar opacities likely reflect atelectasis; lungs otherwise clear. Electronically Signed   By: Roanna Raider M.D.   On: 09/30/2018 01:24   Ct Angio Chest Pe W Or Wo Contrast  Result Date: 09/30/2018 CLINICAL DATA:  Acute onset of generalized chest pain and shortness of breath. EXAM: CT ANGIOGRAPHY CHEST WITH CONTRAST TECHNIQUE: Multidetector CT imaging of the chest was performed using the standard protocol during bolus administration of intravenous contrast. Multiplanar CT image reconstructions and MIPs were obtained to evaluate the vascular anatomy. CONTRAST:  79 mL ISOVUE-370 IOPAMIDOL (ISOVUE-370) INJECTION 76% COMPARISON:  Chest radiograph performed earlier today at 12:57 a.m., and CTA of the chest performed 10/15/2017 FINDINGS: Cardiovascular: Pulmonary embolus is noted within segmental branches to the left lower lobe, and is suggested within the pulmonary artery branch to the right middle lobe. The RV/LV ratio of 1.65 raises concern for right heart strain and at least submassive pulmonary embolus. Diffuse coronary artery calcifications are seen. The heart is borderline normal in size. The thoracic aorta is grossly unremarkable. The great vessels are within normal limits. Mediastinum/Nodes: Scattered mediastinal nodes remain normal in size. No mediastinal lymphadenopathy is seen. No pericardial effusion is identified. The visualized portions of the thyroid gland are unremarkable. No  axillary lymphadenopathy is seen. Lungs/Pleura: Patchy bibasilar airspace opacities likely reflect atelectasis. No significant pleural effusion or pneumothorax is seen. No masses are identified. Upper Abdomen: The visualized portions of the liver and spleen are unremarkable. Musculoskeletal: No acute osseous abnormalities are identified. The visualized musculature is unremarkable in appearance. Review of the MIP images confirms the above findings. IMPRESSION: 1. Pulmonary embolus within segmental branches to the left lower lobe, and suggested within the pulmonary artery branch to the right middle lobe. CT evidence of right heart strain (RV/LV Ratio = 1.65) consistent with at least submassive (intermediate risk) PE. The presence of right heart strain has been associated with an increased risk of morbidity and mortality. Please activate Code PE by paging [phone]. 2. Patchy bibasilar airspace opacities likely  reflect atelectasis. 3. Diffuse coronary artery calcifications seen. Critical Value/emergent results were called by telephone at the time of interpretation on 09/30/2018 at 3:10 am to Dr. Wilkie Aye, who verbally acknowledged these results. Electronically Signed   By: Roanna Raider M.D.   On: 09/30/2018 03:10    EKG: Independently reviewed.  Assessment/Plan Principal Problem:   Recurrent pulmonary embolism (HCC) Active Problems:   Coronary artery disease involving native coronary artery of native heart with angina pectoris (HCC)   Essential hypertension    1. Recurrent PE - 1. Heparin gtt 2. 2d echo 3. BLE venous duplex to find source DVT 4. Likely will need to be on permanent Xarelto now (had actually been recommended previously but patient took self off of this) 5. Tele monitor 6. Serial trops 2. CAD - 1. Continue ASA / Plavix 1. Need to touch base with cards regarding ongoing use of ASA and plavix when patient being put on Xarelto.  Patient sees Dr. Dulce Sellar. 2. Continue statin 3. HTN  - 1. Continue home BP meds 2. Continue Entresto, beta blocker  DVT prophylaxis: Heparin gtt Code Status: Full Family Communication: Family at bedside Disposition Plan: Home after admit Consults called: None Admission status: Place in obs - convert to IP if not discharged later today    Hillary Bow. DO [facility] Pager [phone] Only works nights!  If 7AM-7PM, please contact the primary day team physician taking care of patient  [email or web address] Password [email or web address]  09/30/2018, 4:04 AM

## 2018-09-30 NOTE — Care Management (Signed)
#    3.  S/W   DIANA M.  @ [facility] RX # [phone]    1. ELIQUIS   5 MG BID COVER- YES CO-PAY- $  47.00 TIER- 2 DRUG PRIOR APPROVAL- NO  2.  XARELTO    20 MG DAILY COVER- YES CO-PAY- $ 47.00 TIER- 2 DRUG PRIOR APPROVAL- NO NO DEDUCTIBLE  PREFERRED PHARMACY : [facility]  HAS A RETAIL 90 DAY SUPPLY

## 2018-09-30 NOTE — ED Notes (Signed)
Patient transported to CT 

## 2018-09-30 NOTE — Progress Notes (Signed)
  Echocardiogram 2D Echocardiogram has been performed.  Margo Lama L Androw 09/30/2018, 11:31 AM

## 2018-09-30 NOTE — Progress Notes (Signed)
*  Preliminary Results* Bilateral lower extremity venous duplex completed. Bilateral lower extremities are negative for deep vein thrombosis. There is no evidence of Baker's cyst bilaterally.  09/30/2018 11:31 AM  Derek Hart Clare Gandy

## 2018-09-30 NOTE — ED Provider Notes (Signed)
[facility] EMERGENCY DEPARTMENT Provider Note   CSN: [id number] Arrival date & time: 09/30/18  0041     History   Chief Complaint Chief Complaint  Patient presents with  . Chest Pain    HPI Derek Hart is a 67 y.o. male.  Patient presents to the emergency department for evaluation of chest pain.  Patient reports a sharp pain in the right chest that began this evening.  He reports that the pain worsens when he takes a deep breath, feels short of breath when he lies flat.  The pain also, however, worsens when he turns to the right, bends or twists.  He denies injury.  Patient reports a previous history of PE.  He reports symptoms were the same.  He stopped taking his Xarelto.     Past Medical History:  Diagnosis Date  . Allergic rhinitis 07/19/2016  . Asthma 07/19/2016  . Atherosclerotic heart disease of native coronary artery without angina pectoris 07/28/2015    CAD/ MI/ CABG  Overview:  [place].  MI LAD stent. 01/2017 Overview:  He had MI with staged PCI of LAD and later RCA and LCF 2015, EF 40% Overview:  Added automatically from request for surgery [id number]  . Cardiomyopathy (HCC) 07/19/2016   Overview:  EF 45% or 54% 01/2017  . Chest pain in adult 02/01/2017  . Chronic bronchitis (HCC)   . COPD (chronic obstructive pulmonary disease) (HCC) 04/26/2015   PFT-06/27/2015-minimal obstructive airways disease with insignificant response to bronchodilator, minimal diffusion defect, normal lung volumes. FEV1/FVC 0.76, DLCO 72%  . Coronary artery disease   . Dyspnea on exertion 04/26/2015  . Emphysema lung (HCC)    "treated for asthma but later found out it was emphysema" (10/15/2017)  . Essential hypertension 07/19/2016  . GERD (gastroesophageal reflux disease)   . High risk medication use 07/19/2016  . Hyperlipidemia    "left lung"  . Hypertension   . Inguinal hernia 07/19/2016   Overview:  Mild bilateral.  . Lung nodule seen on imaging study 07/28/2015   chest x-ray  report from 03/05/2014 at [facility] described a vague nodular density in the left upper lobe and recommended follow-up CT scan which was never done.   . LV dysfunction 05/30/2017  . MI (myocardial infarction) (HCC) 12/2013; 01/2017  . Mixed hyperlipidemia 07/19/2016  . 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 Prediabetes 07/19/2016  . Pulmonary embolism (HCC) 10/15/2017  . Stage 3 chronic kidney disease (HCC) 07/19/2016  . STEMI (ST elevation myocardial infarction) [facility]) 10/18/2017   Feb 2015    Patient Active Problem List   Diagnosis Date Noted  . Chronic anticoagulation 10/21/2017  . STEMI (ST elevation myocardial infarction) (HCC) 10/18/2017  . Pulmonary embolism (HCC) 10/15/2017  . Ischemic cardiomyopathy 05/30/2017  . 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 Chest pain in adult 02/01/2017  . Allergic rhinitis 07/19/2016  . Asthma 07/19/2016  . Essential hypertension 07/19/2016  . High risk medication use 07/19/2016  . Inguinal hernia 07/19/2016  . Mixed hyperlipidemia 07/19/2016  . Prediabetes 07/19/2016  . Stage 3 chronic kidney disease (HCC) 07/19/2016  . Lung nodule seen on imaging study 07/28/2015  . Coronary artery disease involving native coronary artery of native heart with angina pectoris (HCC) 07/28/2015  . Dyspnea on exertion 04/26/2015  . COPD (chronic obstructive pulmonary disease) (HCC) 04/26/2015    Past Surgical History:  Procedure Laterality Date  . CARDIAC CATHETERIZATION  11/2016  . CATARACT EXTRACTION W/ INTRAOCULAR LENS  IMPLANT, BILATERAL Bilateral   .  CORONARY ANGIOPLASTY WITH STENT PLACEMENT  12/2013-02/2014   "2 + 3 stents"  . CORONARY ANGIOPLASTY WITH STENT PLACEMENT  01/2017   distal LAD  . KNEE ARTHROSCOPY Right 1982        Home Medications    Prior to Admission medications   Medication Sig Start Date End Date Taking? Authorizing Provider  albuterol (PROVENTIL HFA;VENTOLIN HFA) 108 (90 Base) MCG/ACT inhaler Inhale 1 puff into  the lungs every 6 (six) hours as needed for wheezing or shortness of breath.    [provider]  aspirin EC 81 MG tablet Take 1 tablet (81 mg total) by mouth daily. 03/05/18   Baldo Daub, MD  atorvastatin (LIPITOR) 80 MG tablet Take 80 mg by mouth every evening.  04/04/15   [provider]  carvedilol (COREG) 12.5 MG tablet TAKE 1 AND 1/2 tablets BY MOUTH TWICE (2) DAILY 07/24/18   Baldo Daub, MD  clopidogrel (PLAVIX) 75 MG tablet TAKE 1 TABLET BY MOUTH ONCE DAILY 05/29/18   Baldo Daub, MD  Cyanocobalamin (B-12 PO) Take by mouth daily.    [provider]  Mometasone Furo-Formoterol Fum (DULERA IN) Inhale 2 puffs into the lungs at bedtime.     [provider]  montelukast (SINGULAIR) 10 MG tablet Take 10 mg by mouth daily. 04/04/15   [provider]  nitroGLYCERIN (NITROSTAT) 0.4 MG SL tablet Place 0.4 mg under the tongue every 5 (five) minutes as needed for chest pain.    [provider]  omeprazole (PRILOSEC) 40 MG capsule Take 40 mg by mouth daily.    [provider]  sacubitril-valsartan (ENTRESTO) 97-103 MG Take 1 tablet by mouth 2 (two) times daily. 07/23/18   Georgeanna Lea, MD    Family History Family History  Problem Relation Age of Onset  . Hyperlipidemia Mother   . Hypertension Mother   . Diabetes Mother   . Heart attack Maternal Uncle   . Heart attack Maternal Grandmother     Social History Social History   Tobacco Use  . Smoking status: Former Smoker    Packs/day: 1.00    Years: 15.00    Pack years: 15.00    Types: Cigarettes    Last attempt to quit: 11/19/1976    Years since quitting: 41.8  . Smokeless tobacco: Former Neurosurgeon    Types: Chew    Quit date: 1980  Substance Use Topics  . Alcohol use: No    Frequency: Never  . Drug use: No     Allergies   Pneumovax 23 [pneumococcal vac polyvalent]   Review of Systems Review of Systems  Respiratory: Positive for shortness of breath.     Cardiovascular: Positive for chest pain.  All other systems reviewed and are negative.    Physical Exam Updated Vital Signs BP 115/86   Pulse 73   Temp 98.3 F (36.8 C) (Oral)   Resp 18   SpO2 94%   Physical Exam  Constitutional: He is oriented to person, place, and time. He appears well-developed and well-nourished. No distress.  HENT:  Head: Normocephalic and atraumatic.  Right Ear: Hearing normal.  Left Ear: Hearing normal.  Nose: Nose normal.  Mouth/Throat: Oropharynx is clear and moist and mucous membranes are normal.  Eyes: Pupils are equal, round, and reactive to light. Conjunctivae and EOM are normal.  Neck: Normal range of motion. Neck supple.  Cardiovascular: Regular rhythm, S1 normal and S2 normal. Exam reveals no gallop and no friction rub.  No  murmur heard. Pulmonary/Chest: Effort normal and breath sounds normal. No respiratory distress. He exhibits no tenderness.  Abdominal: Soft. Normal appearance and bowel sounds are normal. There is no hepatosplenomegaly. There is no tenderness. There is no rebound, no guarding, no tenderness at McBurney's point and negative Murphy's sign. No hernia.  Musculoskeletal: Normal range of motion.  Neurological: He is alert and oriented to person, place, and time. He has normal strength. No cranial nerve deficit or sensory deficit. Coordination normal. GCS eye subscore is 4. GCS verbal subscore is 5. GCS motor subscore is 6.  Skin: Skin is warm, dry and intact. No rash noted. No cyanosis.  Psychiatric: He has a normal mood and affect. His speech is normal and behavior is normal. Thought content normal.  Nursing note and vitals reviewed.    ED Treatments / Results  Labs (all labs ordered are listed, but only abnormal results are displayed) Labs Reviewed  BASIC METABOLIC PANEL - Abnormal; Notable for the following components:      Result Value   Glucose, Bld 132 (*)    All other components within normal limits  CBC  I-STAT  TROPONIN, ED    EKG EKG Interpretation  Date/Time:  Tuesday September 30 2018 00:49:49 EST Ventricular Rate:  85 PR Interval:  200 QRS Duration: 98 QT Interval:  364 QTC Calculation: 433 R Axis:   -35 Text Interpretation:  Normal sinus rhythm Possible Left atrial enlargement Left axis deviation Inferior infarct , age undetermined Anterolateral infarct , age undetermined Abnormal ECG Confirmed by Gilda Crease [phone]) on 09/30/2018 1:09:40 AM   Radiology Dg Chest 2 View  Result Date: 09/30/2018 CLINICAL DATA:  Acute onset of right-sided chest pain. EXAM: CHEST - 2 VIEW COMPARISON:  Chest radiograph and CTA of the chest performed 10/15/2017 FINDINGS: The lungs are well-aerated. Mild bibasilar opacities likely reflect atelectasis. There is no evidence of pleural effusion or pneumothorax. The heart is normal in size; the mediastinal contour is within normal limits. No acute osseous abnormalities are seen. IMPRESSION: Mild bibasilar opacities likely reflect atelectasis; lungs otherwise clear. Electronically Signed   By: Roanna Raider M.D.   On: 09/30/2018 01:24   Ct Angio Chest Pe W Or Wo Contrast  Result Date: 09/30/2018 CLINICAL DATA:  Acute onset of generalized chest pain and shortness of breath. EXAM: CT ANGIOGRAPHY CHEST WITH CONTRAST TECHNIQUE: Multidetector CT imaging of the chest was performed using the standard protocol during bolus administration of intravenous contrast. Multiplanar CT image reconstructions and MIPs were obtained to evaluate the vascular anatomy. CONTRAST:  79 mL ISOVUE-370 IOPAMIDOL (ISOVUE-370) INJECTION 76% COMPARISON:  Chest radiograph performed earlier today at 12:57 a.m., and CTA of the chest performed 10/15/2017 FINDINGS: Cardiovascular: Pulmonary embolus is noted within segmental branches to the left lower lobe, and is suggested within the pulmonary artery branch to the right middle lobe. The RV/LV ratio of 1.65 raises concern for right heart strain and  at least submassive pulmonary embolus. Diffuse coronary artery calcifications are seen. The heart is borderline normal in size. The thoracic aorta is grossly unremarkable. The great vessels are within normal limits. Mediastinum/Nodes: Scattered mediastinal nodes remain normal in size. No mediastinal lymphadenopathy is seen. No pericardial effusion is identified. The visualized portions of the thyroid gland are unremarkable. No axillary lymphadenopathy is seen. Lungs/Pleura: Patchy bibasilar airspace opacities likely reflect atelectasis. No significant pleural effusion or pneumothorax is seen. No masses are identified. Upper Abdomen: The visualized portions of the liver and spleen are unremarkable. Musculoskeletal: No acute osseous  abnormalities are identified. The visualized musculature is unremarkable in appearance. Review of the MIP images confirms the above findings. IMPRESSION: 1. Pulmonary embolus within segmental branches to the left lower lobe, and suggested within the pulmonary artery branch to the right middle lobe. CT evidence of right heart strain (RV/LV Ratio = 1.65) consistent with at least submassive (intermediate risk) PE. The presence of right heart strain has been associated with an increased risk of morbidity and mortality. Please activate Code PE by paging [phone]. 2. Patchy bibasilar airspace opacities likely reflect atelectasis. 3. Diffuse coronary artery calcifications seen. Critical Value/emergent results were called by telephone at the time of interpretation on 09/30/2018 at 3:10 am to Dr. Wilkie Aye, who verbally acknowledged these results. Electronically Signed   By: Roanna Raider M.D.   On: 09/30/2018 03:10    Procedures Procedures (including critical care time)  Medications Ordered in ED Medications  iopamidol (ISOVUE-370) 76 % injection 100 mL (100 mLs Intravenous Contrast Given 09/30/18 0239)     Initial Impression / Assessment and Plan / ED Course  I have reviewed the  triage vital signs and the nursing notes.  Pertinent labs & imaging results that were available during my care of the patient were reviewed by me and considered in my medical decision making (see chart for details).    Presents to the emergency department for evaluation of right-sided chest pain that began this evening.  Patient reports that he has a sharp pain in the right chest that worsens with taking a deep breath.  He feels slightly short of breath.  Patient does have a history of PE.  He reports that he was on Xarelto for some time after his PE, but has been off of it for quite some time.  He does report that his symptoms feel similar.  Patient's vital signs are normal.  No fever, normal heart rate, normal blood pressure.  Oxygen saturation 94%, but no significant hypoxia.  EKG unchanged from previous.  CT scan shows submassive PE with right heart strain.  Patient initiated on heparin, will be admitted.  CRITICAL CARE Performed by: Gilda Crease   Total critical care time: 35 minutes  Critical care time was exclusive of separately billable procedures and treating other patients.  Critical care was necessary to treat or prevent imminent or life-threatening deterioration.  Critical care was time spent personally by me on the following activities: development of treatment plan with patient and/or surrogate as well as nursing, discussions with consultants, evaluation of patient's response to treatment, examination of patient, obtaining history from patient or surrogate, ordering and performing treatments and interventions, ordering and review of laboratory studies, ordering and review of radiographic studies, pulse oximetry and re-evaluation of patient's condition.   Final Clinical Impressions(s) / ED Diagnoses   Final diagnoses:  None    ED Discharge Orders    None       Pascha Fogal, Canary Brim, MD 09/30/18 [phone]

## 2018-09-30 NOTE — Progress Notes (Signed)
ANTICOAGULATION CONSULT NOTE - Initial Consult  Pharmacy Consult for Heparin Indication: pulmonary embolus  Allergies  Allergen Reactions  . Pneumovax 23 [Pneumococcal Vac Polyvalent] Rash    Patient Measurements: Weight: 195 lb (88.5 kg)  Vital Signs: Temp: 98.3 F (36.8 C) (11/12 0047) Temp Source: Oral (11/12 0047) BP: 115/86 (11/12 0315) Pulse Rate: 73 (11/12 0315)  Labs: Recent Labs    09/30/18 0054  HGB 14.7  HCT 44.3  PLT 194  CREATININE 1.14    Estimated Creatinine Clearance: 72.1 mL/min (by C-G formula based on SCr of 1.14 mg/dL).   Medical History: Past Medical History:  Diagnosis Date  . Allergic rhinitis 07/19/2016  . Asthma 07/19/2016  . Atherosclerotic heart disease of native coronary artery without angina pectoris 07/28/2015    CAD/ MI/ CABG  Overview:  [place].  MI LAD stent. 01/2017 Overview:  He had MI with staged PCI of LAD and later RCA and LCF 2015, EF 40% Overview:  Added automatically from request for surgery [id number]  . Cardiomyopathy (HCC) 07/19/2016   Overview:  EF 45% or 54% 01/2017  . Chest pain in adult 02/01/2017  . Chronic bronchitis (HCC)   . COPD (chronic obstructive pulmonary disease) (HCC) 04/26/2015   PFT-06/27/2015-minimal obstructive airways disease with insignificant response to bronchodilator, minimal diffusion defect, normal lung volumes. FEV1/FVC 0.76, DLCO 72%  . Coronary artery disease   . Dyspnea on exertion 04/26/2015  . Emphysema lung (HCC)    "treated for asthma but later found out it was emphysema" (10/15/2017)  . Essential hypertension 07/19/2016  . GERD (gastroesophageal reflux disease)   . High risk medication use 07/19/2016  . Hyperlipidemia    "left lung"  . Hypertension   . Inguinal hernia 07/19/2016   Overview:  Mild bilateral.  . Lung nodule seen on imaging study 07/28/2015   chest x-ray report from 03/05/2014 at [facility] described a vague nodular density in the left upper lobe and recommended follow-up CT scan  which was never done.   . LV dysfunction 05/30/2017  . MI (myocardial infarction) (HCC) 12/2013; 01/2017  . Mixed hyperlipidemia 07/19/2016  . 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 Prediabetes 07/19/2016  . Pulmonary embolism (HCC) 10/15/2017  . Stage 3 chronic kidney disease (HCC) 07/19/2016  . STEMI (ST elevation myocardial infarction) [facility]) 10/18/2017   Feb 2015    Medications:  No current facility-administered medications on file prior to encounter.    Current Outpatient Medications on File Prior to Encounter  Medication Sig Dispense Refill  . albuterol (PROVENTIL HFA;VENTOLIN HFA) 108 (90 Base) MCG/ACT inhaler Inhale 1 puff into the lungs every 6 (six) hours as needed for wheezing or shortness of breath.    Marland Kitchen aspirin EC 81 MG tablet Take 1 tablet (81 mg total) by mouth daily. 30 tablet 0  . atorvastatin (LIPITOR) 80 MG tablet Take 80 mg by mouth every evening.   2  . carvedilol (COREG) 12.5 MG tablet TAKE 1 AND 1/2 tablets BY MOUTH TWICE (2) DAILY 270 tablet 2  . clopidogrel (PLAVIX) 75 MG tablet TAKE 1 TABLET BY MOUTH ONCE DAILY 90 tablet 2  . Cyanocobalamin (B-12 PO) Take by mouth daily.    . Mometasone Furo-Formoterol Fum (DULERA IN) Inhale 2 puffs into the lungs at bedtime.     . montelukast (SINGULAIR) 10 MG tablet Take 10 mg by mouth daily.  2  . nitroGLYCERIN (NITROSTAT) 0.4 MG SL tablet Place 0.4 mg under the tongue every 5 (five) minutes as needed for chest  pain.    . omeprazole (PRILOSEC) 40 MG capsule Take 40 mg by mouth daily.    . sacubitril-valsartan (ENTRESTO) 97-103 MG Take 1 tablet by mouth 2 (two) times daily. 60 tablet 1     Assessment: 67 y.o. male with h/o PE, now with new B PE, for heparin  Goal of Therapy:  Heparin level 0.3-0.7 units/ml Monitor platelets by anticoagulation protocol: Yes   Plan:  Heparin 5000 units IV bolus, then start heparin 1500 units/hr Check heparin level in 6 hours.   Eddie Candle 09/30/2018,3:46 AM

## 2018-09-30 NOTE — Progress Notes (Signed)
ANTICOAGULATION CONSULT NOTE   Pharmacy Consult for Heparin Indication: pulmonary embolus  Allergies  Allergen Reactions  . Pneumovax 23 [Pneumococcal Vac Polyvalent] Rash    Patient Measurements: Height: 6\' 2"  (188 cm) Weight: 202 lb 12.8 oz (92 kg)(Scale B) IBW/kg (Calculated) : 82.2  Vital Signs: Temp: 97.4 F (36.3 C) (11/12 0723) Temp Source: Oral (11/12 0723) BP: 115/87 (11/12 0723) Pulse Rate: 71 (11/12 0723)  Labs: Recent Labs    09/30/18 0054 09/30/18 0434 09/30/18 1027  HGB 14.7  --   --   HCT 44.3  --   --   PLT 194  --   --   HEPARINUNFRC  --   --  0.28*  CREATININE 1.14  --   --   TROPONINI  --  <0.03 <0.03    Estimated Creatinine Clearance: 72.1 mL/min (by C-G formula based on SCr of 1.14 mg/dL).   Medical History: Past Medical History:  Diagnosis Date  . Allergic rhinitis 07/19/2016  . Asthma 07/19/2016  . Atherosclerotic heart disease of native coronary artery without angina pectoris 07/28/2015    CAD/ MI/ CABG  Overview:  [place].  MI LAD stent. 01/2017 Overview:  He had MI with staged PCI of LAD and later RCA and LCF 2015, EF 40% Overview:  Added automatically from request for surgery [id number]  . Cardiomyopathy (HCC) 07/19/2016   Overview:  EF 45% or 54% 01/2017  . Chest pain in adult 02/01/2017  . Chronic bronchitis (HCC)   . COPD (chronic obstructive pulmonary disease) (HCC) 04/26/2015   PFT-06/27/2015-minimal obstructive airways disease with insignificant response to bronchodilator, minimal diffusion defect, normal lung volumes. FEV1/FVC 0.76, DLCO 72%  . Coronary artery disease   . Dyspnea on exertion 04/26/2015  . Emphysema lung (HCC)    "treated for asthma but later found out it was emphysema" (10/15/2017)  . Essential hypertension 07/19/2016  . GERD (gastroesophageal reflux disease)   . High risk medication use 07/19/2016  . Hyperlipidemia    "left lung"  . Hypertension   . Inguinal hernia 07/19/2016   Overview:  Mild bilateral.  . Lung nodule  seen on imaging study 07/28/2015   chest x-ray report from 03/05/2014 at [facility] described a vague nodular density in the left upper lobe and recommended follow-up CT scan which was never done.   . LV dysfunction 05/30/2017  . MI (myocardial infarction) (HCC) 12/2013; 01/2017  . Mixed hyperlipidemia 07/19/2016  . 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 Prediabetes 07/19/2016  . Pulmonary embolism (HCC) 10/15/2017  . Stage 3 chronic kidney disease (HCC) 07/19/2016  . STEMI (ST elevation myocardial infarction) [facility]) 10/18/2017   Feb 2015    Medications:  No current facility-administered medications on file prior to encounter.    Current Outpatient Medications on File Prior to Encounter  Medication Sig Dispense Refill  . albuterol (PROVENTIL HFA;VENTOLIN HFA) 108 (90 Base) MCG/ACT inhaler Inhale 1 puff into the lungs every 6 (six) hours as needed for wheezing or shortness of breath.    Marland Kitchen aspirin EC 81 MG tablet Take 1 tablet (81 mg total) by mouth daily. 30 tablet 0  . atorvastatin (LIPITOR) 80 MG tablet Take 80 mg by mouth every evening.   2  . carvedilol (COREG) 12.5 MG tablet TAKE 1 AND 1/2 tablets BY MOUTH TWICE (2) DAILY (Patient taking differently: Take 18.75 mg by mouth 2 (two) times daily with a meal. ) 270 tablet 2  . clopidogrel (PLAVIX) 75 MG tablet TAKE 1 TABLET BY MOUTH ONCE  DAILY (Patient taking differently: Take 75 mg by mouth every evening. ) 90 tablet 2  . Cyanocobalamin (B-12 PO) Take 1 tablet by mouth daily.     . mometasone-formoterol (DULERA) 200-5 MCG/ACT AERO Inhale 2 puffs into the lungs every evening.    . montelukast (SINGULAIR) 10 MG tablet Take 10 mg by mouth daily.  2  . nitroGLYCERIN (NITROSTAT) 0.4 MG SL tablet Place 0.4 mg under the tongue every 5 (five) minutes as needed for chest pain.    Marland Kitchen omeprazole (PRILOSEC) 40 MG capsule Take 40 mg by mouth daily.    . sacubitril-valsartan (ENTRESTO) 97-103 MG Take 1 tablet by mouth 2 (two) times daily. 60 tablet  1  . [DISCONTINUED] Mometasone Furo-Formoterol Fum (DULERA IN) Inhale 2 puffs into the lungs at bedtime.        Assessment: 67 y.o. male with h/o PE, now with new B PE, for heparin   Initial heparin level just below goal range.  No overt bleeding or complications noted.  No known issues with IV infusion.  Goal of Therapy:  Heparin level 0.3-0.7 units/ml Monitor platelets by anticoagulation protocol: Yes   Plan:  Increase IV heparin to 1700 units/hr. Recheck heparin level in 6 hrs Daily heparin level and CBC. F/u plans to change to oral anticoagulation.  Jenetta Downer, [facility] Clinical Pharmacist Phone [phone]  09/30/2018 11:41 AM

## 2018-09-30 NOTE — Progress Notes (Signed)
ANTICOAGULATION CONSULT NOTE   Pharmacy Consult for Heparin Indication: pulmonary embolus  Allergies  Allergen Reactions  . Pneumovax 23 [Pneumococcal Vac Polyvalent] Rash    Patient Measurements: Height: 6\' 2"  (188 cm) Weight: 202 lb 12.8 oz (92 kg)(Scale B) IBW/kg (Calculated) : 82.2  Vital Signs: Temp: 97.6 F (36.4 C) (11/12 1705) Temp Source: Oral (11/12 1705) BP: 127/87 (11/12 1705) Pulse Rate: 76 (11/12 1705)  Labs: Recent Labs    09/30/18 0054 09/30/18 0434 09/30/18 1027 09/30/18 1505 09/30/18 1829  HGB 14.7  --   --   --   --   HCT 44.3  --   --   --   --   PLT 194  --   --   --   --   HEPARINUNFRC  --   --  0.28*  --  0.40  CREATININE 1.14  --   --   --   --   TROPONINI  --  <0.03 <0.03 <0.03  --     Estimated Creatinine Clearance: 72.1 mL/min (by C-G formula based on SCr of 1.14 mg/dL).   Medical History: Past Medical History:  Diagnosis Date  . Allergic rhinitis 07/19/2016  . Asthma 07/19/2016  . Atherosclerotic heart disease of native coronary artery without angina pectoris 07/28/2015    CAD/ MI/ CABG  Overview:  [place].  MI LAD stent. 01/2017 Overview:  He had MI with staged PCI of LAD and later RCA and LCF 2015, EF 40% Overview:  Added automatically from request for surgery [id number]  . Cardiomyopathy (HCC) 07/19/2016   Overview:  EF 45% or 54% 01/2017  . Chest pain in adult 02/01/2017  . Chronic bronchitis (HCC)   . COPD (chronic obstructive pulmonary disease) (HCC) 04/26/2015   PFT-06/27/2015-minimal obstructive airways disease with insignificant response to bronchodilator, minimal diffusion defect, normal lung volumes. FEV1/FVC 0.76, DLCO 72%  . Coronary artery disease   . Dyspnea on exertion 04/26/2015  . Emphysema lung (HCC)    "treated for asthma but later found out it was emphysema" (10/15/2017)  . Essential hypertension 07/19/2016  . GERD (gastroesophageal reflux disease)   . High risk medication use 07/19/2016  . Hyperlipidemia    "left lung"  .  Hypertension   . Inguinal hernia 07/19/2016   Overview:  Mild bilateral.  . Lung nodule seen on imaging study 07/28/2015   chest x-ray report from 03/05/2014 at [facility] described a vague nodular density in the left upper lobe and recommended follow-up CT scan which was never done.   . LV dysfunction 05/30/2017  . MI (myocardial infarction) (HCC) 12/2013; 01/2017  . Mixed hyperlipidemia 07/19/2016  . 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 Prediabetes 07/19/2016  . Pulmonary embolism (HCC) 10/15/2017  . Stage 3 chronic kidney disease (HCC) 07/19/2016  . STEMI (ST elevation myocardial infarction) [facility]) 10/18/2017   Feb 2015    Medications:  No current facility-administered medications on file prior to encounter.    Current Outpatient Medications on File Prior to Encounter  Medication Sig Dispense Refill  . albuterol (PROVENTIL HFA;VENTOLIN HFA) 108 (90 Base) MCG/ACT inhaler Inhale 1 puff into the lungs every 6 (six) hours as needed for wheezing or shortness of breath.    Marland Kitchen atorvastatin (LIPITOR) 80 MG tablet Take 80 mg by mouth every evening.   2  . carvedilol (COREG) 12.5 MG tablet TAKE 1 AND 1/2 tablets BY MOUTH TWICE (2) DAILY (Patient taking differently: Take 18.75 mg by mouth 2 (two) times daily with a meal. )  270 tablet 2  . clopidogrel (PLAVIX) 75 MG tablet TAKE 1 TABLET BY MOUTH ONCE DAILY (Patient taking differently: Take 75 mg by mouth every evening. ) 90 tablet 2  . Cyanocobalamin (B-12 PO) Take 1 tablet by mouth daily.     . mometasone-formoterol (DULERA) 200-5 MCG/ACT AERO Inhale 2 puffs into the lungs every evening.    . montelukast (SINGULAIR) 10 MG tablet Take 10 mg by mouth daily.  2  . nitroGLYCERIN (NITROSTAT) 0.4 MG SL tablet Place 0.4 mg under the tongue every 5 (five) minutes as needed for chest pain.    Marland Kitchen omeprazole (PRILOSEC) 40 MG capsule Take 40 mg by mouth daily.    . [DISCONTINUED] aspirin EC 81 MG tablet Take 1 tablet (81 mg total) by mouth daily. 30  tablet 0  . [DISCONTINUED] sacubitril-valsartan (ENTRESTO) 97-103 MG Take 1 tablet by mouth 2 (two) times daily. 60 tablet 1  . [DISCONTINUED] Mometasone Furo-Formoterol Fum (DULERA IN) Inhale 2 puffs into the lungs at bedtime.        Assessment: 66 y.o. male with h/o PE, now with new B PE, for heparin.   Heparin level came back therapeutic at 0.4, on 1700 units/hr. CBC stable this morning. No infusion issues or s/sx of bleeding.   Goal of Therapy:  Heparin level 0.3-0.7 units/ml Monitor platelets by anticoagulation protocol: Yes   Plan:  Continue IV heparin at 1700 units/hr. Daily heparin level and CBC. F/u plans to change to oral anticoagulation.  Girard Cooter, PharmD Clinical Pharmacist  Pager: [phone] Phone: [phone]  09/30/2018 7:37 PM

## 2018-09-30 NOTE — Progress Notes (Signed)
Patient arrived via stretcher with E.R. staff alert and orin. Orin. To room no complaints at this time. R.N. Into do assessment.

## 2018-09-30 NOTE — ED Triage Notes (Signed)
Pt her for chest pain and shortness of breath when lying down.  Hx of PE in the past.  States feels the same.  Lungs diminished in the bases.  On plavix and ASA. A&Ox4.

## 2018-10-01 DIAGNOSIS — I25119 Atherosclerotic heart disease of native coronary artery with unspecified angina pectoris: Secondary | ICD-10-CM | POA: Diagnosis not present

## 2018-10-01 DIAGNOSIS — I10 Essential (primary) hypertension: Secondary | ICD-10-CM | POA: Diagnosis not present

## 2018-10-01 DIAGNOSIS — I2699 Other pulmonary embolism without acute cor pulmonale: Secondary | ICD-10-CM

## 2018-10-01 LAB — CBC
HCT: 41 % (ref 39.0–52.0)
Hemoglobin: 13.6 g/dL (ref 13.0–17.0)
MCH: 30.2 pg (ref 26.0–34.0)
MCHC: 33.2 g/dL (ref 30.0–36.0)
MCV: 90.9 fL (ref 80.0–100.0)
PLATELETS: 181 10*3/uL (ref 150–400)
RBC: 4.51 MIL/uL (ref 4.22–5.81)
RDW: 13.9 % (ref 11.5–15.5)
WBC: 9.7 10*3/uL (ref 4.0–10.5)
nRBC: 0 % (ref 0.0–0.2)

## 2018-10-01 LAB — HEPARIN LEVEL (UNFRACTIONATED): HEPARIN UNFRACTIONATED: 0.32 [IU]/mL (ref 0.30–0.70)

## 2018-10-01 MED ORDER — APIXABAN 5 MG PO TABS
10.0000 mg | ORAL_TABLET | Freq: Two times a day (BID) | ORAL | Status: DC
  Administered 2018-10-01 – 2018-10-02 (×3): 10 mg via ORAL
  Filled 2018-10-01 (×3): qty 2

## 2018-10-01 MED ORDER — APIXABAN 5 MG PO TABS: 5.0000 mg | ORAL_TABLET | Freq: Two times a day (BID) | ORAL | Status: DC

## 2018-10-01 NOTE — Discharge Instructions (Addendum)
Information on my medicine - ELIQUIS (apixaban)  This medication education was reviewed with me or my healthcare representative as part of my discharge preparation.  The pharmacist that spoke with me during my hospital stay was:  [facility], Maryanna Shape, [facility]  Why was Eliquis prescribed for you? Eliquis was prescribed to treat blood clots that may have been found in the veins of your legs (deep vein thrombosis) or in your lungs (pulmonary embolism) and to reduce the risk of them occurring again.  What do You need to know about Eliquis ? The starting dose is 10 mg (two 5 mg tablets) taken TWICE daily for the FIRST SEVEN (7) DAYS, then on  10/08/18  the dose is reduced to ONE 5 mg tablet taken TWICE daily.  Eliquis may be taken with or without food.   Try to take the dose about the same time in the morning and in the evening. If you have difficulty swallowing the tablet whole please discuss with your pharmacist how to take the medication safely.  Take Eliquis exactly as prescribed and DO NOT stop taking Eliquis without talking to the doctor who prescribed the medication.  Stopping may increase your risk of developing a new blood clot.  Refill your prescription before you run out.  After discharge, you should have regular check-up appointments with your healthcare provider that is prescribing your Eliquis.    What do you do if you miss a dose? If a dose of ELIQUIS is not taken at the scheduled time, take it as soon as possible on the same day and twice-daily administration should be resumed. The dose should not be doubled to make up for a missed dose.  Important Safety Information A possible side effect of Eliquis is bleeding. You should call your healthcare provider right away if you experience any of the following: ? Bleeding from an injury or your nose that does not stop. ? Unusual colored urine (red or dark brown) or unusual colored stools (red or black). ? Unusual bruising for  unknown reasons. ? A serious fall or if you hit your head (even if there is no bleeding).  Some medicines may interact with Eliquis and might increase your risk of bleeding or clotting while on Eliquis. To help avoid this, consult your healthcare provider or pharmacist prior to using any new prescription or non-prescription medications, including herbals, vitamins, non-steroidal anti-inflammatory drugs (NSAIDs) and supplements.  This website has more information on Eliquis (apixaban): http://www.eliquis.com/eliquis/home    Pulmonary Embolism A pulmonary embolism (PE) is a sudden blockage or decrease of blood flow in one lung or both lungs. Most blockages come from a blood clot that forms in a lower leg, thigh, or arm vein (deep vein thrombosis, DVT) and travels to the lungs. A clot is blood that has thickened into a gel or solid. PE is a dangerous and life-threatening condition that needs to be treated right away. What are the causes? This condition is usually caused by a blood clot that forms in a vein and moves to the lungs. In rare cases, it may be caused by air, fat, part of a tumor, or other tissue that moves through the veins and into the lungs. What increases the risk? The following factors may make you more likely to develop this condition:  Having DVT or a history of DVT.  Being older than age 2.  Personal or family history of blood clots or blood clotting disease.  Major or lengthy surgery.  Orthopedic surgery, especially hip  or knee replacement.  Traumatic injury, such as breaking a hip or leg.  Spinal cord injury.  Stroke.  Taking medicines that contain estrogen. These include birth control pills and hormone replacement therapy.  Long-term (chronic) lung or heart disease.  Cancer and chemotherapy.  Having a central venous catheter.  Pregnancy and the period after delivery.  What are the signs or symptoms? Symptoms of this condition usually start suddenly and  include:  Shortness of breath while active or at rest.  Coughing or coughing up blood or blood-tinged mucus.  Chest pain that is often worse with deep breaths.  Rapid or irregular heartbeat.  Feeling light-headed or dizzy.  Fainting.  Feeling anxious.  Sweating.  Pain and swelling in a leg. This is a symptom of DVT, which can lead to PE.  How is this diagnosed? This condition may be diagnosed based on:  Your medical history.  A physical exam.  Blood tests to check blood oxygen level and how well your blood clots, and a D-dimer blood test, which checks your blood for a substance that is released when a blood clot breaks apart.  CT pulmonary angiogram. This test checks blood flow in and around your lungs.  Ventilation-perfusion scan, also called a lung VQ scan. This test measures air flow and blood flow to the lungs.  Ultrasound of the legs to look for blood clots.  How is this treated? Treatment for this conditions depends on many factors, such as the cause of your PE, your risk for bleeding or developing more clots, and other medical conditions you have. Treatment aims to remove, dissolve, or stop blood clots from forming or growing larger. Treatment may include:  Blood thinning medicines (anticoagulants) to stop clots from forming or growing. These medicines may be given as a pill, as an injection, or through an IV tube (infusion).  Medicines that dissolve clots (thrombolytics).  A procedure in which a flexible tube is used to remove a blood clot (embolectomy) or deliver medicine to destroy it (catheter-directed thrombolysis).  A procedure in which a filter is inserted into a large vein that carries blood to the heart (inferior vena cava). This filter (vena cava filter) catches blood clots before they reach the lungs.  Surgery to remove the clot (surgical embolectomy). This is rare.  You may need a combination of immediate, long-term (up to 3 months after diagnosis),  and extended (more than 3 months after diagnosis) treatments. Your treatment may continue for several months (maintenance therapy). You and your health care provider will work together to choose the treatment program that is best for you. Follow these instructions at home: If you are taking an anticoagulant medicine:  Take the medicine every day at the same time each day.  Understand what foods and drugs interact with your medicine.  Understand the side effects of this medicine, including excessive bruising or bleeding. Ask your health care provider or pharmacist about other side effects. General instructions  Take over-the-counter and prescription medicines only as told by your health care provider.  Anticoagulant medicines may cause side effects, including easy bruising and difficulty stopping bleeding. If you are prescribed an anticoagulant: ? Hold pressure over cuts for longer than usual. ? Tell your dentist and other health care providers that you are taking anticoagulants before you have any procedure that may cause bleeding. ? Avoid contact sports. ? Be extra careful when handling sharp objects. ? Use a soft toothbrush. Floss with waxed dental floss. ? Shave with an Neurosurgeon.  Wear a medical alert bracelet or carry a medical alert card that says you have had a PE.  Ask your health care provider when you may return to your normal activities.  Talk with your health care provider about any travel plans. It is important to make sure that you are still able to take your medicine while on trips.  Keep all follow-up visits as told by your health care provider. This is important. How is this prevented? Take these actions to lower your risk of developing another PE:  Exercise regularly. Take frequent walks. For at least 30 minutes every day, engage in: ? Activity that involves moving your arms and legs. ? Activity that encourages good blood flow through your body by increasing  your heart rate.  While traveling, drink plenty of water and avoid drinking alcohol. Ask your health care provider if you should wear below-the-knee compression stockings.  Avoid sitting or lying in bed for long periods of time without moving your legs. Exercise your arms and legs every hour during long-distance travel (over 4 hours).  If you are hospitalized or have surgery, ask your health care provider about your risks and what treatments can help prevent blood clots.  Maintain a healthy weight. Ask your health care provider what weight is healthy for you.  If you are a woman who is over age 5, avoid unnecessary use of medicines that contain estrogen, including birth control pills.  Do not use any products that contain nicotine or tobacco, such as cigarettes and e-cigarettes. This is especially important if you take estrogen medicines. If you need help quitting, ask your health care provider.  See your health care provider for regular checkups. This may include blood tests and ultrasound testing on your legs to check for new blood clots.  Contact a health care provider if:  You missed a dose of your blood thinner medicine. Get help right away if:  You have new or increased pain, swelling, warmth, or redness in an arm or leg.  You have numbness or tingling in an arm or leg.  You have shortness of breath while active or at rest.  You have chest pain.  You have a rapid or irregular heartbeat.  You feel light-headed or dizzy.  You cough up blood.  You have blood in your vomit, stool, or urine.  You have a fever.  You have abdomen (abdominal) pain.  You have a severe fall or head injury.  You have a severe headache.  You have vision changes.  You cannot move your arms or legs.  You are confused or have memory loss.  You are bleeding for 10 minutes or more, even with strong pressure on the wound. These symptoms may represent a serious problem that is an emergency. Do  not wait to see if the symptoms will go away. Get medical help right away. Call your local emergency services (911 in the [place] Do not drive yourself to the hospital. Summary  A pulmonary embolism (PE) is a sudden blockage or decrease of blood flow in one lung or both lungs. PE is a dangerous and life-threatening condition that needs to be treated right away.  Having deep vein thrombosis (DVT) or a history of DVT is the most common risk factor for PE.  Treatments for this condition usually include medicines to thin your blood (anticoagulants) or medicines to break apart blood clots (thrombolytics).  If you are prescribed blood thinners, it is important to take the medicine every single day  at the same time each day.  If you have signs of PE or DVT, call your local emergency services (911 in the [place] This information is not intended to replace advice given to you by your health care provider. Make sure you discuss any questions you have with your health care provider. Document Released: 11/02/2000 Document Revised: 12/08/2016 Document Reviewed: 12/08/2016 Elsevier Interactive Patient Education  2018 [employer].

## 2018-10-01 NOTE — Progress Notes (Addendum)
[facility] PROGRESS NOTE  Derek Hart ZOX:[id number] DOB: Mar 22, 1951 DOA: 09/30/2018  PCP: Gordan Payment., MD  Brief History/Interval Summary: 67 year old Caucasian male with a past medical history of coronary artery disease, last stent placement in March 2018 to LAD.  This was a drug-eluting stent.  He also has a history of PE but no longer on anticoagulation.  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  Patient presented with chest pain.  Evaluation revealed acute pulmonary embolism.  Patient was hospitalized for further management.  Reason for Visit: Acute pulmonary embolism  Consultants: None  Procedures:   Transthoracic echocardiogram Study Conclusions  - Left ventricle: The cavity size was normal. Systolic function was   normal. The estimated ejection fraction was in the range of 50%   to 55%. Akinesis and scarring of the apical myocardium;   consistent with infarction in the distribution of the left   anterior descending coronary artery. Doppler parameters are   consistent with abnormal left ventricular relaxation (grade 1   diastolic dysfunction). Acoustic contrast opacification revealed   no evidence ofthrombus. - Left atrium: The atrium was mildly dilated.  Antibiotics: None  Subjective/Interval History: Patient complains of chest pain only when he takes a deep breath.  Denies any cough.  No dizziness lightheadedness.  No shortness of breath.  No nausea or vomiting  ROS: Denies any headaches  Objective:  Vital Signs  Vitals:   09/30/18 2025 10/01/18 0339 10/01/18 0345 10/01/18 1056  BP: 114/75 117/87  120/82  Pulse: 68 72  68  Resp: 17 14    Temp: 97.7 F (36.5 C) 97.8 F (36.6 C)    TempSrc: Oral Oral    SpO2: 95% 92%    Weight:   89.5 kg   Height:        Intake/Output Summary (Last 24 hours) at 10/01/2018 1109 Last data filed at 10/01/2018 8119 Gross per 24 hour  Intake 1196.02 ml  Output 850 ml  Net 346.02 ml   Filed Weights   09/30/18 0344 09/30/18 0523 10/01/18 0345  Weight: 88.5 kg 92 kg 89.5 kg    General appearance: alert, cooperative, appears stated age and no distress Head: Normocephalic, without obvious abnormality, atraumatic Resp: clear to auscultation bilaterally Cardio: regular rate and rhythm, S1, S2 normal, no murmur, click, rub or gallop GI: soft, non-tender; bowel sounds normal; no masses,  no organomegaly Extremities: extremities normal, atraumatic, no cyanosis or edema Pulses: 2+ and symmetric Neurologic: No obvious focal neurological deficits.  Lab Results:  Data Reviewed: I have personally reviewed following labs and imaging studies  CBC: Recent Labs  Lab 09/30/18 0054 10/01/18 0342  WBC 9.1 9.7  HGB 14.7 13.6  HCT 44.3 41.0  MCV 92.3 90.9  PLT 194 181    Basic Metabolic Panel: Recent Labs  Lab 09/30/18 0054  NA 139  K 4.1  CL 106  CO2 26  GLUCOSE 132*  BUN 18  CREATININE 1.14  CALCIUM 8.9    GFR: Estimated Creatinine Clearance: 72.1 mL/min (by C-G formula based on SCr of 1.14 mg/dL).  Cardiac Enzymes: Recent Labs  Lab 09/30/18 0434 09/30/18 1027 09/30/18 1505  TROPONINI <0.03 <0.03 <0.03     Radiology Studies: Dg Chest 2 View  Result Date: 09/30/2018 CLINICAL DATA:  Acute onset of right-sided chest pain. EXAM: CHEST - 2 VIEW COMPARISON:  Chest radiograph and CTA of the chest performed 10/15/2017 FINDINGS: The lungs are well-aerated. Mild bibasilar opacities likely reflect atelectasis. There is no evidence of pleural  effusion or pneumothorax. The heart is normal in size; the mediastinal contour is within normal limits. No acute osseous abnormalities are seen. IMPRESSION: Mild bibasilar opacities likely reflect atelectasis; lungs otherwise clear. Electronically Signed   By: Roanna Raider M.D.   On: 09/30/2018 01:24   Ct Angio Chest Pe W Or Wo Contrast  Result Date: 09/30/2018 CLINICAL DATA:  Acute onset of generalized chest pain and shortness of breath.  EXAM: CT ANGIOGRAPHY CHEST WITH CONTRAST TECHNIQUE: Multidetector CT imaging of the chest was performed using the standard protocol during bolus administration of intravenous contrast. Multiplanar CT image reconstructions and MIPs were obtained to evaluate the vascular anatomy. CONTRAST:  79 mL ISOVUE-370 IOPAMIDOL (ISOVUE-370) INJECTION 76% COMPARISON:  Chest radiograph performed earlier today at 12:57 a.m., and CTA of the chest performed 10/15/2017 FINDINGS: Cardiovascular: Pulmonary embolus is noted within segmental branches to the left lower lobe, and is suggested within the pulmonary artery branch to the right middle lobe. The RV/LV ratio of 1.65 raises concern for right heart strain and at least submassive pulmonary embolus. Diffuse coronary artery calcifications are seen. The heart is borderline normal in size. The thoracic aorta is grossly unremarkable. The great vessels are within normal limits. Mediastinum/Nodes: Scattered mediastinal nodes remain normal in size. No mediastinal lymphadenopathy is seen. No pericardial effusion is identified. The visualized portions of the thyroid gland are unremarkable. No axillary lymphadenopathy is seen. Lungs/Pleura: Patchy bibasilar airspace opacities likely reflect atelectasis. No significant pleural effusion or pneumothorax is seen. No masses are identified. Upper Abdomen: The visualized portions of the liver and spleen are unremarkable. Musculoskeletal: No acute osseous abnormalities are identified. The visualized musculature is unremarkable in appearance. Review of the MIP images confirms the above findings. IMPRESSION: 1. Pulmonary embolus within segmental branches to the left lower lobe, and suggested within the pulmonary artery branch to the right middle lobe. CT evidence of right heart strain (RV/LV Ratio = 1.65) consistent with at least submassive (intermediate risk) PE. The presence of right heart strain has been associated with an increased risk of morbidity  and mortality. Please activate Code PE by paging [phone]. 2. Patchy bibasilar airspace opacities likely reflect atelectasis. 3. Diffuse coronary artery calcifications seen. Critical Value/emergent results were called by telephone at the time of interpretation on 09/30/2018 at 3:10 am to Dr. Wilkie Aye, who verbally acknowledged these results. Electronically Signed   By: Roanna Raider M.D.   On: 09/30/2018 03:10   Vas [place] Lower Extremity Venous (dvt)  Result Date: 09/30/2018  Lower Venous Study Indications: Pulmonary embolism.  Performing Technologist: Blanch Media  Examination Guidelines: A complete evaluation includes B-mode imaging, spectral Doppler, color Doppler, and power Doppler as needed of all accessible portions of each vessel. Bilateral testing is considered an integral part of a complete examination. Limited examinations for reoccurring indications may be performed as noted.  Right Venous Findings: +---------+---------------+---------+-----------+----------+-------+          CompressibilityPhasicitySpontaneityPropertiesSummary +---------+---------------+---------+-----------+----------+-------+ CFV      Full           Yes      Yes                          +---------+---------------+---------+-----------+----------+-------+ SFJ      Full                                                 +---------+---------------+---------+-----------+----------+-------+  FV Prox  Full                                                 +---------+---------------+---------+-----------+----------+-------+ FV Mid   Full                                                 +---------+---------------+---------+-----------+----------+-------+ FV DistalFull                                                 +---------+---------------+---------+-----------+----------+-------+ PFV      Full                                                  +---------+---------------+---------+-----------+----------+-------+ POP      Full           Yes      Yes                          +---------+---------------+---------+-----------+----------+-------+ PTV      Full                                                 +---------+---------------+---------+-----------+----------+-------+ PERO     Full                                                 +---------+---------------+---------+-----------+----------+-------+  Left Venous Findings: +---------+---------------+---------+-----------+----------+-------+          CompressibilityPhasicitySpontaneityPropertiesSummary +---------+---------------+---------+-----------+----------+-------+ CFV      Full           Yes      Yes                          +---------+---------------+---------+-----------+----------+-------+ SFJ      Full                                                 +---------+---------------+---------+-----------+----------+-------+ FV Prox  Full                                                 +---------+---------------+---------+-----------+----------+-------+ FV Mid   Full                                                 +---------+---------------+---------+-----------+----------+-------+  FV DistalFull                                                 +---------+---------------+---------+-----------+----------+-------+ PFV      Full                                                 +---------+---------------+---------+-----------+----------+-------+ POP      Full           Yes      Yes                          +---------+---------------+---------+-----------+----------+-------+ PTV      Full                                                 +---------+---------------+---------+-----------+----------+-------+ PERO     Full                                                  +---------+---------------+---------+-----------+----------+-------+    Summary: Right: There is no evidence of deep vein thrombosis in the lower extremity. No cystic structure found in the popliteal fossa. Left: There is no evidence of deep vein thrombosis in the lower extremity. No cystic structure found in the popliteal fossa.  *See table(s) above for measurements and observations. Electronically signed by Waverly Ferrari MD on 09/30/2018 at 2:30:19 PM.    Final      Medications:  Scheduled: . aspirin EC  81 mg Oral Daily  . atorvastatin  80 mg Oral QPM  . carvedilol  18.75 mg Oral BID WC  . montelukast  10 mg Oral Daily  . pantoprazole  80 mg Oral Daily  . sacubitril-valsartan  1 tablet Oral BID   Continuous: . heparin 1,750 Units/hr (10/01/18 1100)   KGM:[id number] **OR** acetaminophen, albuterol, ondansetron **OR** ondansetron (ZOFRAN) IV, traMADol  Assessment/Plan:    Recurrent acute pulmonary embolism Patient hemodynamically stable.  Tolerating heparin well.  Echocardiogram does not show any evidence for heart strain.  No DVT noted on lower extremity Doppler study.  Discussed with his cardiologist Dr. Dulce Sellar who recommends switching the patient to Eliquis and stopping his Plavix.  He recommends continuing aspirin for now.  Case management to assist with medications.  History of coronary artery disease Last intervention was March 2018 when he had a drug-eluting stent placed in his LAD.  He has been on dual antiplatelet medication since then.  This has been discussed with his cardiology as mentioned above.  We will continue with aspirin but stop his Plavix.  Continue beta-blocker and statin.  Essential hypertension Stable.  Continue home medications.  History of ischemic cardiomyopathy Ejection fraction on current echocardiogram is 50 to 55%.  Wall motion abnormalities noted.  Continue with his home medications.  He is on carvedilol and Entresto.  DVT Prophylaxis: IV  heparin.  To be transitioned to Eliquis    Code Status: Full code Family  Communication: Discussed with the patient Disposition Plan: Management as outlined above    LOS: 0 days   Osvaldo Shipper  [facility] Pager [phone] 10/01/2018, 11:09 AM  If 7PM-7AM, please contact night-coverage at [email or web address], password [email or web address]

## 2018-10-01 NOTE — Progress Notes (Addendum)
ANTICOAGULATION CONSULT NOTE   Pharmacy Consult for Heparin Indication: pulmonary embolus  Allergies  Allergen Reactions  . Pneumovax 23 [Pneumococcal Vac Polyvalent] Rash    Patient Measurements: Height: 6\' 2"  (188 cm) Weight: 197 lb 6.4 oz (89.5 kg) IBW/kg (Calculated) : 82.2  Vital Signs: Temp: 97.8 F (36.6 C) (11/13 0339) Temp Source: Oral (11/13 0339) BP: 117/87 (11/13 0339) Pulse Rate: 72 (11/13 0339)  Labs: Recent Labs    09/30/18 0054 09/30/18 0434 09/30/18 1027 09/30/18 1505 09/30/18 1829 10/01/18 0342  HGB 14.7  --   --   --   --  13.6  HCT 44.3  --   --   --   --  41.0  PLT 194  --   --   --   --  181  HEPARINUNFRC  --   --  0.28*  --  0.40 0.32  CREATININE 1.14  --   --   --   --   --   TROPONINI  --  <0.03 <0.03 <0.03  --   --     Estimated Creatinine Clearance: 72.1 mL/min (by C-G formula based on SCr of 1.14 mg/dL).   Medical History: Past Medical History:  Diagnosis Date  . Allergic rhinitis 07/19/2016  . Asthma 07/19/2016  . Atherosclerotic heart disease of native coronary artery without angina pectoris 07/28/2015    CAD/ MI/ CABG  Overview:  [place].  MI LAD stent. 01/2017 Overview:  He had MI with staged PCI of LAD and later RCA and LCF 2015, EF 40% Overview:  Added automatically from request for surgery [id number]  . Cardiomyopathy (HCC) 07/19/2016   Overview:  EF 45% or 54% 01/2017  . Chest pain in adult 02/01/2017  . Chronic bronchitis (HCC)   . COPD (chronic obstructive pulmonary disease) (HCC) 04/26/2015   PFT-06/27/2015-minimal obstructive airways disease with insignificant response to bronchodilator, minimal diffusion defect, normal lung volumes. FEV1/FVC 0.76, DLCO 72%  . Coronary artery disease   . Dyspnea on exertion 04/26/2015  . Emphysema lung (HCC)    "treated for asthma but later found out it was emphysema" (10/15/2017)  . Essential hypertension 07/19/2016  . GERD (gastroesophageal reflux disease)   . High risk medication use 07/19/2016  .  Hyperlipidemia    "left lung"  . Hypertension   . Inguinal hernia 07/19/2016   Overview:  Mild bilateral.  . Lung nodule seen on imaging study 07/28/2015   chest x-ray report from 03/05/2014 at [facility] described a vague nodular density in the left upper lobe and recommended follow-up CT scan which was never done.   . LV dysfunction 05/30/2017  . MI (myocardial infarction) (HCC) 12/2013; 01/2017  . Mixed hyperlipidemia 07/19/2016  . 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 Prediabetes 07/19/2016  . Pulmonary embolism (HCC) 10/15/2017  . Stage 3 chronic kidney disease (HCC) 07/19/2016  . STEMI (ST elevation myocardial infarction) [facility]) 10/18/2017   Feb 2015    Medications:  No current facility-administered medications on file prior to encounter.    Current Outpatient Medications on File Prior to Encounter  Medication Sig Dispense Refill  . albuterol (PROVENTIL HFA;VENTOLIN HFA) 108 (90 Base) MCG/ACT inhaler Inhale 1 puff into the lungs every 6 (six) hours as needed for wheezing or shortness of breath.    Marland Kitchen atorvastatin (LIPITOR) 80 MG tablet Take 80 mg by mouth every evening.   2  . carvedilol (COREG) 12.5 MG tablet TAKE 1 AND 1/2 tablets BY MOUTH TWICE (2) DAILY (Patient taking differently: Take  18.75 mg by mouth 2 (two) times daily with a meal. ) 270 tablet 2  . clopidogrel (PLAVIX) 75 MG tablet TAKE 1 TABLET BY MOUTH ONCE DAILY (Patient taking differently: Take 75 mg by mouth every evening. ) 90 tablet 2  . Cyanocobalamin (B-12 PO) Take 1 tablet by mouth daily.     . mometasone-formoterol (DULERA) 200-5 MCG/ACT AERO Inhale 2 puffs into the lungs every evening.    . montelukast (SINGULAIR) 10 MG tablet Take 10 mg by mouth daily.  2  . nitroGLYCERIN (NITROSTAT) 0.4 MG SL tablet Place 0.4 mg under the tongue every 5 (five) minutes as needed for chest pain.    Marland Kitchen omeprazole (PRILOSEC) 40 MG capsule Take 40 mg by mouth daily.       Assessment: 67 y.o. male on heparin gtt for PE  right now, planning life-long Xarelto, checking dopplers. Last heparin level was therapeutic at 0.32. CBC stable.  Goal of Therapy:  Heparin level 0.3-0.7 units/ml Monitor platelets by anticoagulation protocol: Yes   Plan:  Increase heparin gtt slightly to 1,750 units/hr. Monitor daily heparin level, CBC, s/s of bleed Change to Xarelto soon?  Enzo Bi, PharmD, BCPS Clinical Pharmacist Phone number [phone] 10/01/2018 8:43 AM    Addendum: Pharmacy consulted to transition to apixaban.  Discontinue heparin drip Apixaban 10 mg PO bid for 7 days then 5 mg PO bid Needs education prior to discharge  Loura Back, PharmD, BCPS Clinical Pharmacist Clinical phone for 10/01/2018 until 3p is [phone] 10/01/2018 12:33 PM  **Pharmacist phone directory can now be found on [email or web address] listed under [facility]**

## 2018-10-02 DIAGNOSIS — I10 Essential (primary) hypertension: Secondary | ICD-10-CM | POA: Diagnosis not present

## 2018-10-02 DIAGNOSIS — I25119 Atherosclerotic heart disease of native coronary artery with unspecified angina pectoris: Secondary | ICD-10-CM | POA: Diagnosis not present

## 2018-10-02 DIAGNOSIS — I2699 Other pulmonary embolism without acute cor pulmonale: Secondary | ICD-10-CM | POA: Diagnosis not present

## 2018-10-02 LAB — CBC
HEMATOCRIT: 41.4 % (ref 39.0–52.0)
Hemoglobin: 13.9 g/dL (ref 13.0–17.0)
MCH: 30.3 pg (ref 26.0–34.0)
MCHC: 33.6 g/dL (ref 30.0–36.0)
MCV: 90.2 fL (ref 80.0–100.0)
NRBC: 0 % (ref 0.0–0.2)
Platelets: 190 10*3/uL (ref 150–400)
RBC: 4.59 MIL/uL (ref 4.22–5.81)
RDW: 13.8 % (ref 11.5–15.5)
WBC: 9.7 10*3/uL (ref 4.0–10.5)

## 2018-10-02 LAB — BASIC METABOLIC PANEL
ANION GAP: 8 (ref 5–15)
BUN: 13 mg/dL (ref 8–23)
CO2: 23 mmol/L (ref 22–32)
Calcium: 8.8 mg/dL — ABNORMAL LOW (ref 8.9–10.3)
Chloride: 104 mmol/L (ref 98–111)
Creatinine, Ser: 0.96 mg/dL (ref 0.61–1.24)
GFR calc non Af Amer: >60 mL/min (ref >60)
GLUCOSE: 112 mg/dL — AB (ref 70–99)
POTASSIUM: 4 mmol/L (ref 3.5–5.1)
Sodium: 135 mmol/L (ref 135–145)

## 2018-10-02 LAB — HEPARIN LEVEL (UNFRACTIONATED): Heparin Unfractionated: >2.2 IU/mL — ABNORMAL HIGH (ref 0.30–0.70)

## 2018-10-02 MED ORDER — APIXABAN 5 MG PO TABS: 10.0000 mg | ORAL_TABLET | Freq: Two times a day (BID) | ORAL | 0 refills | Status: DC

## 2018-10-02 MED ORDER — APIXABAN 5 MG PO TABS: 5.0000 mg | ORAL_TABLET | Freq: Two times a day (BID) | ORAL | 2 refills | Status: DC

## 2018-10-02 MED ORDER — ELIQUIS 5 MG VTE STARTER PACK: ORAL_TABLET | ORAL | 0 refills | Status: DC

## 2018-10-02 MED FILL — ELIQUIS STARTER PACK 5 MG T: 5 | 30 days supply | Qty: 74 | Fill #0

## 2018-10-02 MED FILL — ELIQUIS 5 MG TABLET: 5 | 30 days supply | Qty: 60 | Fill #0 | Status: TO

## 2018-10-02 NOTE — Discharge Summary (Signed)
[facility]  Physician Discharge Summary   Patient ID: Derek Hart MRN: [id number] DOB/AGE: 1951-01-05 67 y.o.  Admit date: 09/30/2018 Discharge date: 10/02/2018  PCP: Gordan Payment., MD  DISCHARGE DIAGNOSES:  Acute pulmonary embolism, recurrent Coronary artery disease  RECOMMENDATIONS FOR OUTPATIENT FOLLOW UP: 1. Outpatient follow-up with PCP and cardiology   DISCHARGE CONDITION: fair  Diet recommendation: Heart healthy  Filed Weights   09/30/18 0523 10/01/18 0345 10/02/18 0513  Weight: 92 kg 89.5 kg 88.5 kg    INITIAL HISTORY: 66 year old Caucasian male with a past medical history of coronary artery disease, last stent placement in March 2018 to LAD.  This was a drug-eluting stent.  He also has a history of PE but no longer on anticoagulation.  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  Patient presented with chest pain.  Evaluation revealed acute pulmonary embolism.  Patient was hospitalized for further management.  Procedures:   Transthoracic echocardiogram Study Conclusions  - Left ventricle: The cavity size was normal. Systolic function was normal. The estimated ejection fraction was in the range of 50% to 55%. Akinesis and scarring of the apical myocardium; consistent with infarction in the distribution of the left anterior descending coronary artery. Doppler parameters are consistent with abnormal left ventricular relaxation (grade 1 diastolic dysfunction). Acoustic contrast opacification revealed no evidence ofthrombus. - Left atrium: The atrium was mildly dilated.   HOSPITAL COURSE:   Recurrent acute pulmonary embolism Patient was hemodynamically stable.  He was started on intravenous heparin.  Echocardiogram did not show any evidence for heart strain.  No DVT noted on lower extremity Doppler study.  Patient was transitioned to Eliquis.  Discussed with his cardiologist Dr. Dulce Sellar who recommends stopping his Plavix.  He  recommends continuing aspirin for now.   History of coronary artery disease Last intervention was March 2018 when he had a drug-eluting stent placed in his LAD.  He has been on dual antiplatelet medication since then.  This has been discussed with his cardiology as mentioned above.  We will continue with aspirin but stop his Plavix.  Continue beta-blocker and statin.  Essential hypertension Continue home medications.  History of ischemic cardiomyopathy Ejection fraction on current echocardiogram is 50 to 55%.  Wall motion abnormalities noted.  Continue with his home medications.  He is on carvedilol and Entresto.  Overall stable.  Okay for discharge home today.   PERTINENT LABS:  The results of significant diagnostics from this hospitalization (including imaging, microbiology, ancillary and laboratory) are listed below for reference.     Labs: Basic Metabolic Panel: Recent Labs  Lab 09/30/18 0054 10/02/18 0429  NA 139 135  K 4.1 4.0  CL 106 104  CO2 26 23  GLUCOSE 132* 112*  BUN 18 13  CREATININE 1.14 0.96  CALCIUM 8.9 8.8*   CBC: Recent Labs  Lab 09/30/18 0054 10/01/18 0342 10/02/18 0429  WBC 9.1 9.7 9.7  HGB 14.7 13.6 13.9  HCT 44.3 41.0 41.4  MCV 92.3 90.9 90.2  PLT 194 181 190   Cardiac Enzymes: Recent Labs  Lab 09/30/18 0434 09/30/18 1027 09/30/18 1505  TROPONINI <0.03 <0.03 <0.03   BNP: BNP (last 3 results) Recent Labs    12/05/17 1004  BNP 58.3     IMAGING STUDIES Dg Chest 2 View  Result Date: 09/30/2018 CLINICAL DATA:  Acute onset of right-sided chest pain. EXAM: CHEST - 2 VIEW COMPARISON:  Chest radiograph and CTA of the chest performed 10/15/2017 FINDINGS: The lungs are well-aerated. Mild bibasilar opacities  likely reflect atelectasis. There is no evidence of pleural effusion or pneumothorax. The heart is normal in size; the mediastinal contour is within normal limits. No acute osseous abnormalities are seen. IMPRESSION: Mild bibasilar  opacities likely reflect atelectasis; lungs otherwise clear. Electronically Signed   By: Roanna Raider M.D.   On: 09/30/2018 01:24   Ct Angio Chest Pe W Or Wo Contrast  Result Date: 09/30/2018 CLINICAL DATA:  Acute onset of generalized chest pain and shortness of breath. EXAM: CT ANGIOGRAPHY CHEST WITH CONTRAST TECHNIQUE: Multidetector CT imaging of the chest was performed using the standard protocol during bolus administration of intravenous contrast. Multiplanar CT image reconstructions and MIPs were obtained to evaluate the vascular anatomy. CONTRAST:  79 mL ISOVUE-370 IOPAMIDOL (ISOVUE-370) INJECTION 76% COMPARISON:  Chest radiograph performed earlier today at 12:57 a.m., and CTA of the chest performed 10/15/2017 FINDINGS: Cardiovascular: Pulmonary embolus is noted within segmental branches to the left lower lobe, and is suggested within the pulmonary artery branch to the right middle lobe. The RV/LV ratio of 1.65 raises concern for right heart strain and at least submassive pulmonary embolus. Diffuse coronary artery calcifications are seen. The heart is borderline normal in size. The thoracic aorta is grossly unremarkable. The great vessels are within normal limits. Mediastinum/Nodes: Scattered mediastinal nodes remain normal in size. No mediastinal lymphadenopathy is seen. No pericardial effusion is identified. The visualized portions of the thyroid gland are unremarkable. No axillary lymphadenopathy is seen. Lungs/Pleura: Patchy bibasilar airspace opacities likely reflect atelectasis. No significant pleural effusion or pneumothorax is seen. No masses are identified. Upper Abdomen: The visualized portions of the liver and spleen are unremarkable. Musculoskeletal: No acute osseous abnormalities are identified. The visualized musculature is unremarkable in appearance. Review of the MIP images confirms the above findings. IMPRESSION: 1. Pulmonary embolus within segmental branches to the left lower lobe, and  suggested within the pulmonary artery branch to the right middle lobe. CT evidence of right heart strain (RV/LV Ratio = 1.65) consistent with at least submassive (intermediate risk) PE. The presence of right heart strain has been associated with an increased risk of morbidity and mortality. Please activate Code PE by paging [phone]. 2. Patchy bibasilar airspace opacities likely reflect atelectasis. 3. Diffuse coronary artery calcifications seen. Critical Value/emergent results were called by telephone at the time of interpretation on 09/30/2018 at 3:10 am to Dr. Wilkie Aye, who verbally acknowledged these results. Electronically Signed   By: Roanna Raider M.D.   On: 09/30/2018 03:10   Vas [place] Lower Extremity Venous (dvt)  Result Date: 09/30/2018  Lower Venous Study Indications: Pulmonary embolism.  Performing Technologist: Blanch Media  Examination Guidelines: A complete evaluation includes B-mode imaging, spectral Doppler, color Doppler, and power Doppler as needed of all accessible portions of each vessel. Bilateral testing is considered an integral part of a complete examination. Limited examinations for reoccurring indications may be performed as noted.  Right Venous Findings: +---------+---------------+---------+-----------+----------+-------+          CompressibilityPhasicitySpontaneityPropertiesSummary +---------+---------------+---------+-----------+----------+-------+ CFV      Full           Yes      Yes                          +---------+---------------+---------+-----------+----------+-------+ SFJ      Full                                                 +---------+---------------+---------+-----------+----------+-------+  FV Prox  Full                                                 +---------+---------------+---------+-----------+----------+-------+ FV Mid   Full                                                  +---------+---------------+---------+-----------+----------+-------+ FV DistalFull                                                 +---------+---------------+---------+-----------+----------+-------+ PFV      Full                                                 +---------+---------------+---------+-----------+----------+-------+ POP      Full           Yes      Yes                          +---------+---------------+---------+-----------+----------+-------+ PTV      Full                                                 +---------+---------------+---------+-----------+----------+-------+ PERO     Full                                                 +---------+---------------+---------+-----------+----------+-------+  Left Venous Findings: +---------+---------------+---------+-----------+----------+-------+          CompressibilityPhasicitySpontaneityPropertiesSummary +---------+---------------+---------+-----------+----------+-------+ CFV      Full           Yes      Yes                          +---------+---------------+---------+-----------+----------+-------+ SFJ      Full                                                 +---------+---------------+---------+-----------+----------+-------+ FV Prox  Full                                                 +---------+---------------+---------+-----------+----------+-------+ FV Mid   Full                                                 +---------+---------------+---------+-----------+----------+-------+  FV DistalFull                                                 +---------+---------------+---------+-----------+----------+-------+ PFV      Full                                                 +---------+---------------+---------+-----------+----------+-------+ POP      Full           Yes      Yes                           +---------+---------------+---------+-----------+----------+-------+ PTV      Full                                                 +---------+---------------+---------+-----------+----------+-------+ PERO     Full                                                 +---------+---------------+---------+-----------+----------+-------+    Summary: Right: There is no evidence of deep vein thrombosis in the lower extremity. No cystic structure found in the popliteal fossa. Left: There is no evidence of deep vein thrombosis in the lower extremity. No cystic structure found in the popliteal fossa.  *See table(s) above for measurements and observations. Electronically signed by Waverly Ferrari MD on 09/30/2018 at 2:30:19 PM.    Final     DISCHARGE EXAMINATION: Vitals:   10/01/18 1930 10/02/18 0425 10/02/18 0513 10/02/18 0819  BP: 117/80   122/81  Pulse: 65 78  (!) 59  Resp: 18 18  16   Temp: 97.9 F (36.6 C) 98.7 F (37.1 C)    TempSrc: Oral Oral    SpO2: 92% 92%  92%  Weight:   88.5 kg   Height:       General appearance: alert, cooperative and no distress Resp: clear to auscultation bilaterally Cardio: regular rate and rhythm, S1, S2 normal, no murmur, click, rub or gallop GI: soft, non-tender; bowel sounds normal; no masses,  no organomegaly Extremities: extremities normal, atraumatic, no cyanosis or edema   DISPOSITION: Home  Discharge Instructions    (HEART FAILURE PATIENTS) Call MD:  Anytime you have any of the following symptoms: 1) 3 pound weight gain in 24 hours or 5 pounds in 1 week 2) shortness of breath, with or without a dry hacking cough 3) swelling in the hands, feet or stomach 4) if you have to sleep on extra pillows at night in order to breathe.   Complete by:  As directed    Call MD for:  difficulty breathing, headache or visual disturbances   Complete by:  As directed    Call MD for:  extreme fatigue   Complete by:  As directed    Call MD for:  persistant  dizziness or light-headedness   Complete by:  As directed    Call MD for:  persistant  nausea and vomiting   Complete by:  As directed    Call MD for:  severe uncontrolled pain   Complete by:  As directed    Call MD for:  temperature >100.4   Complete by:  As directed    Diet - low sodium heart healthy   Complete by:  As directed    Discharge instructions   Complete by:  As directed    Please take your medications as prescribed.  Please follow-up with either your cardiologist or your primary care provider within 1 week.  You were cared for by a hospitalist during your hospital stay. If you have any questions about your discharge medications or the care you received while you were in the hospital after you are discharged, you can call the unit and asked to speak with the hospitalist on call if the hospitalist that took care of you is not available. Once you are discharged, your primary care physician will handle any further medical issues. Please note that NO REFILLS for any discharge medications will be authorized once you are discharged, as it is imperative that you return to your primary care physician (or establish a relationship with a primary care physician if you do not have one) for your aftercare needs so that they can reassess your need for medications and monitor your lab values. If you do not have a primary care physician, you can call [phone] for a physician referral.   Increase activity slowly   Complete by:  As directed         Allergies as of 10/02/2018      Reactions   Pneumovax 23 [pneumococcal Vac Polyvalent] Rash      Medication List    STOP taking these medications   clopidogrel 75 MG tablet Commonly known as:  PLAVIX     TAKE these medications   albuterol 108 (90 Base) MCG/ACT inhaler Commonly known as:  PROVENTIL HFA;VENTOLIN HFA Inhale 1 puff into the lungs every 6 (six) hours as needed for wheezing or shortness of breath.   aspirin EC 81 MG tablet Take  1 tablet (81 mg total) by mouth daily.   atorvastatin 80 MG tablet Commonly known as:  LIPITOR Take 80 mg by mouth every evening.   B-12 PO Take 1 tablet by mouth daily.   carvedilol 12.5 MG tablet Commonly known as:  COREG TAKE 1 AND 1/2 tablets BY MOUTH TWICE (2) DAILY What changed:  See the new instructions.   DULERA 200-5 MCG/ACT Aero Generic drug:  mometasone-formoterol Inhale 2 puffs into the lungs every evening.   ELIQUIS STARTER PACK 5 MG Tabs Take as directed on package: start with two-5mg  tablets twice daily for 7 days. On day 8, switch to one-5mg  tablet twice daily.   montelukast 10 MG tablet Commonly known as:  SINGULAIR Take 10 mg by mouth daily.   nitroGLYCERIN 0.4 MG SL tablet Commonly known as:  NITROSTAT Place 0.4 mg under the tongue every 5 (five) minutes as needed for chest pain.   omeprazole 40 MG capsule Commonly known as:  PRILOSEC Take 40 mg by mouth daily.   sacubitril-valsartan 97-103 MG Commonly known as:  ENTRESTO Take 1 tablet by mouth 2 (two) times daily.        Follow-up Information    Gordan Payment., MD. Schedule an appointment as soon as possible for a visit in 1 week(s).   Specialty:  Internal Medicine Contact information: [address] [place] [address] [phone]  Baldo Daub, MD. Schedule an appointment as soon as possible for a visit.   Specialty:  Cardiology Why:  See within 1 to 2 weeks Contact information: [address] [place] [address] [phone]           TOTAL DISCHARGE TIME: 35 mins  Osvaldo Shipper  [facility] Pager [phone]  10/02/2018, 4:35 PM

## 2018-10-02 NOTE — Care Management Note (Signed)
Case Management Note Donn Pierini RN, BSN Transitions of Care Unit 4E- RN Case Manager [phone]  Patient Details  Name: Derek Hart MRN: [id number] Date of Birth: 05-16-1951  Subjective/Objective:  Pt admitted with bil PE                  Action/Plan: PTA pt lived at home with spouse- independent, plan to return home with spouse- referral for [employer] benefit check- per check both copay cost is $61- spoke with pt at bedside- per MD plan is for Eliquis (pt has been on Xarelto in past)- pt reports that he has several high copay drugs that he often ends up in the donut hole- pt is concerned about cost for medications- he has another insurance that he wants to see if he can get his meds through. Also discussed possible assistance with drug companies but pt reports he fears his income is too high. Offered [facility] pharmacy - pt states he does want to use them for is Eliquis needs.   Expected Discharge Date:  10/02/18               Expected Discharge Plan:  Home/Self Care  In-House Referral:  NA  Discharge planning Services  CM Consult, Medication Assistance  Post Acute Care Choice:  NA Choice offered to:  NA  DME Arranged:    DME Agency:     HH Arranged:    HH Agency:     Status of Service:  Completed, signed off  If discussed at [employer] of Stay Meetings, dates discussed:    Discharge Disposition: home/self care   Additional Comments:  10/02/18- 1230 - Derek Brase RN, CM- pt for transition home today- scripts for Eliquis on chart- CM has spoken with [facility] pharmacy who will plan to fill and deliver Eliquis to the bedside. No further CM needs noted for transition home.   Darrold Span, RN 10/02/2018, 12:26 PM

## 2018-10-07 DIAGNOSIS — Z79899 Other long term (current) drug therapy: Secondary | ICD-10-CM | POA: Diagnosis not present

## 2018-10-07 DIAGNOSIS — I2782 Chronic pulmonary embolism: Secondary | ICD-10-CM | POA: Diagnosis not present

## 2018-10-07 DIAGNOSIS — J4520 Mild intermittent asthma, uncomplicated: Secondary | ICD-10-CM | POA: Diagnosis not present

## 2018-10-07 DIAGNOSIS — I2510 Atherosclerotic heart disease of native coronary artery without angina pectoris: Secondary | ICD-10-CM | POA: Diagnosis not present

## 2018-10-07 DIAGNOSIS — R7303 Prediabetes: Secondary | ICD-10-CM | POA: Diagnosis not present

## 2018-10-07 DIAGNOSIS — N183 Chronic kidney disease, stage 3 (moderate): Secondary | ICD-10-CM | POA: Diagnosis not present

## 2018-10-07 DIAGNOSIS — I255 Ischemic cardiomyopathy: Secondary | ICD-10-CM | POA: Diagnosis not present

## 2018-10-07 DIAGNOSIS — I129 Hypertensive chronic kidney disease with stage 1 through stage 4 chronic kidney disease, or unspecified chronic kidney disease: Secondary | ICD-10-CM | POA: Diagnosis not present

## 2018-10-08 ENCOUNTER — Ambulatory Visit: Payer: PPO | Admitting: Cardiology

## 2018-10-15 ENCOUNTER — Encounter: Payer: Self-pay | Admitting: Cardiology

## 2018-10-15 ENCOUNTER — Ambulatory Visit (INDEPENDENT_AMBULATORY_CARE_PROVIDER_SITE_OTHER): Payer: PPO | Admitting: Cardiology

## 2018-10-15 VITALS — BP 122/78 | HR 59 | Ht 74.0 in | Wt 200.0 lb

## 2018-10-15 DIAGNOSIS — Z7901 Long term (current) use of anticoagulants: Secondary | ICD-10-CM

## 2018-10-15 DIAGNOSIS — E782 Mixed hyperlipidemia: Secondary | ICD-10-CM

## 2018-10-15 DIAGNOSIS — I255 Ischemic cardiomyopathy: Secondary | ICD-10-CM

## 2018-10-15 DIAGNOSIS — I2699 Other pulmonary embolism without acute cor pulmonale: Secondary | ICD-10-CM | POA: Diagnosis not present

## 2018-10-15 DIAGNOSIS — I25119 Atherosclerotic heart disease of native coronary artery with unspecified angina pectoris: Secondary | ICD-10-CM

## 2018-10-15 DIAGNOSIS — I10 Essential (primary) hypertension: Secondary | ICD-10-CM | POA: Diagnosis not present

## 2018-10-15 NOTE — Patient Instructions (Signed)

## 2018-10-15 NOTE — Progress Notes (Signed)
Cardiology Office Note:    Date:  10/17/2018   ID:  Derek Hart, DOB December 25, 1950, MRN [id number]  PCP:  Gordan Payment., MD  Cardiologist:  Norman Herrlich, MD    Referring MD: Gordan Payment., MD    ASSESSMENT:    1. Chronic anticoagulation   2. Other pulmonary embolism without acute cor pulmonale, unspecified chronicity (HCC)   3. Ischemic cardiomyopathy   4. Coronary artery disease involving native coronary artery of native heart with angina pectoris (HCC)   5. Essential hypertension   6. Mixed hyperlipidemia    PLAN:    In order of problems listed above:  1. He has had recurrent unprovoked pulmonary embolism is hypercoagulable and remain on long-term anticoagulation 6 months we will consider decreasing to prophylactic dose Eliquis. 2. Improved no evidence of hemodynamic compromise and I do not think he requires a follow-up echocardiogram at this time 3. Continues to be improved EF is normalized continue guideline directed medical therapy with Entresto beta-blocker carvedilol at this time does not require a diuretic. 4. Stable CAD continue medical treatment including beta-blocker aspirin and high intensity statin 5. Blood pressure stable at target continue current treatment 6. Stable CAD high risk with and continue his high intensity statin.  Lipid profile 08/13/2018 shows a cholesterol 147 HDL 98 LDL 100 H I think with hyperalgia HDL that this is a good clinical outcome and I would not intensify his treatment at this time    Next appointment: 6 months   Medication Adjustments/Labs and Tests Ordered: Current medicines are reviewed at length with the patient today.  Concerns regarding medicines are outlined above.  No orders of the defined types were placed in this encounter.  No orders of the defined types were placed in this encounter.   Chief Complaint  Patient presents with  . Follow-up    recurrent PE    History of Present Illness:    Derek Hart is a 67  y.o. male with a hx of  hx of CAD and old MI, PCI of LAD and RCA, Cir in 2015, and distal LAD 01/2017 after a NSTEMI, mild LV dysfunction with EF 45% in March 2018, dyslipidemia and COPD and  AN UNPROVOKED PULMONARY EMBOLISM WITH EF REDUCED TO 35-40% last seen 03/05/18.  He was treated medically including ARNI and subsequent echocardiogram performed 05/26/2018 showed improvement ejection fraction 50 to 55%  last seen 09/02/18.  He is recently admitted to [facility] with recurrent pulmonary embolism.  He had an echocardiogram performed showed an ejection fraction of 50 to 55% with akinesia and scarring of the apex and left atrial enlargement.  With recurrent pulmonary embolism he was anticoagulated with heparin and then transition to oral anticoagulants.  Plavix was discontinued.  Pulmonary CT angiography showed embolism with and segmental branches of the left lower lobe and evidence of right heart strain.  Venous duplex lower extremity showed no evidence of pulmonary embolism Compliance with diet, lifestyle and medications: Yes  Prior to the visit I reviewed his hospital records and recommendations for ongoing anticoagulant therapy.  He is markedly improved and is having no shortness of breath pleuritic chest pain hemoptysis or lower extremity edema or pain.  He has had no palpitation or syncope.  His concern is the ability to afford to particular medications Entresto and Eliquis and is asked for our office to assist him in these endeavors.  He regrets having stopped his anticoagulant and agrees to indefinite anticoagulation and after 6 months I will  reduce him to long-term prophylactic dose Eliquis.  LV function remains normal previously had severe left ventricular dysfunction heart failure and will continue his guideline directed therapy including Eliquis and Entresto and voices understanding of the necessity in agreement to comply Past Medical History:  Diagnosis Date  . Allergic rhinitis 07/19/2016    . Asthma 07/19/2016  . Atherosclerotic heart disease of native coronary artery without angina pectoris 07/28/2015    CAD/ MI/ CABG  Overview:  Leinaala Catanese.  MI LAD stent. 01/2017 Overview:  He had MI with staged PCI of LAD and later RCA and LCF 2015, EF 40% Overview:  Added automatically from request for surgery [id number]  . Cardiomyopathy (HCC) 07/19/2016   Overview:  EF 45% or 54% 01/2017  . Chest pain in adult 02/01/2017  . Chronic bronchitis (HCC)   . COPD (chronic obstructive pulmonary disease) (HCC) 04/26/2015   PFT-06/27/2015-minimal obstructive airways disease with insignificant response to bronchodilator, minimal diffusion defect, normal lung volumes. FEV1/FVC 0.76, DLCO 72%  . Coronary artery disease   . Dyspnea on exertion 04/26/2015  . Emphysema lung (HCC)    "treated for asthma but later found out it was emphysema" (10/15/2017)  . Essential hypertension 07/19/2016  . GERD (gastroesophageal reflux disease)   . High risk medication use 07/19/2016  . Hyperlipidemia    "left lung"  . Hypertension   . Inguinal hernia 07/19/2016   Overview:  Mild bilateral.  . Lung nodule seen on imaging study 07/28/2015   chest x-ray report from 03/05/2014 at [facility] described a vague nodular density in the left upper lobe and recommended follow-up CT scan which was never done.   . LV dysfunction 05/30/2017  . MI (myocardial infarction) (HCC) 12/2013; 01/2017  . Mixed hyperlipidemia 07/19/2016  . 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 Prediabetes 07/19/2016  . Pulmonary embolism (HCC) 10/15/2017  . Stage 3 chronic kidney disease (HCC) 07/19/2016  . STEMI (ST elevation myocardial infarction) [facility]) 10/18/2017   Feb 2015    Past Surgical History:  Procedure Laterality Date  . CARDIAC CATHETERIZATION  11/2016  . CATARACT EXTRACTION W/ INTRAOCULAR LENS  IMPLANT, BILATERAL Bilateral   . CORONARY ANGIOPLASTY WITH STENT PLACEMENT  12/2013-02/2014   "2 + 3 stents"  . CORONARY ANGIOPLASTY WITH STENT PLACEMENT   01/2017   distal LAD  . KNEE ARTHROSCOPY Right 1982    Current Medications: Current Meds  Medication Sig  . albuterol (PROVENTIL HFA;VENTOLIN HFA) 108 (90 Base) MCG/ACT inhaler Inhale 1 puff into the lungs every 6 (six) hours as needed for wheezing or shortness of breath.  Marland Kitchen aspirin EC 81 MG tablet Take 1 tablet (81 mg total) by mouth daily.  Marland Kitchen atorvastatin (LIPITOR) 80 MG tablet Take 80 mg by mouth every evening.   . carvedilol (COREG) 12.5 MG tablet TAKE 1 AND 1/2 tablets BY MOUTH TWICE (2) DAILY (Patient taking differently: Take 18.75 mg by mouth 2 (two) times daily with a meal. )  . Cyanocobalamin (B-12 PO) Take 1 tablet by mouth daily.   Marland Kitchen ELIQUIS STARTER PACK (ELIQUIS STARTER PACK) 5 MG TABS Take as directed on package: start with two-5mg  tablets twice daily for 7 days. On day 8, switch to one-5mg  tablet twice daily.  . mometasone-formoterol (DULERA) 200-5 MCG/ACT AERO Inhale 2 puffs into the lungs every evening.  . montelukast (SINGULAIR) 10 MG tablet Take 10 mg by mouth daily.  . nitroGLYCERIN (NITROSTAT) 0.4 MG SL tablet Place 0.4 mg under the tongue every 5 (five) minutes as needed for  chest pain.  Marland Kitchen omeprazole (PRILOSEC) 40 MG capsule Take 40 mg by mouth daily.  . sacubitril-valsartan (ENTRESTO) 97-103 MG Take 1 tablet by mouth 2 (two) times daily.     Allergies:   Pneumovax 23 [pneumococcal vac polyvalent]   Social History   Socioeconomic History  . Marital status: Married    Spouse name: Not on file  . Number of children: Not on file  . Years of education: Not on file  . Highest education level: Not on file  Occupational History  . Not on file  Social Needs  . Financial resource strain: Not on file  . Food insecurity:    Worry: Not on file    Inability: Not on file  . Transportation needs:    Medical: Not on file    Non-medical: Not on file  Tobacco Use  . Smoking status: Former Smoker    Packs/day: 1.00    Years: 15.00    Pack years: 15.00    Types:  Cigarettes    Last attempt to quit: 11/19/1976    Years since quitting: 41.9  . Smokeless tobacco: Former Neurosurgeon    Types: Chew    Quit date: 1980  Substance and Sexual Activity  . Alcohol use: No    Frequency: Never  . Drug use: No  . Sexual activity: Not Currently  Lifestyle  . Physical activity:    Days per week: Not on file    Minutes per session: Not on file  . Stress: Not on file  Relationships  . Social connections:    Talks on phone: Not on file    Gets together: Not on file    Attends religious service: Not on file    Active member of club or organization: Not on file    Attends meetings of clubs or organizations: Not on file    Relationship status: Not on file  Other Topics Concern  . Not on file  Social History Narrative  . Not on file     Family History: The patient's family history includes Diabetes in his mother; Heart attack in his maternal grandmother and maternal uncle; Hyperlipidemia in his mother; Hypertension in his mother. ROS:   Please see the history of present illness.    All other systems reviewed and are negative.  EKGs/Labs/Other Studies Reviewed:    The following studies were reviewed today:  EKG:  EKG 09/30/2018 independently reviewed sinus rhythm left axis deviation old anterior MI.  I also independently reviewed his chest x-ray and cardiac CTA prior to the visit Recent Labs: 12/05/2017: BNP 58.3 10/02/2018: BUN 13; Creatinine, Ser 0.96; Hemoglobin 13.9; Platelets 190; Potassium 4.0; Sodium 135  Recent Lipid Panel No results found for: CHOL, TRIG, HDL, CHOLHDL, VLDL, LDLCALC, LDLDIRECT  Physical Exam:    VS:  BP 122/78 (BP Location: Right Arm, Patient Position: Sitting, Cuff Size: Normal)   Pulse (!) 59   Ht 6\' 2"  (1.88 m)   Wt 200 lb (90.7 kg)   SpO2 98%   BMI 25.68 kg/m     Wt Readings from Last 3 Encounters:  10/15/18 200 lb (90.7 kg)  10/02/18 195 lb 3.2 oz (88.5 kg)  09/02/18 202 lb (91.6 kg)     GEN:  Well nourished, well  developed in no acute distress HEENT: Normal NECK: No JVD; No carotid bruits LYMPHATICS: No lymphadenopathy CARDIAC: RRR, no murmurs, rubs, gallops RESPIRATORY:  Clear to auscultation without rales, wheezing or rhonchi  ABDOMEN: Soft, non-tender, non-distended MUSCULOSKELETAL:  No  edema; No deformity  SKIN: Warm and dry NEUROLOGIC:  Alert and oriented x 3 PSYCHIATRIC:  Normal affect    Signed, Norman Herrlich, MD  10/17/2018 2:39 PM    [facility]

## 2018-11-21 ENCOUNTER — Telehealth: Payer: Self-pay | Admitting: Cardiology

## 2018-11-21 ENCOUNTER — Other Ambulatory Visit: Payer: Self-pay | Admitting: Emergency Medicine

## 2018-11-21 ENCOUNTER — Other Ambulatory Visit: Payer: Self-pay

## 2018-11-21 DIAGNOSIS — I255 Ischemic cardiomyopathy: Secondary | ICD-10-CM

## 2018-11-21 MED ORDER — SACUBITRIL-VALSARTAN 97-103 MG PO TABS: 1.0000 | ORAL_TABLET | Freq: Two times a day (BID) | ORAL | 3 refills | Status: DC

## 2018-11-21 MED ORDER — APIXABAN 5 MG PO TABS: 5.0000 mg | ORAL_TABLET | Freq: Two times a day (BID) | ORAL | 5 refills | Status: DC

## 2018-11-21 NOTE — Telephone Encounter (Signed)
Yes eliquis 5 mg BID disp 1 month refill 5

## 2018-11-21 NOTE — Telephone Encounter (Signed)
Call eliquis to [place] [facility]

## 2018-11-21 NOTE — Telephone Encounter (Signed)
Will consult with Dr. Dulce Sellar regarding this refill.

## 2018-11-21 NOTE — Telephone Encounter (Signed)
Rx for [employer] sent to [facility] as requested.

## 2018-11-21 NOTE — Telephone Encounter (Addendum)
Medication refilled per Dr. Munley.  

## 2018-12-10 ENCOUNTER — Telehealth: Payer: Self-pay | Admitting: Cardiology

## 2018-12-10 NOTE — Telephone Encounter (Signed)
Patient needs to talk to you about possibly switching Entresto and Eliquis and he needs to discuss.Marland Kitchen

## 2018-12-11 NOTE — Telephone Encounter (Signed)
Patient states that he has two insurance plans and his [employer] is not wanting to cover the cost of entresto now. He has talked to his other insurance company, [facility], and they have agreed to cover entresto this year. Patient wants to switch the insurance coverage for [place]. Advised patient to contact his pharmacy to make this change and to contact our office with further questions or concerns. Patient verbalized understanding.

## 2019-01-07 DIAGNOSIS — J111 Influenza due to unidentified influenza virus with other respiratory manifestations: Secondary | ICD-10-CM | POA: Diagnosis not present

## 2019-02-11 DIAGNOSIS — I2510 Atherosclerotic heart disease of native coronary artery without angina pectoris: Secondary | ICD-10-CM | POA: Diagnosis not present

## 2019-02-11 DIAGNOSIS — Z79899 Other long term (current) drug therapy: Secondary | ICD-10-CM | POA: Diagnosis not present

## 2019-02-11 DIAGNOSIS — J4520 Mild intermittent asthma, uncomplicated: Secondary | ICD-10-CM | POA: Diagnosis not present

## 2019-02-11 DIAGNOSIS — R7303 Prediabetes: Secondary | ICD-10-CM | POA: Diagnosis not present

## 2019-02-11 DIAGNOSIS — N183 Chronic kidney disease, stage 3 (moderate): Secondary | ICD-10-CM | POA: Diagnosis not present

## 2019-02-11 DIAGNOSIS — I129 Hypertensive chronic kidney disease with stage 1 through stage 4 chronic kidney disease, or unspecified chronic kidney disease: Secondary | ICD-10-CM | POA: Diagnosis not present

## 2019-02-11 DIAGNOSIS — I2782 Chronic pulmonary embolism: Secondary | ICD-10-CM | POA: Diagnosis not present

## 2019-02-11 DIAGNOSIS — R319 Hematuria, unspecified: Secondary | ICD-10-CM | POA: Diagnosis not present

## 2019-02-11 DIAGNOSIS — E782 Mixed hyperlipidemia: Secondary | ICD-10-CM | POA: Diagnosis not present

## 2019-02-17 DIAGNOSIS — R319 Hematuria, unspecified: Secondary | ICD-10-CM | POA: Diagnosis not present

## 2019-02-18 ENCOUNTER — Other Ambulatory Visit: Payer: Self-pay | Admitting: Cardiology

## 2019-03-18 ENCOUNTER — Other Ambulatory Visit: Payer: Self-pay | Admitting: Cardiology

## 2019-03-18 DIAGNOSIS — I255 Ischemic cardiomyopathy: Secondary | ICD-10-CM

## 2019-04-14 ENCOUNTER — Telehealth: Payer: PPO | Admitting: Cardiology

## 2019-04-20 ENCOUNTER — Other Ambulatory Visit: Payer: Self-pay | Admitting: Cardiology

## 2019-05-15 ENCOUNTER — Other Ambulatory Visit: Payer: Self-pay | Admitting: Cardiology

## 2019-05-18 NOTE — Progress Notes (Signed)
Cardiology Office Note:    Date:  05/19/2019   ID:  Emerson Monte, DOB [birth year], MRN [id number]  PCP:  Raina Mina., MD  Cardiologist:  Shirlee More, MD    Referring MD: Raina Mina., MD    ASSESSMENT:    1. Coronary artery disease involving native coronary artery of native heart with angina pectoris ([place])   2. Essential hypertension   3. Ischemic cardiomyopathy    PLAN:    In order of problems listed above:  1. CAD - Stable, no anginal symptoms. GDMT aspirin, beta blocker, statin. DAPT previously discontinued in the setting of Eliquis for PE. BMET today.  2. HTN - Stable, well controlled today. Continue current anti-hypertensive regimen. 3. Ischemic cardiomyopathy - Stable, no edema. Some non-limiting SOB which he attributes to his asthma. EF normalized on most recent echo. Continue GDMT beta blocker and maximum dose Entresto.  4. Anticoagulation - Secondary to 2 unprovoked PE. No bleeding complications. Continue Eliquis 5mg  BID. CBC today.   Next appointment: 6 months    Medication Adjustments/Labs and Tests Ordered: Current medicines are reviewed at length with the patient today.  Concerns regarding medicines are outlined above.  Orders Placed This Encounter  Procedures  . Comprehensive Metabolic Panel (CMET)  . CBC  . Lipid Profile   No orders of the defined types were placed in this encounter.   Chief Complaint  Patient presents with  . Follow-up    for   . Anticoagulation    with recurrent PE  . Cardiomyopathy  . Coronary Artery Disease  . Hypertension  . Hyperlipidemia    History of Present Illness:    Joshoa Shawler is a 68 y.o. male with a hx  of CAD and old MI, PCI of LAD and RCA, Cir in 2015, and distal LAD 01/2017 after a NSTEMI, mild LV dysfunction with EF 45% in March 2018, dyslipidemia and COPD and  AN UNPROVOKED PULMONARY EMBOLISM WITH EF REDUCED TO 35-40% last seen 03/05/18.  He was treated medically including ARNI and subsequent  echocardiogram performed 05/26/2018 showed improvement ejection fraction 50 to 55%. last seen 10/15/2018 after recurrent unprovoked pulmonary embolism.  He reports feeling well. Has lost weight recently due to adhering to a heart healthy diet and eating mostly at home. Denies chest pain. Some shortness of breath with exertion and intermittent wheeze, reports that it is "about time for my kenalog shot" that he receives every 6 months for his history of asthma. Reports no exercise intolerance, but has not been keeping up his walking regimen.   Denies bleeding complications including hematuria and melena. Reports compliance with his eliquis.   Compliance with diet, lifestyle and medications: Yes. He is compliant with diet and medications. Have asked him to restart his walking regimen.  Past Medical History:  Diagnosis Date  . Allergic rhinitis 07/19/2016  . Asthma 07/19/2016  . Atherosclerotic heart disease of native coronary artery without angina pectoris 07/28/2015    CAD/ MI/ CABG  Overview:  Charmon Thorson.  MI LAD stent. 01/2017 Overview:  He had MI with staged PCI of LAD and later RCA and LCF 2015, EF 40% Overview:  Added automatically from request for surgery [id number]  . Cardiomyopathy ([place]) 07/19/2016   Overview:  EF 45% or 54% 01/2017  . Chest pain in adult 02/01/2017  . Chronic bronchitis ([place])   . COPD (chronic obstructive pulmonary disease) ([place]) 04/26/2015   PFT-06/27/2015-minimal obstructive airways disease with insignificant response to bronchodilator, minimal diffusion defect, normal lung volumes. FEV1/FVC  0.76, DLCO 72%  . Coronary artery disease   . Dyspnea on exertion 04/26/2015  . Emphysema lung (HCC)    "treated for asthma but later found out it was emphysema" (10/15/2017)  . Essential hypertension 07/19/2016  . GERD (gastroesophageal reflux disease)   . High risk medication use 07/19/2016  . Hyperlipidemia    "left lung"  . Hypertension   . Inguinal hernia 07/19/2016   Overview:  Mild  bilateral.  . Lung nodule seen on imaging study 07/28/2015   chest x-[facility] report from 03/05/2014 at [facility] described a vague nodular density in the left upper lobe and recommended follow-up CT scan which was never done.   . LV dysfunction 05/30/2017  . MI (myocardial infarction) (HCC) 12/2013; 01/2017  . Mixed hyperlipidemia 07/19/2016  . 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 Prediabetes 07/19/2016  . Pulmonary embolism (HCC) 10/15/2017  . Stage 3 chronic kidney disease (HCC) 07/19/2016  . STEMI (ST elevation myocardial infarction) [facility](HCC) 10/18/2017   Feb 2015    Past Surgical History:  Procedure Laterality Date  . CARDIAC CATHETERIZATION  11/2016  . CATARACT EXTRACTION W/ INTRAOCULAR LENS  IMPLANT, BILATERAL Bilateral   . CORONARY ANGIOPLASTY WITH STENT PLACEMENT  12/2013-02/2014   "2 + 3 stents"  . CORONARY ANGIOPLASTY WITH STENT PLACEMENT  01/2017   distal LAD  . KNEE ARTHROSCOPY Right 1982    Current Medications: Current Meds  Medication Sig  . albuterol (PROVENTIL HFA;VENTOLIN HFA) 108 (90 Base) MCG/ACT inhaler Inhale 1 puff into the lungs every 6 (six) hours as needed for wheezing or shortness of breath.  Marland Kitchen. apixaban (ELIQUIS) 5 MG TABS tablet Take 1 tablet (5 mg total) by mouth 2 (two) times daily.  Marland Kitchen. aspirin EC 81 MG tablet Take 1 tablet (81 mg total) by mouth daily.  Marland Kitchen. atorvastatin (LIPITOR) 80 MG tablet Take 80 mg by mouth every evening.   . carvedilol (COREG) 12.5 MG tablet TAKE 1 AND 1/2 TABLETS BY MOUTH TWICE (2) DAILY  . Cyanocobalamin (B-12 PO) Take 1 tablet by mouth daily.   Marland Kitchen. ENTRESTO 97-103 MG TAKE 1 TABLET BY MOUTH TWICE (2) DAILY  . mometasone-formoterol (DULERA) 200-5 MCG/ACT AERO Inhale 2 puffs into the lungs every evening.  . montelukast (SINGULAIR) 10 MG tablet Take 10 mg by mouth daily.  . nitroGLYCERIN (NITROSTAT) 0.4 MG SL tablet Place 0.4 mg under the tongue every 5 (five) minutes as needed for chest pain.  Marland Kitchen. omeprazole (PRILOSEC) 40 MG capsule  Take 40 mg by mouth daily.     Allergies:   Pneumovax 23 [pneumococcal vac polyvalent]   Social History   Socioeconomic History  . Marital status: Married    Spouse name: Not on file  . Number of children: Not on file  . Years of education: Not on file  . Highest education level: Not on file  Occupational History  . Not on file  Social Needs  . Financial resource strain: Not on file  . Food insecurity    Worry: Not on file    Inability: Not on file  . Transportation needs    Medical: Not on file    Non-medical: Not on file  Tobacco Use  . Smoking status: Former Smoker    Packs/day: 1.00    Years: 15.00    Pack years: 15.00    Types: Cigarettes    Quit date: 11/19/1976    Years since quitting: 42.5  . Smokeless tobacco: Former NeurosurgeonUser    Types: Sports administratorChew  Quit date: 5  Substance and Sexual Activity  . Alcohol use: No    Frequency: Never  . Drug use: No  . Sexual activity: Not Currently  Lifestyle  . Physical activity    Days per week: Not on file    Minutes per session: Not on file  . Stress: Not on file  Relationships  . Social Musician on phone: Not on file    Gets together: Not on file    Attends religious service: Not on file    Active member of club or organization: Not on file    Attends meetings of clubs or organizations: Not on file    Relationship status: Not on file  Other Topics Concern  . Not on file  Social History Narrative  . Not on file     Family History: The patient's family history includes Diabetes in his mother; Heart attack in his maternal grandmother and maternal uncle; Hyperlipidemia in his mother; Hypertension in his mother. ROS:   Please see the history of present illness.    Review of Systems  Constitution: Negative for diaphoresis, fever and malaise/fatigue.  Cardiovascular: Negative for chest pain, dyspnea on exertion, irregular heartbeat, leg swelling, near-syncope, palpitations and paroxysmal nocturnal dyspnea.   Respiratory: Positive for shortness of breath ("my asthma") and wheezing ("intermittent"). Negative for cough and sleep disturbances due to breathing.   Gastrointestinal: Negative for melena.  Genitourinary: Negative for hematuria.  Neurological: Negative for dizziness, light-headedness and weakness.    All other systems reviewed and are negative.  EKGs/Labs/Other Studies Reviewed:    The following studies were reviewed today: Echo 09/30/18   - Left ventricle: The cavity size was normal. Systolic function was   normal. The estimated ejection fraction was in the range of 50%   to 55%. Akinesis and scarring of the apical myocardium;   consistent with infarction in the distribution of the left   anterior descending coronary artery. Doppler parameters are   consistent with abnormal left ventricular relaxation (grade 1   diastolic dysfunction). Acoustic contrast opacification revealed   no evidence ofthrombus. - Left atrium: The atrium was mildly dilated.      Recent Labs: 10/02/2018: BUN 13; Creatinine, Ser 0.96; Hemoglobin 13.9; Platelets 190; Potassium 4.0; Sodium 135  Recent Lipid Panel No results found for: CHOL, TRIG, HDL, CHOLHDL, VLDL, LDLCALC, LDLDIRECT  Physical Exam:    VS:  BP 128/84 (BP Location: Left Arm, Patient Position: Sitting, Cuff Size: Normal)   Pulse 68   Temp (!) 97.5 F (36.4 C)   Wt 196 lb 1.9 oz (89 kg)   SpO2 95%   BMI 25.18 kg/m     Wt Readings from Last 3 Encounters:  05/19/19 196 lb 1.9 oz (89 kg)  10/15/18 200 lb (90.7 kg)  10/02/18 195 lb 3.2 oz (88.5 kg)     GEN:  Well nourished, well developed in no acute distress HEENT: Normal NECK: No JVD; No carotid bruits LYMPHATICS: No lymphadenopathy CARDIAC: RRR, no murmurs, rubs, gallops VVS: R and L radial pulse 2+. R and L PT pulse 2+.  RESPIRATORY:  Clear to auscultation without rales, wheezing or rhonchi  ABDOMEN: Soft, non-tender, non-distended MUSCULOSKELETAL:  No edema; No deformity   SKIN: Warm and dry. Small quarter sized bruises to forearm in the setting of anticoagulation.  NEUROLOGIC:  Alert and oriented x 3 PSYCHIATRIC:  Normal affect    Signed, Norman Herrlich, MD  05/19/2019 8:35 AM    [facility]

## 2019-05-19 ENCOUNTER — Ambulatory Visit (INDEPENDENT_AMBULATORY_CARE_PROVIDER_SITE_OTHER): Payer: PPO | Admitting: Cardiology

## 2019-05-19 ENCOUNTER — Other Ambulatory Visit: Payer: Self-pay

## 2019-05-19 ENCOUNTER — Encounter: Payer: Self-pay | Admitting: Cardiology

## 2019-05-19 VITALS — BP 128/84 | HR 68 | Temp 97.5°F | Wt 196.1 lb

## 2019-05-19 DIAGNOSIS — Z7901 Long term (current) use of anticoagulants: Secondary | ICD-10-CM

## 2019-05-19 DIAGNOSIS — I255 Ischemic cardiomyopathy: Secondary | ICD-10-CM

## 2019-05-19 DIAGNOSIS — I25119 Atherosclerotic heart disease of native coronary artery with unspecified angina pectoris: Secondary | ICD-10-CM | POA: Diagnosis not present

## 2019-05-19 DIAGNOSIS — J4520 Mild intermittent asthma, uncomplicated: Secondary | ICD-10-CM | POA: Diagnosis not present

## 2019-05-19 DIAGNOSIS — N183 Chronic kidney disease, stage 3 (moderate): Secondary | ICD-10-CM | POA: Diagnosis not present

## 2019-05-19 DIAGNOSIS — J40 Bronchitis, not specified as acute or chronic: Secondary | ICD-10-CM | POA: Diagnosis not present

## 2019-05-19 DIAGNOSIS — I2782 Chronic pulmonary embolism: Secondary | ICD-10-CM | POA: Diagnosis not present

## 2019-05-19 DIAGNOSIS — I10 Essential (primary) hypertension: Secondary | ICD-10-CM | POA: Diagnosis not present

## 2019-05-19 NOTE — Patient Instructions (Signed)
Medication Instructions:  Your physician recommends that you continue on your current medications as directed. Please refer to the Current Medication list given to you today.  If you need a refill on your cardiac medications before your next appointment, please call your pharmacy.   Lab work: Your physician recommends that you return for lab work in: TODAY CMP,CBC,LIPID  If you have labs (blood work) drawn today and your tests are completely normal, you will receive your results only by: Marland Kitchen MyChart Message (if you have MyChart) OR . A paper copy in the mail If you have any lab test that is abnormal or we need to change your treatment, we will call you to review the results.  Testing/Procedures: None  Follow-Up: At [facility], you and your health needs are our priority.  As part of our continuing mission to provide you with exceptional heart care, we have created designated Provider Care Teams.  These Care Teams include your primary Cardiologist (physician) and Advanced Practice Providers (APPs -  Physician Assistants and Nurse Practitioners) who all work together to provide you with the care you need, when you need it. You will need a follow up appointment in 6 months.   Any Other Special Instructions Will Be Listed Below (If Applicable).

## 2019-05-20 LAB — COMPREHENSIVE METABOLIC PANEL
ALT: 23 IU/L (ref 0–44)
AST: 18 IU/L (ref 0–40)
Albumin/Globulin Ratio: 1.9 (ref 1.2–2.2)
Albumin: 4 g/dL (ref 3.8–4.8)
Alkaline Phosphatase: 131 IU/L — ABNORMAL HIGH (ref 39–117)
BUN/Creatinine Ratio: 16 (ref 10–24)
BUN: 20 mg/dL (ref 8–27)
Bilirubin Total: 0.6 mg/dL (ref 0.0–1.2)
CO2: 22 mmol/L (ref 20–29)
Calcium: 9.1 mg/dL (ref 8.6–10.2)
Chloride: 107 mmol/L — ABNORMAL HIGH (ref 96–106)
Creatinine, Ser: 1.22 mg/dL (ref 0.76–1.27)
GFR calc Af Amer: 70 mL/min/{1.73_m2} (ref >59)
GFR calc non Af Amer: 61 mL/min/{1.73_m2} (ref >59)
Globulin, Total: 2.1 g/dL (ref 1.5–4.5)
Glucose: 120 mg/dL — ABNORMAL HIGH (ref 65–99)
Potassium: 3.8 mmol/L (ref 3.5–5.2)
Sodium: 139 mmol/L (ref 134–144)
Total Protein: 6.1 g/dL (ref 6.0–8.5)

## 2019-05-20 LAB — CBC
Hematocrit: 42.7 % (ref 37.5–51.0)
Hemoglobin: 14.3 g/dL (ref 13.0–17.7)
MCH: 29.5 pg (ref 26.6–33.0)
MCHC: 33.5 g/dL (ref 31.5–35.7)
MCV: 88 fL (ref 79–97)
Platelets: 196 10*3/uL (ref 150–450)
RBC: 4.85 x10E6/uL (ref 4.14–5.80)
RDW: 13.5 % (ref 11.6–15.4)
WBC: 7 10*3/uL (ref 3.4–10.8)

## 2019-05-20 LAB — LIPID PANEL
Chol/HDL Ratio: 4.6 ratio (ref 0.0–5.0)
Cholesterol, Total: 151 mg/dL (ref 100–199)
HDL: 33 mg/dL — ABNORMAL LOW (ref >39)
LDL Calculated: 100 mg/dL — ABNORMAL HIGH (ref 0–99)
Triglycerides: 90 mg/dL (ref 0–149)
VLDL Cholesterol Cal: 18 mg/dL (ref 5–40)

## 2019-07-08 IMAGING — CR DG CHEST 2V
2 series · 2 of 2 positions shown · non-contrast
Comparison: Chest radiograph and CTA of the chest performed
10/15/2017

CLINICAL DATA: Acute onset of right-sided chest pain.

EXAM:
CHEST - 2 VIEW

[chest pa]
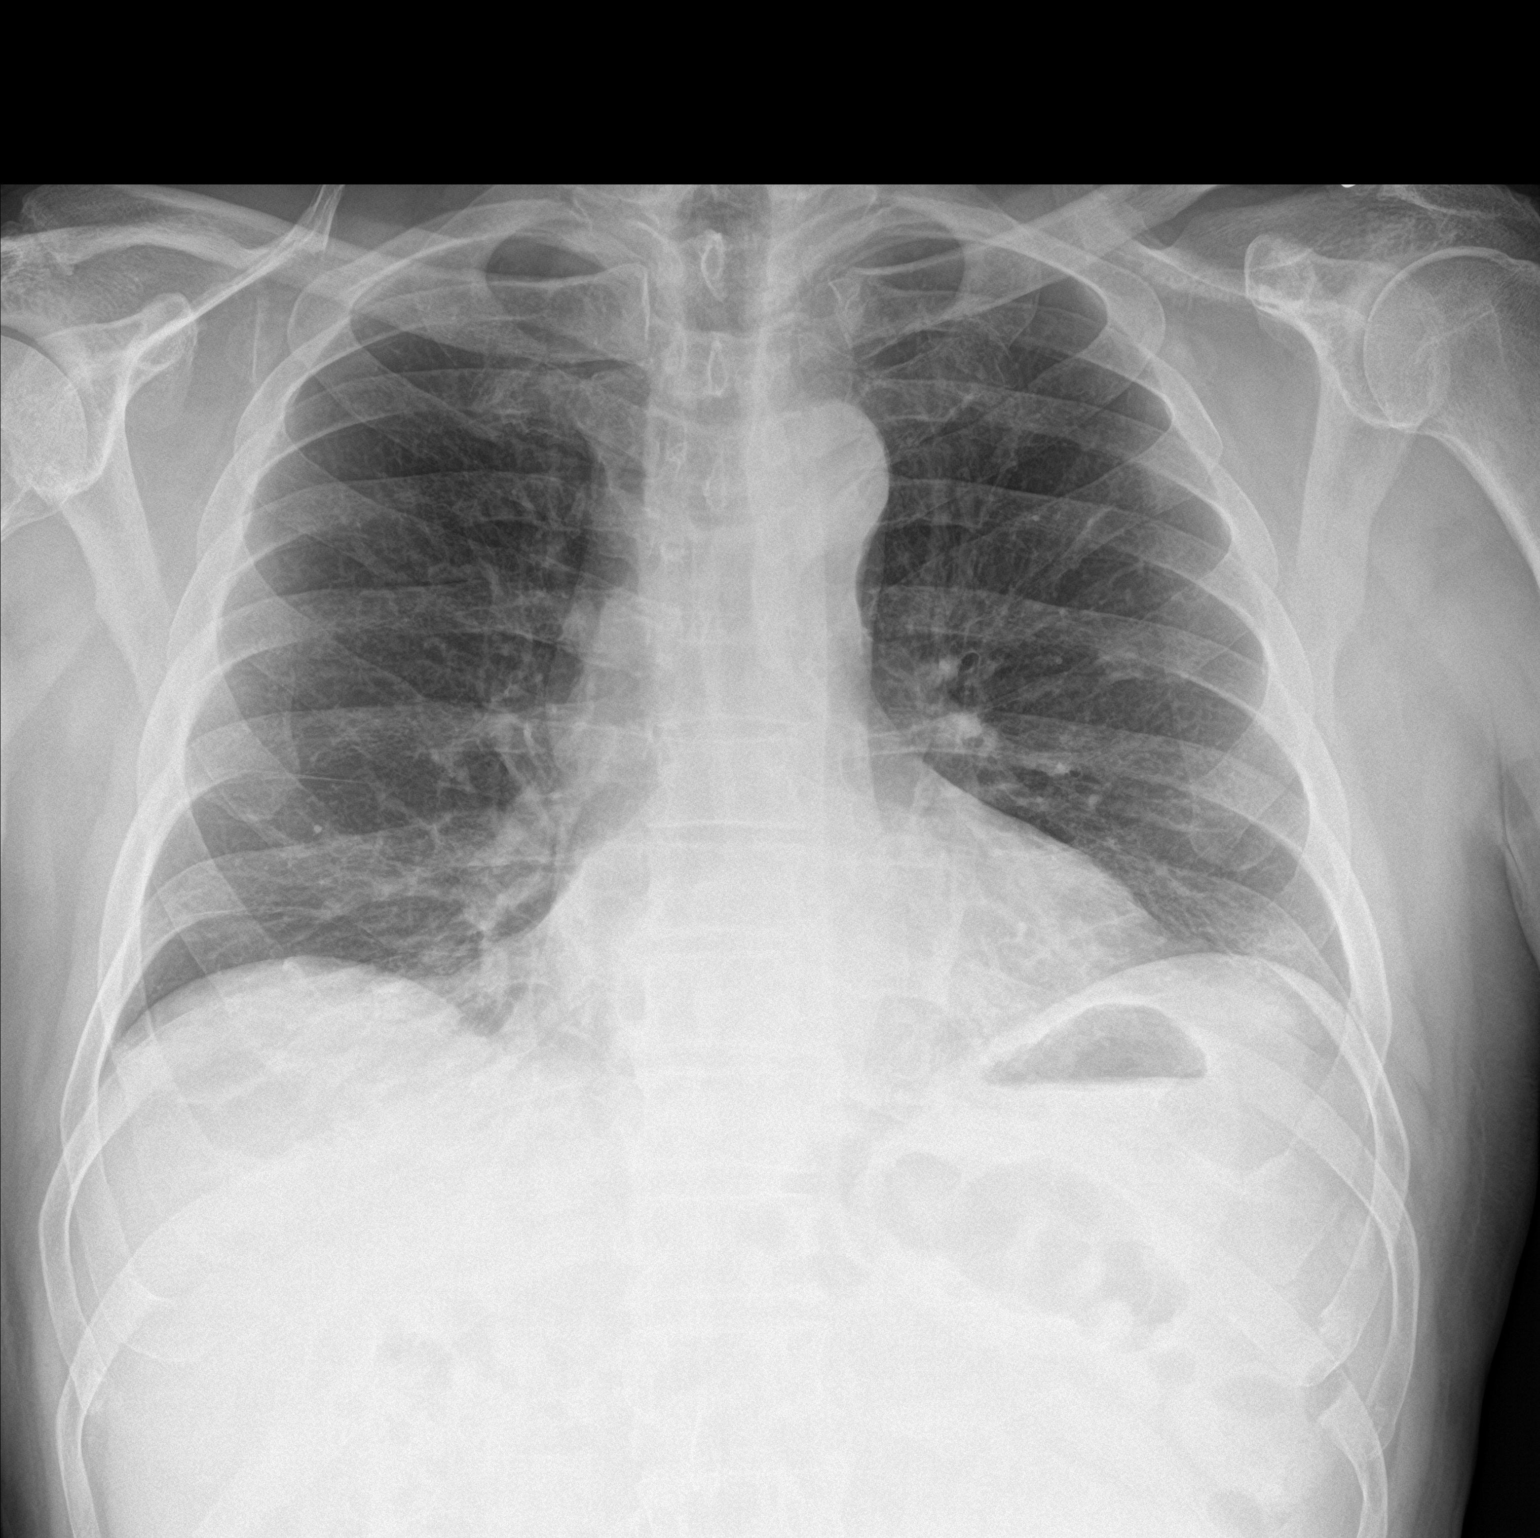

[chest lat]
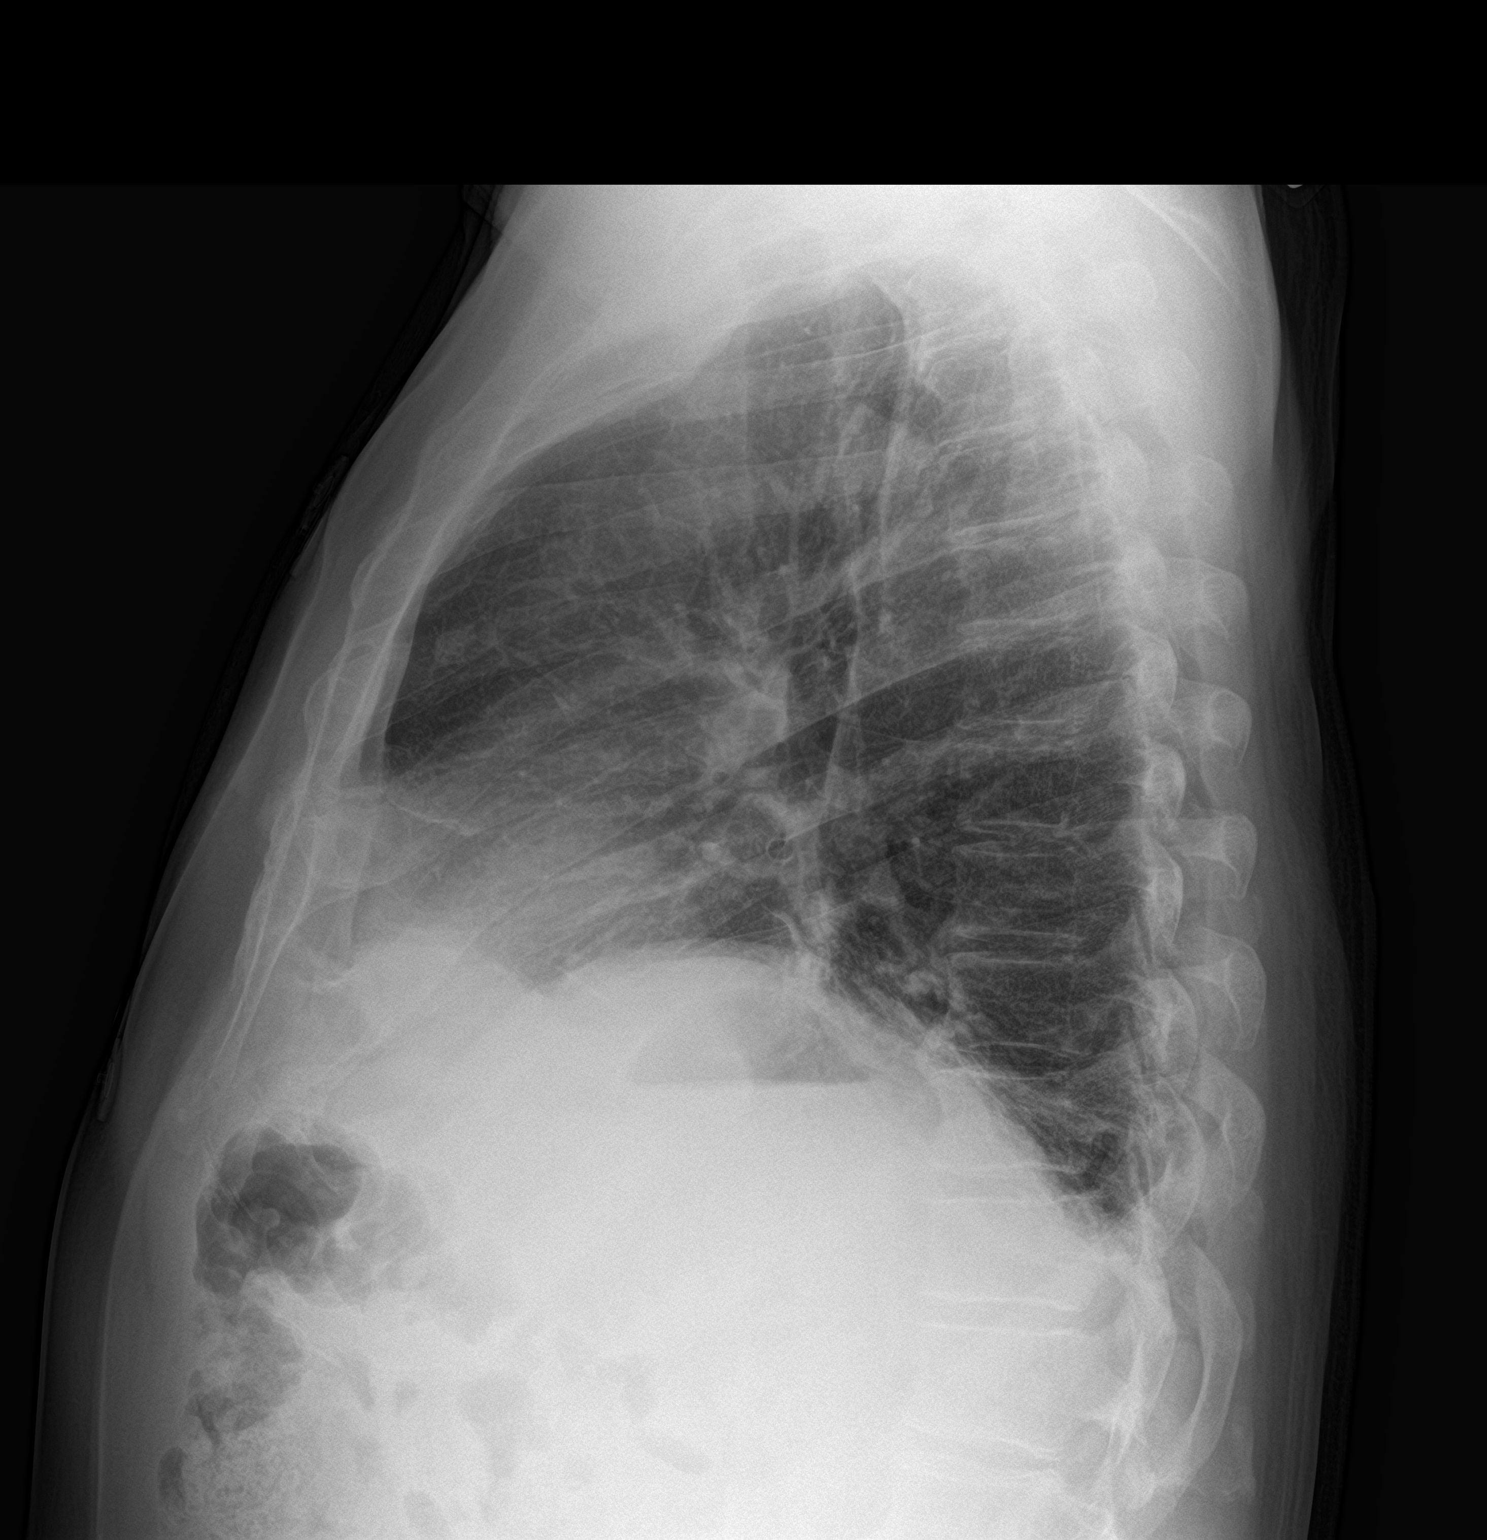

[2 of 2 positions shown; findings below may reference images not displayed]

FINDINGS: The lungs are well-aerated. Mild bibasilar opacities likely reflect
atelectasis. There is no evidence of pleural effusion or
pneumothorax.

The heart is normal in size; the mediastinal contour is within
normal limits. No acute osseous abnormalities are seen.
IMPRESSION: Mild bibasilar opacities likely reflect atelectasis; lungs otherwise
clear.

## 2019-07-31 ENCOUNTER — Other Ambulatory Visit: Payer: Self-pay | Admitting: Cardiology

## 2019-08-26 DIAGNOSIS — Z23 Encounter for immunization: Secondary | ICD-10-CM | POA: Diagnosis not present

## 2019-08-26 DIAGNOSIS — Z0000 Encounter for general adult medical examination without abnormal findings: Secondary | ICD-10-CM | POA: Diagnosis not present

## 2019-09-15 ENCOUNTER — Other Ambulatory Visit: Payer: Self-pay | Admitting: Cardiology

## 2019-09-15 DIAGNOSIS — I255 Ischemic cardiomyopathy: Secondary | ICD-10-CM

## 2019-10-19 ENCOUNTER — Other Ambulatory Visit: Payer: Self-pay | Admitting: Cardiology

## 2019-11-09 DIAGNOSIS — J189 Pneumonia, unspecified organism: Secondary | ICD-10-CM | POA: Diagnosis not present

## 2019-11-09 DIAGNOSIS — Z20828 Contact with and (suspected) exposure to other viral communicable diseases: Secondary | ICD-10-CM | POA: Diagnosis not present

## 2019-11-09 DIAGNOSIS — R05 Cough: Secondary | ICD-10-CM | POA: Diagnosis not present

## 2019-11-17 NOTE — Progress Notes (Signed)
Cardiology Office Note:    Date:  11/18/2019   ID:  Derek Hart, DOB Jan 18, 1951, MRN [id number]  PCP:  Gordan Payment., MD  Cardiologist:  Norman Herrlich, MD    Referring MD: Gordan Payment., MD    ASSESSMENT:    No diagnosis found. PLAN:    In order of problems listed above:  1. Coronary artery disease, stable having no angina on current medical treatment New York Heart Association class I and will continue combined aspirin and anticoagulant beta-blocker and his high intensity statin.  At this time he does not require an ischemia evaluation 2. Cardiomyopathy LV dysfunction stable his EF normalized with Entresto he declines a repeat echocardiogram will check a proBNP level is significantly elevated will restart the conversation and continue his current treatment including beta-blocker and Entresto.  If needed MRA cautiously with the CKD would be appropriate or SGLT2 inhibitor. 3. Hyperlipidemia stable continue high intensity statin check liver function test and lipid profile safety and efficacy 4. On anticoagulation continue the same with 2 unprovoked pulmonary embolisms clearly is hypercoagulable and tolerates combined antiplatelet and anticoagulant without bleeding complication.  He will continue taking a PPI for GI protection 5. COPD stable continue his bronchodilator as needed 6. 19 education, I asked him to start wearing medical eye protectors with a high prevalence of disease in our community 7. Stable hypertension continue guideline directed therapy   Next appointment: 6 months   Medication Adjustments/Labs and Tests Ordered: Current medicines are reviewed at length with the patient today.  Concerns regarding medicines are outlined above.  No orders of the defined types were placed in this encounter.  No orders of the defined types were placed in this encounter.   Chief Complaint  Patient presents with  . Follow-up    for   . Anticoagulation  . Coronary Artery  Disease  . Cardiomyopathy  . Hyperlipidemia    History of Present Illness:    Derek Hart is a 68 y.o. male with a hx of CAD and old MI, PCI of LAD and RCA, Cir in 2015, and distal LAD 01/2017 after a NSTEMI, mild LV dysfunction with EF 45% in March 2018, dyslipidemia and COPD and  AN UNPROVOKED PULMONARY EMBOLISM WITH EF REDUCED TO 35-40% when seen 03/05/18.  He was treated medically including ARNI and subsequent echocardiogram performed 05/26/2018 showed improvement ejection fraction 50 to 55% seen 10/15/2018 after recurrent unprovoked pulmonary embolism off anticoagulation at his decision.  He was last seen 05/12/2019. Compliance with diet, lifestyle and medications: Yes  He continues to feel well he tolerates combined antiplatelet anticoagulant without bleeding complication and were going to keep him on this regimen with recurrent unprovoked pulmonary embolism.  Since he is placed on Entresto signs and symptoms of heart failure resolved his ejection fraction normalized.  We discussed doing another echocardiogram as I told him we could intensify therapy if his EF decreased but he does not want to do that at this time we will recheck labs including proBNP and if it significantly elevated I will restart the conversation with him.  My perspective CAD has had no chest pain for heart failure no edema shortness of breath no palpitation or syncope.  He tolerates his statin without muscle weakness or pain Past Medical History:  Diagnosis Date  . Allergic rhinitis 07/19/2016  . Asthma 07/19/2016  . Atherosclerotic heart disease of native coronary artery without angina pectoris 07/28/2015    CAD/ MI/ CABG  Overview:  .  MI LAD  stent. 01/2017 Overview:  He had MI with staged PCI of LAD and later RCA and LCF 2015, EF 40% Overview:  Added automatically from request for surgery [id number]  . Cardiomyopathy (HCC) 07/19/2016   Overview:  EF 45% or 54% 01/2017  . Chest pain in adult 02/01/2017  . Chronic  bronchitis (HCC)   . COPD (chronic obstructive pulmonary disease) (HCC) 04/26/2015   PFT-06/27/2015-minimal obstructive airways disease with insignificant response to bronchodilator, minimal diffusion defect, normal lung volumes. FEV1/FVC 0.76, DLCO 72%  . Coronary artery disease   . Dyspnea on exertion 04/26/2015  . Emphysema lung (HCC)    "treated for asthma but later found out it was emphysema" (10/15/2017)  . Essential hypertension 07/19/2016  . GERD (gastroesophageal reflux disease)   . High risk medication use 07/19/2016  . Hyperlipidemia    "left lung"  . Hypertension   . Inguinal hernia 07/19/2016   Overview:  Mild bilateral.  . Lung nodule seen on imaging study 07/28/2015   chest x-ray report from 03/05/2014 at [facility] described a vague nodular density in the left upper lobe and recommended follow-up CT scan which was never done.   . LV dysfunction 05/30/2017  . MI (myocardial infarction) (HCC) 12/2013; 01/2017  . Mixed hyperlipidemia 07/19/2016  . 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 Prediabetes 07/19/2016  . Pulmonary embolism (HCC) 10/15/2017  . Stage 3 chronic kidney disease 07/19/2016  . STEMI (ST elevation myocardial infarction) [facility]) 10/18/2017   Feb 2015    Past Surgical History:  Procedure Laterality Date  . CARDIAC CATHETERIZATION  11/2016  . CATARACT EXTRACTION W/ INTRAOCULAR LENS  IMPLANT, BILATERAL Bilateral   . CORONARY ANGIOPLASTY WITH STENT PLACEMENT  12/2013-02/2014   "2 + 3 stents"  . CORONARY ANGIOPLASTY WITH STENT PLACEMENT  01/2017   distal LAD  . KNEE ARTHROSCOPY Right 1982    Current Medications: Current Meds  Medication Sig  . albuterol (PROVENTIL HFA;VENTOLIN HFA) 108 (90 Base) MCG/ACT inhaler Inhale 1 puff into the lungs every 6 (six) hours as needed for wheezing or shortness of breath.  Marland Kitchen aspirin EC 81 MG tablet Take 1 tablet (81 mg total) by mouth daily.  Marland Kitchen atorvastatin (LIPITOR) 80 MG tablet Take 80 mg by mouth every evening.   .  carvedilol (COREG) 12.5 MG tablet TAKE 1 AND 1/2 TABLETS BY MOUTH TWICE (2) DAILY  . Cyanocobalamin (B-12 PO) Take 1 tablet by mouth daily.   Marland Kitchen ELIQUIS 5 MG TABS tablet Take 1 tablet (5 mg total) by mouth 2 (two) times daily.  Marland Kitchen ENTRESTO 97-103 MG TAKE 1 TABLET BY MOUTH TWICE (2) DAILY  . mometasone-formoterol (DULERA) 200-5 MCG/ACT AERO Inhale 2 puffs into the lungs every evening.  . montelukast (SINGULAIR) 10 MG tablet Take 10 mg by mouth daily.  . nitroGLYCERIN (NITROSTAT) 0.4 MG SL tablet Place 0.4 mg under the tongue every 5 (five) minutes as needed for chest pain.  Marland Kitchen omeprazole (PRILOSEC) 40 MG capsule Take 40 mg by mouth daily.     Allergies:   Pneumovax 23 [pneumococcal vac polyvalent]   Social History   Socioeconomic History  . Marital status: Married    Spouse name: Not on file  . Number of children: Not on file  . Years of education: Not on file  . Highest education level: Not on file  Occupational History  . Not on file  Tobacco Use  . Smoking status: Former Smoker    Packs/day: 1.00    Years: 15.00  Pack years: 15.00    Types: Cigarettes    Quit date: 11/19/1976    Years since quitting: 43.0  . Smokeless tobacco: Former Systems developer    Types: Chew    Quit date: 1980  Substance and Sexual Activity  . Alcohol use: No  . Drug use: No  . Sexual activity: Not Currently  Other Topics Concern  . Not on file  Social History Narrative  . Not on file   Social Determinants of Health   Financial Resource Strain:   . Difficulty of Paying Living Expenses: Not on file  Food Insecurity:   . Worried About Charity fundraiser in the Last Year: Not on file  . Ran Out of Food in the Last Year: Not on file  Transportation Needs:   . Lack of Transportation (Medical): Not on file  . Lack of Transportation (Non-Medical): Not on file  Physical Activity:   . Days of Exercise per Week: Not on file  . Minutes of Exercise per Session: Not on file  Stress:   . Feeling of Stress : Not  on file  Social Connections:   . Frequency of Communication with Friends and Family: Not on file  . Frequency of Social Gatherings with Friends and Family: Not on file  . Attends Religious Services: Not on file  . Active Member of Clubs or Organizations: Not on file  . Attends Archivist Meetings: Not on file  . Marital Status: Not on file     Family History: The patient's family history includes Diabetes in his mother; Heart attack in his maternal grandmother and maternal uncle; Hyperlipidemia in his mother; Hypertension in his mother. ROS:   Please see the history of present illness.    All other systems reviewed and are negative.  EKGs/Labs/Other Studies Reviewed:    The following studies were reviewed today:  EKG:  EKG ordered today and personally reviewed.  The ekg ordered today demonstrates sinus rhythm old anterior septal MI otherwise normal  Recent Labs: 05/19/2019: ALT 23; BUN 20; Creatinine, Ser 1.22; Hemoglobin 14.3; Platelets 196; Potassium 3.8; Sodium 139  Recent Lipid Panel    Component Value Date/Time   CHOL 151 05/19/2019 0838   TRIG 90 05/19/2019 0838   HDL 33 (L) 05/19/2019 0838   CHOLHDL 4.6 05/19/2019 0838   LDLCALC 100 (H) 05/19/2019 2979    Physical Exam:    VS:  BP 104/60   Pulse 63   Ht 6\' 2"  (1.88 m)   Wt 190 lb 12.8 oz (86.5 kg)   SpO2 99%   BMI 24.50 kg/m     Wt Readings from Last 3 Encounters:  11/18/19 190 lb 12.8 oz (86.5 kg)  05/19/19 196 lb 1.9 oz (89 kg)  10/15/18 200 lb (90.7 kg)     GEN:  Well nourished, well developed in no acute distress HEENT: Normal NECK: No JVD; No carotid bruits LYMPHATICS: No lymphadenopathy CARDIAC: RRR, no murmurs, rubs, gallops RESPIRATORY:  Clear to auscultation without rales, wheezing or rhonchi  ABDOMEN: Soft, non-tender, non-distended MUSCULOSKELETAL:  No edema; No deformity  SKIN: Warm and dry NEUROLOGIC:  Alert and oriented x 3 PSYCHIATRIC:  Normal affect    Signed, Shirlee More, MD  11/18/2019 11:53 AM    [place]

## 2019-11-18 ENCOUNTER — Other Ambulatory Visit: Payer: Self-pay

## 2019-11-18 ENCOUNTER — Encounter: Payer: Self-pay | Admitting: Cardiology

## 2019-11-18 ENCOUNTER — Ambulatory Visit (INDEPENDENT_AMBULATORY_CARE_PROVIDER_SITE_OTHER): Payer: PPO | Admitting: Cardiology

## 2019-11-18 VITALS — BP 104/60 | HR 63 | Ht 74.0 in | Wt 190.8 lb

## 2019-11-18 DIAGNOSIS — E782 Mixed hyperlipidemia: Secondary | ICD-10-CM | POA: Diagnosis not present

## 2019-11-18 DIAGNOSIS — I10 Essential (primary) hypertension: Secondary | ICD-10-CM | POA: Diagnosis not present

## 2019-11-18 DIAGNOSIS — Z7901 Long term (current) use of anticoagulants: Secondary | ICD-10-CM

## 2019-11-18 DIAGNOSIS — J449 Chronic obstructive pulmonary disease, unspecified: Secondary | ICD-10-CM

## 2019-11-18 DIAGNOSIS — I25119 Atherosclerotic heart disease of native coronary artery with unspecified angina pectoris: Secondary | ICD-10-CM

## 2019-11-18 DIAGNOSIS — I255 Ischemic cardiomyopathy: Secondary | ICD-10-CM | POA: Diagnosis not present

## 2019-11-18 DIAGNOSIS — Z7189 Other specified counseling: Secondary | ICD-10-CM

## 2019-11-18 NOTE — Patient Instructions (Addendum)
Medication Instructions:  Your physician recommends that you continue on your current medications as directed. Please refer to the Current Medication list given to you today.  *If you need a refill on your cardiac medications before your next appointment, please call your pharmacy*  Lab Work: Your physician recommends that you return have a lipid, CMP and BNP drawn today  If you have labs (blood work) drawn today and your tests are completely normal, you will receive your results only by: Marland Kitchen MyChart Message (if you have MyChart) OR . A paper copy in the mail If you have any lab test that is abnormal or we need to change your treatment, we will call you to review the results.  Testing/Procedures: You had an EKG performed today  Follow-Up: At [facility], you and your health needs are our priority.  As part of our continuing mission to provide you with exceptional heart care, we have created designated Provider Care Teams.  These Care Teams include your primary Cardiologist (physician) and Advanced Practice Providers (APPs -  Physician Assistants and Nurse Practitioners) who all work together to provide you with the care you need, when you need it.  Your next appointment:   6 month(s)  The format for your next appointment:   In Person  Provider:   Shirlee More, MD   Purchase on line at [facility] or at [facility]

## 2019-11-19 LAB — LIPID PANEL
Chol/HDL Ratio: 3.2 ratio (ref 0.0–5.0)
Cholesterol, Total: 157 mg/dL (ref 100–199)
HDL: 49 mg/dL (ref >39)
LDL Chol Calc (NIH): 93 mg/dL (ref 0–99)
Triglycerides: 77 mg/dL (ref 0–149)
VLDL Cholesterol Cal: 15 mg/dL (ref 5–40)

## 2019-11-19 LAB — COMPREHENSIVE METABOLIC PANEL
ALT: 25 IU/L (ref 0–44)
AST: 17 IU/L (ref 0–40)
Albumin/Globulin Ratio: 1.4 (ref 1.2–2.2)
Albumin: 3.4 g/dL — ABNORMAL LOW (ref 3.8–4.8)
Alkaline Phosphatase: 131 IU/L — ABNORMAL HIGH (ref 39–117)
BUN/Creatinine Ratio: 21 (ref 10–24)
BUN: 24 mg/dL (ref 8–27)
Bilirubin Total: 0.7 mg/dL (ref 0.0–1.2)
CO2: 26 mmol/L (ref 20–29)
Calcium: 8.2 mg/dL — ABNORMAL LOW (ref 8.6–10.2)
Chloride: 103 mmol/L (ref 96–106)
Creatinine, Ser: 1.12 mg/dL (ref 0.76–1.27)
GFR calc Af Amer: 77 mL/min/{1.73_m2} (ref >59)
GFR calc non Af Amer: 67 mL/min/{1.73_m2} (ref >59)
Globulin, Total: 2.5 g/dL (ref 1.5–4.5)
Glucose: 104 mg/dL — ABNORMAL HIGH (ref 65–99)
Potassium: 4.9 mmol/L (ref 3.5–5.2)
Sodium: 139 mmol/L (ref 134–144)
Total Protein: 5.9 g/dL — ABNORMAL LOW (ref 6.0–8.5)

## 2019-11-19 LAB — PRO B NATRIURETIC PEPTIDE: NT-Pro BNP: 271 pg/mL (ref 0–376)

## 2019-11-23 ENCOUNTER — Telehealth: Payer: Self-pay | Admitting: Cardiology

## 2019-11-23 NOTE — Telephone Encounter (Signed)
States his entresto needs prior auth with his [employer]

## 2019-11-26 NOTE — Progress Notes (Signed)
[id number] - PA Case ID: [id number] - Rx #: [id number] Need help? Call us at [phone] Status Sent to [facility] Entresto 97-103MG  tablets Form Ambulance person PA Form (NCPDP)

## 2019-11-26 NOTE — Progress Notes (Signed)
Entresto prior Berkley Harvey was denied.  Please advise next step.

## 2019-11-26 NOTE — Telephone Encounter (Signed)
PA was done on 11/23/19 and denied. D. Disher called today to appeal because patient is out of Entresto. We do not have the 97-103 dose in house for samples. Appeal process takes 24 hours. RN will call again in morning to verify approval/denial of appeal. Note routed to Dr. Dulce Sellar for further advisement.

## 2020-01-18 ENCOUNTER — Telehealth: Payer: Self-pay | Admitting: *Deleted

## 2020-01-18 MED ORDER — CARVEDILOL 12.5 MG PO TABS: ORAL_TABLET | ORAL | 1 refills | Status: DC

## 2020-01-18 NOTE — Telephone Encounter (Signed)
Rx refill sent to pharmacy. 

## 2020-02-23 ENCOUNTER — Other Ambulatory Visit: Payer: Self-pay | Admitting: Cardiology

## 2020-02-24 NOTE — Telephone Encounter (Signed)
LOV with Dr. Dulce Sellar 11/18/2019 and no future appointment scheduled.

## 2020-03-03 DIAGNOSIS — J4520 Mild intermittent asthma, uncomplicated: Secondary | ICD-10-CM | POA: Diagnosis not present

## 2020-03-03 DIAGNOSIS — I129 Hypertensive chronic kidney disease with stage 1 through stage 4 chronic kidney disease, or unspecified chronic kidney disease: Secondary | ICD-10-CM | POA: Diagnosis not present

## 2020-03-03 DIAGNOSIS — Z125 Encounter for screening for malignant neoplasm of prostate: Secondary | ICD-10-CM | POA: Diagnosis not present

## 2020-03-03 DIAGNOSIS — I255 Ischemic cardiomyopathy: Secondary | ICD-10-CM | POA: Diagnosis not present

## 2020-03-03 DIAGNOSIS — R7303 Prediabetes: Secondary | ICD-10-CM | POA: Diagnosis not present

## 2020-03-03 DIAGNOSIS — K219 Gastro-esophageal reflux disease without esophagitis: Secondary | ICD-10-CM | POA: Diagnosis not present

## 2020-03-03 DIAGNOSIS — I2510 Atherosclerotic heart disease of native coronary artery without angina pectoris: Secondary | ICD-10-CM | POA: Diagnosis not present

## 2020-03-03 DIAGNOSIS — I2782 Chronic pulmonary embolism: Secondary | ICD-10-CM | POA: Diagnosis not present

## 2020-03-03 DIAGNOSIS — I10 Essential (primary) hypertension: Secondary | ICD-10-CM | POA: Diagnosis not present

## 2020-03-03 DIAGNOSIS — N1831 Chronic kidney disease, stage 3a: Secondary | ICD-10-CM | POA: Diagnosis not present

## 2020-03-03 DIAGNOSIS — E782 Mixed hyperlipidemia: Secondary | ICD-10-CM | POA: Diagnosis not present

## 2020-03-04 DIAGNOSIS — N1831 Chronic kidney disease, stage 3a: Secondary | ICD-10-CM | POA: Diagnosis not present

## 2020-05-17 NOTE — Progress Notes (Signed)
Cardiology Office Note:    Date:  05/18/2020   ID:  Derek Hart, DOB 1951-01-28, MRN [id number]  PCP:  Gordan Payment., MD  Cardiologist:  Norman Herrlich, MD    Referring MD: Gordan Payment., MD    ASSESSMENT:    1. Coronary artery disease involving native coronary artery of native heart with angina pectoris (HCC)   2. Ischemic cardiomyopathy   3. Essential hypertension   4. Mixed hyperlipidemia   5. Chronic anticoagulation    PLAN:    In order of problems listed above:  1. Stable CAD continue medical therapy including aspirin beta-blocker anticoagulant we discussed doing a myocardial perfusion study he prefers not to incentive is having more frequent or typical symptoms he contact me and we will set her up through my office. 2. Improved ejection fraction is normalized with guideline directed therapy he was transition from Entresto to valsartan by pharmacy benefit management 3. Stable continue guideline directed therapy beta-blocker valsartan 4. Lipids are ideal continue statin 5. Continue long-term anticoagulation   Next appointment: 6 months   Medication Adjustments/Labs and Tests Ordered: Current medicines are reviewed at length with the patient today.  Concerns regarding medicines are outlined above.  No orders of the defined types were placed in this encounter.  No orders of the defined types were placed in this encounter.   Chief Complaint  Patient presents with  . Follow-up  . Congestive Heart Failure  . Coronary Artery Disease  . Cardiomyopathy    History of Present Illness:    Derek Hart is a 69 y.o. male with a hx of CAD and old MI, PCI of LAD and RCA, Cir in 2015, and distal LAD 01/2017 after a NSTEMI, mild LV dysfunction with EF 45% in March 2018, dyslipidemia and COPD and  AN UNPROVOKED PULMONARY EMBOLISM WITH EF REDUCED TO 35-40% when seen 03/05/18.  He was treated medically including ARNI and subsequent echocardiogram performed 05/26/2018 showed  improvement ejection fraction 50 to 55% and then seen 10/15/2018 after recurrent unprovoked pulmonary embolism off anticoagulation at his decision.   He was last seen 11/18/2019 and remains therapeutically anticoagulated. Compliance with diet, lifestyle and medications: Yes  He is doing well rarely he will get a little bit of chest discomfort not typical angina not exertional not limiting has not needed nitroglycerin.  We discussed the merits of doing a myocardial perfusion study and he prefers not to at this time will let me know if he is having more typical infrequent symptoms.  He feels well he is not having shortness of breath edema palpitation or syncope and no change in exercise tolerance.  His benefit manager switched him from [place] to valsartan he has had no adverse consequence.  He continues anticoagulated and no bleeding complication tolerates his statin without muscle pain or weakness Past Medical History:  Diagnosis Date  . Allergic rhinitis 07/19/2016  . Asthma 07/19/2016  . Atherosclerotic heart disease of native coronary artery without angina pectoris 07/28/2015    CAD/ MI/ CABG  Overview:  [name].  MI LAD stent. 01/2017 Overview:  He had MI with staged PCI of LAD and later RCA and LCF 2015, EF 40% Overview:  Added automatically from request for surgery [id number]  . Cardiomyopathy (HCC) 07/19/2016   Overview:  EF 45% or 54% 01/2017  . Chest pain in adult 02/01/2017  . Chronic bronchitis (HCC)   . COPD (chronic obstructive pulmonary disease) (HCC) 04/26/2015   PFT-06/27/2015-minimal obstructive airways disease with insignificant response to bronchodilator,  minimal diffusion defect, normal lung volumes. FEV1/FVC 0.76, DLCO 72%  . Coronary artery disease   . Dyspnea on exertion 04/26/2015  . Emphysema lung (HCC)    "treated for asthma but later found out it was emphysema" (10/15/2017)  . Essential hypertension 07/19/2016  . GERD (gastroesophageal reflux disease)   . High risk medication use  07/19/2016  . Hyperlipidemia    "left lung"  . Hypertension   . Inguinal hernia 07/19/2016   Overview:  Mild bilateral.  . Lung nodule seen on imaging study 07/28/2015   chest x-ray report from 03/05/2014 at [facility] described a vague nodular density in the left upper lobe and recommended follow-up CT scan which was never done.   . LV dysfunction 05/30/2017  . MI (myocardial infarction) (HCC) 12/2013; 01/2017  . Mixed hyperlipidemia 07/19/2016  . 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 Prediabetes 07/19/2016  . Pulmonary embolism (HCC) 10/15/2017  . Stage 3 chronic kidney disease 07/19/2016  . STEMI (ST elevation myocardial infarction) [facility]) 10/18/2017   Feb 2015    Past Surgical History:  Procedure Laterality Date  . CARDIAC CATHETERIZATION  11/2016  . CATARACT EXTRACTION W/ INTRAOCULAR LENS  IMPLANT, BILATERAL Bilateral   . CORONARY ANGIOPLASTY WITH STENT PLACEMENT  12/2013-02/2014   "2 + 3 stents"  . CORONARY ANGIOPLASTY WITH STENT PLACEMENT  01/2017   distal LAD  . KNEE ARTHROSCOPY Right 1982    Current Medications: Current Meds  Medication Sig  . albuterol (PROVENTIL HFA;VENTOLIN HFA) 108 (90 Base) MCG/ACT inhaler Inhale 1 puff into the lungs every 6 (six) hours as needed for wheezing or shortness of breath.  Marland Kitchen aspirin EC 81 MG tablet Take 1 tablet (81 mg total) by mouth daily.  Marland Kitchen atorvastatin (LIPITOR) 80 MG tablet Take 80 mg by mouth every evening.   . carvedilol (COREG) 12.5 MG tablet TAKE 1 AND 1/2 TABLETS BY MOUTH TWICE (2) DAILY  . Cyanocobalamin (B-12 PO) Take 1 tablet by mouth daily.   Marland Kitchen ELIQUIS 5 MG TABS tablet Take 1 tablet (5 mg total) by mouth 2 (two) times daily.  . mometasone-formoterol (DULERA) 200-5 MCG/ACT AERO Inhale 2 puffs into the lungs every evening.  . montelukast (SINGULAIR) 10 MG tablet Take 10 mg by mouth daily.  . nitroGLYCERIN (NITROSTAT) 0.4 MG SL tablet Place 0.4 mg under the tongue every 5 (five) minutes as needed for chest pain.  Marland Kitchen  omeprazole (PRILOSEC) 40 MG capsule Take 40 mg by mouth daily.  . valsartan (DIOVAN) 80 MG tablet Take 80 mg by mouth daily.     Allergies:   Pneumovax 23 [pneumococcal vac polyvalent]   Social History   Socioeconomic History  . Marital status: Married    Spouse name: Not on file  . Number of children: Not on file  . Years of education: Not on file  . Highest education level: Not on file  Occupational History  . Not on file  Tobacco Use  . Smoking status: Former Smoker    Packs/day: 1.00    Years: 15.00    Pack years: 15.00    Types: Cigarettes    Quit date: 11/19/1976    Years since quitting: 43.5  . Smokeless tobacco: Former Neurosurgeon    Types: Chew    Quit date: Orthoptist  . Vaping Use: Never used  Substance and Sexual Activity  . Alcohol use: No  . Drug use: No  . Sexual activity: Not Currently  Other Topics Concern  . Not on  file  Social History Narrative  . Not on file   Social Determinants of Health   Financial Resource Strain:   . Difficulty of Paying Living Expenses:   Food Insecurity:   . Worried About Programme researcher, broadcasting/film/video in the Last Year:   . Barista in the Last Year:   Transportation Needs:   . Freight forwarder (Medical):   Marland Kitchen Lack of Transportation (Non-Medical):   Physical Activity:   . Days of Exercise per Week:   . Minutes of Exercise per Session:   Stress:   . Feeling of Stress :   Social Connections:   . Frequency of Communication with Friends and Family:   . Frequency of Social Gatherings with Friends and Family:   . Attends Religious Services:   . Active Member of Clubs or Organizations:   . Attends Banker Meetings:   Marland Kitchen Marital Status:      Family History: The patient's family history includes Diabetes in his mother; Heart attack in his maternal grandmother and maternal uncle; Hyperlipidemia in his mother; Hypertension in his mother. ROS:   Please see the history of present illness.    All other systems  reviewed and are negative.  EKGs/Labs/Other Studies Reviewed:    The following studies were reviewed today:  Recent Labs: 03/04/2020 [facility] PCP BMP normal potassium 4.4 creatinine 1.07 normal liver function CBC normal hemoglobin 14.4 Profile at target cholesterol 142 LDL 88 triglyceride 69 HDL 37 A1c 5.5% TSH normal 2.02 11/18/2019: ALT 25; BUN 24; Creatinine, Ser 1.12; NT-Pro BNP 271; Potassium 4.9; Sodium 139  Recent Lipid Panel    Component Value Date/Time   CHOL 157 11/18/2019 1158   TRIG 77 11/18/2019 1158   HDL 49 11/18/2019 1158   CHOLHDL 3.2 11/18/2019 1158   LDLCALC 93 11/18/2019 1158    Physical Exam:    VS:  BP 122/74 (BP Location: Right Arm, Patient Position: Sitting, Cuff Size: Normal)   Pulse 67   Ht 6\' 2"  (1.88 m)   Wt 192 lb 6.4 oz (87.3 kg)   SpO2 95%   BMI 24.70 kg/m     Wt Readings from Last 3 Encounters:  05/18/20 192 lb 6.4 oz (87.3 kg)  11/18/19 190 lb 12.8 oz (86.5 kg)  05/19/19 196 lb 1.9 oz (89 kg)     GEN:  Well nourished, well developed in no acute distress HEENT: Normal NECK: No JVD; No carotid bruits LYMPHATICS: No lymphadenopathy CARDIAC: RRR, no murmurs, rubs, gallops RESPIRATORY:  Clear to auscultation without rales, wheezing or rhonchi  ABDOMEN: Soft, non-tender, non-distended MUSCULOSKELETAL:  No edema; No deformity  SKIN: Warm and dry NEUROLOGIC:  Alert and oriented x 3 PSYCHIATRIC:  Normal affect    Signed, 05/21/19, MD  05/18/2020 2:40 PM    [facility]

## 2020-05-18 ENCOUNTER — Other Ambulatory Visit: Payer: Self-pay

## 2020-05-18 ENCOUNTER — Encounter: Payer: Self-pay | Admitting: Cardiology

## 2020-05-18 ENCOUNTER — Ambulatory Visit (INDEPENDENT_AMBULATORY_CARE_PROVIDER_SITE_OTHER): Payer: PPO | Admitting: Cardiology

## 2020-05-18 VITALS — BP 122/74 | HR 67 | Ht 74.0 in | Wt 192.4 lb

## 2020-05-18 DIAGNOSIS — I255 Ischemic cardiomyopathy: Secondary | ICD-10-CM

## 2020-05-18 DIAGNOSIS — I25119 Atherosclerotic heart disease of native coronary artery with unspecified angina pectoris: Secondary | ICD-10-CM

## 2020-05-18 DIAGNOSIS — I10 Essential (primary) hypertension: Secondary | ICD-10-CM

## 2020-05-18 DIAGNOSIS — E782 Mixed hyperlipidemia: Secondary | ICD-10-CM

## 2020-05-18 DIAGNOSIS — Z7901 Long term (current) use of anticoagulants: Secondary | ICD-10-CM | POA: Diagnosis not present

## 2020-05-18 NOTE — Patient Instructions (Signed)

## 2020-06-01 DIAGNOSIS — Z20828 Contact with and (suspected) exposure to other viral communicable diseases: Secondary | ICD-10-CM | POA: Diagnosis not present

## 2020-06-01 DIAGNOSIS — H9202 Otalgia, left ear: Secondary | ICD-10-CM | POA: Diagnosis not present

## 2020-06-01 DIAGNOSIS — J0100 Acute maxillary sinusitis, unspecified: Secondary | ICD-10-CM | POA: Diagnosis not present

## 2020-06-01 DIAGNOSIS — R0981 Nasal congestion: Secondary | ICD-10-CM | POA: Diagnosis not present

## 2020-06-01 DIAGNOSIS — H9201 Otalgia, right ear: Secondary | ICD-10-CM | POA: Diagnosis not present

## 2020-06-14 DIAGNOSIS — L03114 Cellulitis of left upper limb: Secondary | ICD-10-CM | POA: Diagnosis not present

## 2020-07-18 DIAGNOSIS — S61412A Laceration without foreign body of left hand, initial encounter: Secondary | ICD-10-CM | POA: Diagnosis not present

## 2020-07-22 DIAGNOSIS — S60512A Abrasion of left hand, initial encounter: Secondary | ICD-10-CM | POA: Diagnosis not present

## 2020-08-16 ENCOUNTER — Other Ambulatory Visit: Payer: Self-pay | Admitting: Cardiology

## 2020-09-05 DIAGNOSIS — E538 Deficiency of other specified B group vitamins: Secondary | ICD-10-CM | POA: Diagnosis not present

## 2020-09-05 DIAGNOSIS — Z0000 Encounter for general adult medical examination without abnormal findings: Secondary | ICD-10-CM | POA: Diagnosis not present

## 2020-09-05 DIAGNOSIS — K4020 Bilateral inguinal hernia, without obstruction or gangrene, not specified as recurrent: Secondary | ICD-10-CM | POA: Diagnosis not present

## 2020-09-05 DIAGNOSIS — I10 Essential (primary) hypertension: Secondary | ICD-10-CM | POA: Diagnosis not present

## 2020-09-05 DIAGNOSIS — E782 Mixed hyperlipidemia: Secondary | ICD-10-CM | POA: Diagnosis not present

## 2020-09-05 DIAGNOSIS — J4520 Mild intermittent asthma, uncomplicated: Secondary | ICD-10-CM | POA: Diagnosis not present

## 2020-09-05 DIAGNOSIS — I255 Ischemic cardiomyopathy: Secondary | ICD-10-CM | POA: Diagnosis not present

## 2020-09-05 DIAGNOSIS — I119 Hypertensive heart disease without heart failure: Secondary | ICD-10-CM | POA: Diagnosis not present

## 2020-09-05 DIAGNOSIS — I2510 Atherosclerotic heart disease of native coronary artery without angina pectoris: Secondary | ICD-10-CM | POA: Diagnosis not present

## 2020-09-05 DIAGNOSIS — R7303 Prediabetes: Secondary | ICD-10-CM | POA: Diagnosis not present

## 2020-10-10 ENCOUNTER — Telehealth: Payer: Self-pay | Admitting: Cardiology

## 2020-10-10 NOTE — Telephone Encounter (Signed)
Spoke to the patient just now and he let me know that he thinks his insurance company is going to deny his Eliquis for next year. He states that he is going to pick up this medication from the pharmacy this week. I requested that he let us know what needed to be done for this. If we need to fill out forms for the insurance company to cover this I have requested that he find this out and have them fax [place] the forms. If he needs to apply for patient assistance I told him we would be happy to get these forms filled out for him as well. He states that he will call back to let us know.    Encouraged patient to call back with any questions or concerns.

## 2020-10-10 NOTE — Telephone Encounter (Signed)
Patient calling back in to ask that we fax a prior authorization to [phone] - states that that is what they are saying they need.

## 2020-10-10 NOTE — Telephone Encounter (Signed)
Pt c/o medication issue:  1. Name of Medication:  ELIQUIS 5 MG TABS tablet  2. How are you currently taking this medication (dosage and times per day)?  As directed   3. Are you having a reaction (difficulty breathing--STAT)?  no  4. What is your medication issue?   Patient states that his health plan has denied ELIQUIS for the future and he would like to know what Dr. Dulce Sellar can do to help with this. Please call/advise  Thank you!

## 2020-10-24 ENCOUNTER — Emergency Department (HOSPITAL_COMMUNITY): Payer: PPO

## 2020-10-24 ENCOUNTER — Encounter (HOSPITAL_COMMUNITY): Payer: Self-pay | Admitting: Emergency Medicine

## 2020-10-24 ENCOUNTER — Inpatient Hospital Stay (HOSPITAL_COMMUNITY)
Admission: EM | Admit: 2020-10-24 | Discharge: 2020-10-26 | DRG: 246 | Disposition: A | Discharge status: Home / Self Care | Payer: PPO | Attending: Internal Medicine | Admitting: Internal Medicine

## 2020-10-24 ENCOUNTER — Other Ambulatory Visit: Payer: Self-pay

## 2020-10-24 DIAGNOSIS — I130 Hypertensive heart and chronic kidney disease with heart failure and stage 1 through stage 4 chronic kidney disease, or unspecified chronic kidney disease: Secondary | ICD-10-CM | POA: Diagnosis present

## 2020-10-24 DIAGNOSIS — R079 Chest pain, unspecified: Secondary | ICD-10-CM | POA: Diagnosis present

## 2020-10-24 DIAGNOSIS — Z86711 Personal history of pulmonary embolism: Secondary | ICD-10-CM | POA: Diagnosis not present

## 2020-10-24 DIAGNOSIS — Z7982 Long term (current) use of aspirin: Secondary | ICD-10-CM

## 2020-10-24 DIAGNOSIS — Z887 Allergy status to serum and vaccine status: Secondary | ICD-10-CM

## 2020-10-24 DIAGNOSIS — N183 Chronic kidney disease, stage 3 unspecified: Secondary | ICD-10-CM | POA: Diagnosis present

## 2020-10-24 DIAGNOSIS — J449 Chronic obstructive pulmonary disease, unspecified: Secondary | ICD-10-CM | POA: Diagnosis present

## 2020-10-24 DIAGNOSIS — Z955 Presence of coronary angioplasty implant and graft: Secondary | ICD-10-CM

## 2020-10-24 DIAGNOSIS — Z87891 Personal history of nicotine dependence: Secondary | ICD-10-CM | POA: Diagnosis not present

## 2020-10-24 DIAGNOSIS — Y713 Surgical instruments, materials and cardiovascular devices (including sutures) associated with adverse incidents: Secondary | ICD-10-CM | POA: Diagnosis present

## 2020-10-24 DIAGNOSIS — Z83438 Family history of other disorder of lipoprotein metabolism and other lipidemia: Secondary | ICD-10-CM | POA: Diagnosis not present

## 2020-10-24 DIAGNOSIS — K219 Gastro-esophageal reflux disease without esophagitis: Secondary | ICD-10-CM | POA: Diagnosis present

## 2020-10-24 DIAGNOSIS — I5032 Chronic diastolic (congestive) heart failure: Secondary | ICD-10-CM | POA: Diagnosis present

## 2020-10-24 DIAGNOSIS — Z20822 Contact with and (suspected) exposure to covid-19: Secondary | ICD-10-CM | POA: Diagnosis present

## 2020-10-24 DIAGNOSIS — I429 Cardiomyopathy, unspecified: Secondary | ICD-10-CM | POA: Diagnosis present

## 2020-10-24 DIAGNOSIS — I252 Old myocardial infarction: Secondary | ICD-10-CM | POA: Diagnosis not present

## 2020-10-24 DIAGNOSIS — T82855A Stenosis of coronary artery stent, initial encounter: Secondary | ICD-10-CM | POA: Diagnosis present

## 2020-10-24 DIAGNOSIS — I214 Non-ST elevation (NSTEMI) myocardial infarction: Secondary | ICD-10-CM | POA: Diagnosis not present

## 2020-10-24 DIAGNOSIS — I200 Unstable angina: Secondary | ICD-10-CM | POA: Diagnosis present

## 2020-10-24 DIAGNOSIS — Z79899 Other long term (current) drug therapy: Secondary | ICD-10-CM

## 2020-10-24 DIAGNOSIS — I21A1 Myocardial infarction type 2: Secondary | ICD-10-CM | POA: Diagnosis present

## 2020-10-24 DIAGNOSIS — I2510 Atherosclerotic heart disease of native coronary artery without angina pectoris: Secondary | ICD-10-CM | POA: Diagnosis not present

## 2020-10-24 DIAGNOSIS — Z7901 Long term (current) use of anticoagulants: Secondary | ICD-10-CM | POA: Diagnosis not present

## 2020-10-24 DIAGNOSIS — E782 Mixed hyperlipidemia: Secondary | ICD-10-CM | POA: Diagnosis present

## 2020-10-24 DIAGNOSIS — Z833 Family history of diabetes mellitus: Secondary | ICD-10-CM | POA: Diagnosis not present

## 2020-10-24 DIAGNOSIS — I25110 Atherosclerotic heart disease of native coronary artery with unstable angina pectoris: Secondary | ICD-10-CM | POA: Diagnosis present

## 2020-10-24 DIAGNOSIS — I10 Essential (primary) hypertension: Secondary | ICD-10-CM | POA: Diagnosis not present

## 2020-10-24 DIAGNOSIS — Z8249 Family history of ischemic heart disease and other diseases of the circulatory system: Secondary | ICD-10-CM | POA: Diagnosis not present

## 2020-10-24 LAB — CBC
HCT: 41.9 % (ref 39.0–52.0)
Hemoglobin: 13.7 g/dL (ref 13.0–17.0)
MCH: 30.4 pg (ref 26.0–34.0)
MCHC: 32.7 g/dL (ref 30.0–36.0)
MCV: 93.1 fL (ref 80.0–100.0)
Platelets: 194 10*3/uL (ref 150–400)
RBC: 4.5 MIL/uL (ref 4.22–5.81)
RDW: 13.2 % (ref 11.5–15.5)
WBC: 7.2 10*3/uL (ref 4.0–10.5)
nRBC: 0 % (ref 0.0–0.2)

## 2020-10-24 LAB — BASIC METABOLIC PANEL
Anion gap: 9 (ref 5–15)
BUN: 18 mg/dL (ref 8–23)
CO2: 25 mmol/L (ref 22–32)
Calcium: 9.1 mg/dL (ref 8.9–10.3)
Chloride: 107 mmol/L (ref 98–111)
Creatinine, Ser: 1.21 mg/dL (ref 0.61–1.24)
GFR, Estimated: >60 mL/min (ref >60)
Glucose, Bld: 108 mg/dL — ABNORMAL HIGH (ref 70–99)
Potassium: 4.1 mmol/L (ref 3.5–5.1)
Sodium: 141 mmol/L (ref 135–145)

## 2020-10-24 LAB — TROPONIN I (HIGH SENSITIVITY): Troponin I (High Sensitivity): 37 ng/L — ABNORMAL HIGH (ref <18)

## 2020-10-24 NOTE — ED Triage Notes (Signed)
Pt c/o left side cp for the past few days getting worse today, denies any SOB, nausea or vomiting.

## 2020-10-25 ENCOUNTER — Encounter (HOSPITAL_COMMUNITY): Payer: Self-pay | Admitting: Internal Medicine

## 2020-10-25 ENCOUNTER — Encounter (HOSPITAL_COMMUNITY)
Admission: EM | Disposition: A | Discharge status: Home / Self Care | Payer: Self-pay | Source: Home / Self Care | Attending: Internal Medicine

## 2020-10-25 DIAGNOSIS — Z8249 Family history of ischemic heart disease and other diseases of the circulatory system: Secondary | ICD-10-CM | POA: Diagnosis not present

## 2020-10-25 DIAGNOSIS — E782 Mixed hyperlipidemia: Secondary | ICD-10-CM

## 2020-10-25 DIAGNOSIS — Z87891 Personal history of nicotine dependence: Secondary | ICD-10-CM | POA: Diagnosis not present

## 2020-10-25 DIAGNOSIS — Z7982 Long term (current) use of aspirin: Secondary | ICD-10-CM | POA: Diagnosis not present

## 2020-10-25 DIAGNOSIS — J449 Chronic obstructive pulmonary disease, unspecified: Secondary | ICD-10-CM

## 2020-10-25 DIAGNOSIS — Y713 Surgical instruments, materials and cardiovascular devices (including sutures) associated with adverse incidents: Secondary | ICD-10-CM | POA: Diagnosis present

## 2020-10-25 DIAGNOSIS — I10 Essential (primary) hypertension: Secondary | ICD-10-CM

## 2020-10-25 DIAGNOSIS — R079 Chest pain, unspecified: Secondary | ICD-10-CM

## 2020-10-25 DIAGNOSIS — I214 Non-ST elevation (NSTEMI) myocardial infarction: Secondary | ICD-10-CM

## 2020-10-25 DIAGNOSIS — I252 Old myocardial infarction: Secondary | ICD-10-CM | POA: Diagnosis not present

## 2020-10-25 DIAGNOSIS — Z7901 Long term (current) use of anticoagulants: Secondary | ICD-10-CM | POA: Diagnosis not present

## 2020-10-25 DIAGNOSIS — Z955 Presence of coronary angioplasty implant and graft: Secondary | ICD-10-CM | POA: Diagnosis not present

## 2020-10-25 DIAGNOSIS — I25110 Atherosclerotic heart disease of native coronary artery with unstable angina pectoris: Secondary | ICD-10-CM | POA: Diagnosis present

## 2020-10-25 DIAGNOSIS — Z86711 Personal history of pulmonary embolism: Secondary | ICD-10-CM | POA: Diagnosis not present

## 2020-10-25 DIAGNOSIS — I130 Hypertensive heart and chronic kidney disease with heart failure and stage 1 through stage 4 chronic kidney disease, or unspecified chronic kidney disease: Secondary | ICD-10-CM | POA: Diagnosis present

## 2020-10-25 DIAGNOSIS — Z833 Family history of diabetes mellitus: Secondary | ICD-10-CM | POA: Diagnosis not present

## 2020-10-25 DIAGNOSIS — K219 Gastro-esophageal reflux disease without esophagitis: Secondary | ICD-10-CM | POA: Diagnosis present

## 2020-10-25 DIAGNOSIS — Z20822 Contact with and (suspected) exposure to covid-19: Secondary | ICD-10-CM | POA: Diagnosis present

## 2020-10-25 DIAGNOSIS — I5032 Chronic diastolic (congestive) heart failure: Secondary | ICD-10-CM | POA: Diagnosis present

## 2020-10-25 DIAGNOSIS — T82855A Stenosis of coronary artery stent, initial encounter: Secondary | ICD-10-CM | POA: Diagnosis present

## 2020-10-25 DIAGNOSIS — I429 Cardiomyopathy, unspecified: Secondary | ICD-10-CM | POA: Diagnosis present

## 2020-10-25 DIAGNOSIS — I2510 Atherosclerotic heart disease of native coronary artery without angina pectoris: Secondary | ICD-10-CM | POA: Diagnosis not present

## 2020-10-25 DIAGNOSIS — Z79899 Other long term (current) drug therapy: Secondary | ICD-10-CM | POA: Diagnosis not present

## 2020-10-25 DIAGNOSIS — Z887 Allergy status to serum and vaccine status: Secondary | ICD-10-CM | POA: Diagnosis not present

## 2020-10-25 DIAGNOSIS — Z83438 Family history of other disorder of lipoprotein metabolism and other lipidemia: Secondary | ICD-10-CM | POA: Diagnosis not present

## 2020-10-25 DIAGNOSIS — N183 Chronic kidney disease, stage 3 unspecified: Secondary | ICD-10-CM | POA: Diagnosis present

## 2020-10-25 DIAGNOSIS — I21A1 Myocardial infarction type 2: Secondary | ICD-10-CM | POA: Diagnosis present

## 2020-10-25 DIAGNOSIS — I200 Unstable angina: Secondary | ICD-10-CM | POA: Diagnosis present

## 2020-10-25 HISTORY — PX: CORONARY STENT INTERVENTION: CATH118234

## 2020-10-25 HISTORY — PX: LEFT HEART CATH AND CORONARY ANGIOGRAPHY: CATH118249

## 2020-10-25 HISTORY — DX: Chest pain, unspecified: R07.9

## 2020-10-25 HISTORY — PX: INTRAVASCULAR ULTRASOUND/IVUS: CATH118244

## 2020-10-25 HISTORY — DX: Non-ST elevation (NSTEMI) myocardial infarction: I21.4

## 2020-10-25 LAB — COMPREHENSIVE METABOLIC PANEL
ALT: 24 U/L (ref 0–44)
AST: 20 U/L (ref 15–41)
Albumin: 3.3 g/dL — ABNORMAL LOW (ref 3.5–5.0)
Alkaline Phosphatase: 97 U/L (ref 38–126)
Anion gap: 10 (ref 5–15)
BUN: 20 mg/dL (ref 8–23)
CO2: 24 mmol/L (ref 22–32)
Calcium: 9 mg/dL (ref 8.9–10.3)
Chloride: 108 mmol/L (ref 98–111)
Creatinine, Ser: 1.16 mg/dL (ref 0.61–1.24)
GFR, Estimated: >60 mL/min (ref >60)
Glucose, Bld: 106 mg/dL — ABNORMAL HIGH (ref 70–99)
Potassium: 3.7 mmol/L (ref 3.5–5.1)
Sodium: 142 mmol/L (ref 135–145)
Total Bilirubin: 0.7 mg/dL (ref 0.3–1.2)
Total Protein: 5.8 g/dL — ABNORMAL LOW (ref 6.5–8.1)

## 2020-10-25 LAB — MAGNESIUM: Magnesium: 1.8 mg/dL (ref 1.7–2.4)

## 2020-10-25 LAB — RESP PANEL BY RT-PCR (FLU A&B, COVID) ARPGX2
Influenza A by PCR: NEGATIVE
Influenza B by PCR: NEGATIVE
SARS Coronavirus 2 by RT PCR: NEGATIVE

## 2020-10-25 LAB — HIV ANTIBODY (ROUTINE TESTING W REFLEX): HIV Screen 4th Generation wRfx: NONREACTIVE

## 2020-10-25 LAB — CBC
HCT: 39.5 % (ref 39.0–52.0)
Hemoglobin: 12.9 g/dL — ABNORMAL LOW (ref 13.0–17.0)
MCH: 29.9 pg (ref 26.0–34.0)
MCHC: 32.7 g/dL (ref 30.0–36.0)
MCV: 91.6 fL (ref 80.0–100.0)
Platelets: 176 10*3/uL (ref 150–400)
RBC: 4.31 MIL/uL (ref 4.22–5.81)
RDW: 13.3 % (ref 11.5–15.5)
WBC: 7 10*3/uL (ref 4.0–10.5)
nRBC: 0 % (ref 0.0–0.2)

## 2020-10-25 LAB — TROPONIN I (HIGH SENSITIVITY)
Troponin I (High Sensitivity): 94 ng/L — ABNORMAL HIGH (ref <18)
Troponin I (High Sensitivity): 159 ng/L (ref <18)

## 2020-10-25 LAB — APTT: aPTT: 65 seconds — ABNORMAL HIGH (ref 24–36)

## 2020-10-25 LAB — D-DIMER, QUANTITATIVE: D-Dimer, Quant: 0.3 ug/mL-FEU (ref 0.00–0.50)

## 2020-10-25 LAB — POCT ACTIVATED CLOTTING TIME: Activated Clotting Time: 249 seconds

## 2020-10-25 LAB — HEPARIN LEVEL (UNFRACTIONATED): Heparin Unfractionated: 0.99 IU/mL — ABNORMAL HIGH (ref 0.30–0.70)

## 2020-10-25 SURGERY — LEFT HEART CATH AND CORONARY ANGIOGRAPHY
Anesthesia: LOCAL

## 2020-10-25 MED ORDER — SODIUM CHLORIDE 0.9 % IV SOLN: 250.0000 mL | INTRAVENOUS | Status: DC | PRN

## 2020-10-25 MED ORDER — SODIUM CHLORIDE 0.9 % WEIGHT BASED INFUSION
1.0000 mL/kg/h | INTRAVENOUS | Status: DC
  Administered 2020-10-25: 1 mL/kg/h via INTRAVENOUS

## 2020-10-25 MED ORDER — SODIUM CHLORIDE 0.9% FLUSH
3.0000 mL | Freq: Two times a day (BID) | INTRAVENOUS | Status: DC
  Administered 2020-10-25: 3 mL via INTRAVENOUS

## 2020-10-25 MED ORDER — FENTANYL CITRATE (PF) 100 MCG/2ML IJ SOLN
INTRAMUSCULAR | Status: AC
  Filled 2020-10-25: qty 2

## 2020-10-25 MED ORDER — IRBESARTAN 75 MG PO TABS
75.0000 mg | ORAL_TABLET | Freq: Every day | ORAL | Status: DC
  Administered 2020-10-25 – 2020-10-26 (×2): 75 mg via ORAL
  Filled 2020-10-25 (×2): qty 1

## 2020-10-25 MED ORDER — ASPIRIN 81 MG PO CHEW
81.0000 mg | CHEWABLE_TABLET | Freq: Every day | ORAL | Status: DC
  Administered 2020-10-26: 81 mg via ORAL
  Filled 2020-10-25: qty 1

## 2020-10-25 MED ORDER — FENTANYL CITRATE (PF) 100 MCG/2ML IJ SOLN
INTRAMUSCULAR | Status: DC | PRN
  Administered 2020-10-25: 25 ug via INTRAVENOUS

## 2020-10-25 MED ORDER — ASPIRIN 81 MG PO CHEW: 81.0000 mg | CHEWABLE_TABLET | ORAL | Status: DC

## 2020-10-25 MED ORDER — LIDOCAINE HCL (PF) 1 % IJ SOLN
INTRAMUSCULAR | Status: AC
  Filled 2020-10-25: qty 30

## 2020-10-25 MED ORDER — SODIUM CHLORIDE 0.9% FLUSH
3.0000 mL | Freq: Two times a day (BID) | INTRAVENOUS | Status: DC
  Administered 2020-10-26: 3 mL via INTRAVENOUS

## 2020-10-25 MED ORDER — SODIUM CHLORIDE 0.9 % WEIGHT BASED INFUSION: 3.0000 mL/kg/h | INTRAVENOUS | Status: DC

## 2020-10-25 MED ORDER — NITROGLYCERIN 2 % TD OINT
1.0000 [in_us] | TOPICAL_OINTMENT | Freq: Once | TRANSDERMAL | Status: AC
  Administered 2020-10-25: 1 [in_us] via TOPICAL
  Filled 2020-10-25: qty 1

## 2020-10-25 MED ORDER — MIDAZOLAM HCL 2 MG/2ML IJ SOLN
INTRAMUSCULAR | Status: DC | PRN
  Administered 2020-10-25: 1 mg via INTRAVENOUS

## 2020-10-25 MED ORDER — SODIUM CHLORIDE 0.9% FLUSH: 3.0000 mL | INTRAVENOUS | Status: DC | PRN

## 2020-10-25 MED ORDER — VERAPAMIL HCL 2.5 MG/ML IV SOLN: INTRAVENOUS | Status: DC | PRN

## 2020-10-25 MED ORDER — MONTELUKAST SODIUM 10 MG PO TABS
10.0000 mg | ORAL_TABLET | Freq: Every day | ORAL | Status: DC
  Administered 2020-10-25 – 2020-10-26 (×2): 10 mg via ORAL
  Filled 2020-10-25 (×2): qty 1

## 2020-10-25 MED ORDER — VERAPAMIL HCL 2.5 MG/ML IV SOLN
INTRAVENOUS | Status: AC
  Filled 2020-10-25: qty 2

## 2020-10-25 MED ORDER — LIDOCAINE HCL (PF) 1 % IJ SOLN
INTRAMUSCULAR | Status: DC | PRN
  Administered 2020-10-25: 2 mL

## 2020-10-25 MED ORDER — ASPIRIN 81 MG PO CHEW
81.0000 mg | CHEWABLE_TABLET | ORAL | Status: AC
  Administered 2020-10-25: 81 mg via ORAL
  Filled 2020-10-25: qty 1

## 2020-10-25 MED ORDER — ASPIRIN 81 MG PO CHEW
324.0000 mg | CHEWABLE_TABLET | Freq: Once | ORAL | Status: AC
  Administered 2020-10-25: 324 mg via ORAL
  Filled 2020-10-25: qty 4

## 2020-10-25 MED ORDER — FAMOTIDINE IN NACL 20-0.9 MG/50ML-% IV SOLN
INTRAVENOUS | Status: AC
  Filled 2020-10-25: qty 50

## 2020-10-25 MED ORDER — FAMOTIDINE IN NACL 20-0.9 MG/50ML-% IV SOLN
20.0000 mg | Freq: Once | INTRAVENOUS | Status: AC
  Administered 2020-10-25: 20 mg via INTRAVENOUS

## 2020-10-25 MED ORDER — HEPARIN SODIUM (PORCINE) 1000 UNIT/ML IJ SOLN
INTRAMUSCULAR | Status: DC | PRN
  Administered 2020-10-25: 4000 [IU] via INTRAVENOUS
  Administered 2020-10-25: 3000 [IU] via INTRAVENOUS
  Administered 2020-10-25: 4000 [IU] via INTRAVENOUS

## 2020-10-25 MED ORDER — CLOPIDOGREL BISULFATE 300 MG PO TABS
ORAL_TABLET | ORAL | Status: AC
  Filled 2020-10-25: qty 1

## 2020-10-25 MED ORDER — ALBUTEROL SULFATE HFA 108 (90 BASE) MCG/ACT IN AERS: 1.0000 | INHALATION_SPRAY | Freq: Four times a day (QID) | RESPIRATORY_TRACT | Status: DC | PRN

## 2020-10-25 MED ORDER — HEPARIN (PORCINE) 25000 UT/250ML-% IV SOLN
1600.0000 [IU]/h | INTRAVENOUS | Status: DC
  Administered 2020-10-25: 1500 [IU]/h via INTRAVENOUS
  Filled 2020-10-25: qty 250

## 2020-10-25 MED ORDER — CLOPIDOGREL BISULFATE 300 MG PO TABS
ORAL_TABLET | ORAL | Status: DC | PRN
  Administered 2020-10-25: 600 mg via ORAL

## 2020-10-25 MED ORDER — HEPARIN SODIUM (PORCINE) 1000 UNIT/ML IJ SOLN
INTRAMUSCULAR | Status: AC
  Filled 2020-10-25: qty 1

## 2020-10-25 MED ORDER — HEPARIN (PORCINE) IN NACL 1000-0.9 UT/500ML-% IV SOLN
INTRAVENOUS | Status: DC | PRN
  Administered 2020-10-25 (×2): 500 mL

## 2020-10-25 MED ORDER — MIDAZOLAM HCL 2 MG/2ML IJ SOLN
INTRAMUSCULAR | Status: AC
  Filled 2020-10-25: qty 2

## 2020-10-25 MED ORDER — HYDRALAZINE HCL 20 MG/ML IJ SOLN: 10.0000 mg | INTRAMUSCULAR | Status: AC | PRN

## 2020-10-25 MED ORDER — HEPARIN (PORCINE) IN NACL 1000-0.9 UT/500ML-% IV SOLN
INTRAVENOUS | Status: AC
  Filled 2020-10-25: qty 1000

## 2020-10-25 MED ORDER — IOHEXOL 350 MG/ML SOLN
INTRAVENOUS | Status: DC | PRN
  Administered 2020-10-25: 100 mL

## 2020-10-25 MED ORDER — SODIUM CHLORIDE 0.9 % WEIGHT BASED INFUSION: 1.0000 mL/kg/h | INTRAVENOUS | Status: AC

## 2020-10-25 MED ORDER — NITROGLYCERIN 0.4 MG SL SUBL: 0.4000 mg | SUBLINGUAL_TABLET | Freq: Once | SUBLINGUAL | Status: DC

## 2020-10-25 MED ORDER — NITROGLYCERIN 0.4 MG SL SUBL: 0.4000 mg | SUBLINGUAL_TABLET | SUBLINGUAL | Status: DC | PRN

## 2020-10-25 MED ORDER — MOMETASONE FURO-FORMOTEROL FUM 200-5 MCG/ACT IN AERO
2.0000 | INHALATION_SPRAY | Freq: Every evening | RESPIRATORY_TRACT | Status: DC
  Filled 2020-10-25: qty 8.8

## 2020-10-25 MED ORDER — ATORVASTATIN CALCIUM 80 MG PO TABS
80.0000 mg | ORAL_TABLET | Freq: Every evening | ORAL | Status: DC
  Filled 2020-10-25: qty 1

## 2020-10-25 MED ORDER — CLOPIDOGREL BISULFATE 75 MG PO TABS
75.0000 mg | ORAL_TABLET | Freq: Every day | ORAL | Status: DC
  Administered 2020-10-26: 75 mg via ORAL
  Filled 2020-10-25: qty 1

## 2020-10-25 MED ORDER — CARVEDILOL 12.5 MG PO TABS
12.5000 mg | ORAL_TABLET | Freq: Two times a day (BID) | ORAL | Status: DC
  Administered 2020-10-25 – 2020-10-26 (×2): 12.5 mg via ORAL
  Filled 2020-10-25: qty 1
  Filled 2020-10-25: qty 4

## 2020-10-25 MED ORDER — SODIUM CHLORIDE 0.9 % WEIGHT BASED INFUSION: 1.0000 mL/kg/h | INTRAVENOUS | Status: DC

## 2020-10-25 SURGICAL SUPPLY — 21 items
BALLN EMERGE MR 2.5X12 (BALLOONS) ×2
BALLN ~~LOC~~ EMERGE MR 3.75X12 (BALLOONS) ×2
BALLOON EMERGE MR 2.5X12 (BALLOONS) IMPLANT
BALLOON ~~LOC~~ EMERGE MR 3.75X12 (BALLOONS) IMPLANT
CATH 5FR JL3.5 JR4 ANG PIG MP (CATHETERS) ×1 IMPLANT
CATH OPTICROSS HD (CATHETERS) ×1 IMPLANT
CATH VISTA GUIDE 6FR XBLAD3.5 (CATHETERS) ×1 IMPLANT
DEVICE RAD COMP TR BAND LRG (VASCULAR PRODUCTS) ×1 IMPLANT
GLIDESHEATH SLEND SS 6F .021 (SHEATH) ×1 IMPLANT
GUIDEWIRE INQWIRE 1.5J.035X260 (WIRE) IMPLANT
INQWIRE 1.5J .035X260CM (WIRE) ×2
KIT ENCORE 26 ADVANTAGE (KITS) ×1 IMPLANT
KIT HEART LEFT (KITS) ×2 IMPLANT
PACK CARDIAC CATHETERIZATION (CUSTOM PROCEDURE TRAY) ×2 IMPLANT
SLED PULL BACK IVUS (MISCELLANEOUS) ×1 IMPLANT
STENT RESOLUTE ONYX 3.5X15 (Permanent Stent) ×1 IMPLANT
TRANSDUCER W/STOPCOCK (MISCELLANEOUS) ×2 IMPLANT
TUBING CIL FLEX 10 FLL-RA (TUBING) ×2 IMPLANT
WIRE ASAHI PROWATER 180CM (WIRE) ×1 IMPLANT
WIRE HI TORQ VERSACORE-J 145CM (WIRE) ×1 IMPLANT
WIRE PT2 MS 185 (WIRE) ×1 IMPLANT

## 2020-10-25 NOTE — ED Notes (Signed)
RN notified about vitals 

## 2020-10-25 NOTE — Progress Notes (Signed)
ANTICOAGULATION CONSULT NOTE - Initial Consult  Pharmacy Consult for Heparin (Apixaban on hold) Indication: History of PE, r/o ACS  Allergies  Allergen Reactions  . Pneumovax 23 [Pneumococcal Vac Polyvalent] Rash     Vital Signs: Temp: 97.9 F (36.6 C) (12/07 0206) Temp Source: Oral (12/06 1805) BP: 148/94 (12/07 0206) Pulse Rate: 62 (12/07 0206)  Labs: Recent Labs    10/24/20 1811 10/24/20 2253  HGB 13.7  --   HCT 41.9  --   PLT 194  --   CREATININE 1.21  --   TROPONINIHS 37* 94*    CrCl cannot be calculated (Unknown ideal weight.).   Medical History: Past Medical History:  Diagnosis Date  . Allergic rhinitis 07/19/2016  . Asthma 07/19/2016  . Atherosclerotic heart disease of native coronary artery without angina pectoris 07/28/2015    CAD/ MI/ CABG  Overview:  [place].  MI LAD stent. 01/2017 Overview:  He had MI with staged PCI of LAD and later RCA and LCF 2015, EF 40% Overview:  Added automatically from request for surgery [id number]  . Cardiomyopathy (HCC) 07/19/2016   Overview:  EF 45% or 54% 01/2017  . Chest pain in adult 02/01/2017  . Chronic bronchitis (HCC)   . COPD (chronic obstructive pulmonary disease) (HCC) 04/26/2015   PFT-06/27/2015-minimal obstructive airways disease with insignificant response to bronchodilator, minimal diffusion defect, normal lung volumes. FEV1/FVC 0.76, DLCO 72%  . Coronary artery disease   . Dyspnea on exertion 04/26/2015  . Emphysema lung (HCC)    "treated for asthma but later found out it was emphysema" (10/15/2017)  . Essential hypertension 07/19/2016  . GERD (gastroesophageal reflux disease)   . High risk medication use 07/19/2016  . Hyperlipidemia    "left lung"  . Hypertension   . Inguinal hernia 07/19/2016   Overview:  Mild bilateral.  . Lung nodule seen on imaging study 07/28/2015   chest x-ray report from 03/05/2014 at [facility] described a vague nodular density in the left upper lobe and recommended follow-up CT scan which  was never done.   . LV dysfunction 05/30/2017  . MI (myocardial infarction) (HCC) 12/2013; 01/2017  . Mixed hyperlipidemia 07/19/2016  . 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 Prediabetes 07/19/2016  . Pulmonary embolism (HCC) 10/15/2017  . Stage 3 chronic kidney disease (HCC) 07/19/2016  . STEMI (ST elevation myocardial infarction) [facility]) 10/18/2017   Feb 2015      Assessment: 69 y/o M on apixaban PTA for hx of PE presents to the ED with chest pain. Holding apixaban for now and starting heparin for r/o ACS. It has been >12 hours since last apixaban dose, will start heparin now. Anticipate using aPTT to dose for now.   Goal of Therapy:  Heparin level 0.3-0.7 units/ml aPTT 66-102 seconds Monitor platelets by anticoagulation protocol: Yes   Plan:  Start heparin drip at 1500 units/hr 1200 aPTT/HL Daily CBC/aPTT/HL Monitor for bleeding  Abran Duke, PharmD, BCPS Clinical Pharmacist Phone: [phone]

## 2020-10-25 NOTE — H&P (View-Only) (Signed)
Cardiology Consultation:   Patient ID: Derek Hart MRN: [id number]; DOB: 1951-11-06  Admit date: 10/24/2020 Date of Consult: 10/25/2020  Primary Care Provider: Gordan Payment., MD [facility] Cardiologist: Norman Herrlich, MD    Patient Profile:   Derek Hart is a 69 y.o. male with a hx of coronary artery disease, previous pulmonary embolus, hyperlipidemia, hypertension, COPD for evaluation of possible non-ST elevation myocardial infarction at the request of Noralee Stain DO.  History of Present Illness:   Most recent echocardiogram November 2019 showed ejection fraction 50 to 55% with akinesis of the apical myocardium, grade 1 diastolic dysfunction and mild left atrial enlargement.  Patient has had previous PCI of LAD and right coronary artery.  Cardiac catheterization March 2018 at [facility] showed occluded LAD which was treated with drug-eluting stent, 50% OM1, 30% right coronary artery and 60% PDA.  Also with history of unprovoked pulmonary embolus and is on chronic anticoagulation.  Patient has done well since his most recent PCI until 3 days ago.  He noticed a discomfort in his chest with more vigorous activities relieved with rest.  He states it was like "breathing in cold air".  Last evening he developed the same symptoms after dinner that would not resolve.  He presented to the emergency room and was admitted and cardiology now asked to evaluate.  Note the pain did not radiate.  It was not pleuritic, positional and there were no associated symptoms.  Similar to previous cardiac pain but less severe.   Past Medical History:  Diagnosis Date  . Allergic rhinitis 07/19/2016  . Asthma 07/19/2016  . Atherosclerotic heart disease of native coronary artery without angina pectoris 07/28/2015    CAD/ MI/ CABG  Overview:  [place].  MI LAD stent. 01/2017 Overview:  He had MI with staged PCI of LAD and later RCA and LCF 2015, EF 40% Overview:  Added automatically from request for surgery [id number]  .  Cardiomyopathy (HCC) 07/19/2016   Overview:  EF 45% or 54% 01/2017  . Chronic bronchitis (HCC)   . COPD (chronic obstructive pulmonary disease) (HCC) 04/26/2015   PFT-06/27/2015-minimal obstructive airways disease with insignificant response to bronchodilator, minimal diffusion defect, normal lung volumes. FEV1/FVC 0.76, DLCO 72%  . Coronary artery disease   . Emphysema lung (HCC)    "treated for asthma but later found out it was emphysema" (10/15/2017)  . Essential hypertension 07/19/2016  . GERD (gastroesophageal reflux disease)   . High risk medication use 07/19/2016  . Hyperlipidemia    "left lung"  . Inguinal hernia 07/19/2016   Overview:  Mild bilateral.  . Lung nodule seen on imaging study 07/28/2015   chest x-ray report from 03/05/2014 at [facility] described a vague nodular density in the left upper lobe and recommended follow-up CT scan which was never done.   . MI (myocardial infarction) (HCC) 12/2013; 01/2017  . 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 Prediabetes 07/19/2016  . Pulmonary embolism (HCC) 10/15/2017  . Stage 3 chronic kidney disease (HCC) 07/19/2016  . STEMI (ST elevation myocardial infarction) [facility]) 10/18/2017   Feb 2015    Past Surgical History:  Procedure Laterality Date  . CARDIAC CATHETERIZATION  11/2016  . CATARACT EXTRACTION W/ INTRAOCULAR LENS  IMPLANT, BILATERAL Bilateral   . CORONARY ANGIOPLASTY WITH STENT PLACEMENT  12/2013-02/2014   "2 + 3 stents"  . CORONARY ANGIOPLASTY WITH STENT PLACEMENT  01/2017   distal LAD  . KNEE ARTHROSCOPY Right 1982    Inpatient Medications: Scheduled Meds: . atorvastatin  80 mg Oral QPM  . carvedilol  12.5 mg Oral BID WC  . irbesartan  75 mg Oral Daily  . mometasone-formoterol  2 puff Inhalation QPM  . montelukast  10 mg Oral Daily  . nitroGLYCERIN  0.4 mg Sublingual Once   Continuous Infusions: . heparin 1,500 Units/hr (10/25/20 1233)   PRN Meds: albuterol, nitroGLYCERIN  Allergies:    Allergies   Allergen Reactions  . Pneumovax 23 [Pneumococcal Vac Polyvalent] Other (See Comments)    Some rash and muscle pain for 3 months     Social History:   Social History   Socioeconomic History  . Marital status: Married    Spouse name: Not on file  . Number of children: Not on file  . Years of education: Not on file  . Highest education level: Not on file  Occupational History  . Not on file  Tobacco Use  . Smoking status: Former Smoker    Packs/day: 1.00    Years: 15.00    Pack years: 15.00    Types: Cigarettes    Quit date: 11/19/1976    Years since quitting: 43.9  . Smokeless tobacco: Former Neurosurgeon    Types: Chew    Quit date: Orthoptist  . Vaping Use: Never used  Substance and Sexual Activity  . Alcohol use: No  . Drug use: No  . Sexual activity: Not Currently  Other Topics Concern  . Not on file  Social History Narrative  . Not on file   Social Determinants of Health   Financial Resource Strain:   . Difficulty of Paying Living Expenses: Not on file  Food Insecurity:   . Worried About Programme researcher, broadcasting/film/video in the Last Year: Not on file  . Ran Out of Food in the Last Year: Not on file  Transportation Needs:   . Lack of Transportation (Medical): Not on file  . Lack of Transportation (Non-Medical): Not on file  Physical Activity:   . Days of Exercise per Week: Not on file  . Minutes of Exercise per Session: Not on file  Stress:   . Feeling of Stress : Not on file  Social Connections:   . Frequency of Communication with Friends and Family: Not on file  . Frequency of Social Gatherings with Friends and Family: Not on file  . Attends Religious Services: Not on file  . Active Member of Clubs or Organizations: Not on file  . Attends Banker Meetings: Not on file  . Marital Status: Not on file  Intimate Partner Violence:   . Fear of Current or Ex-Partner: Not on file  . Emotionally Abused: Not on file  . Physically Abused: Not on file  . Sexually  Abused: Not on file    Family History:    Family History  Problem Relation Age of Onset  . Hyperlipidemia Mother   . Hypertension Mother   . Diabetes Mother   . Heart attack Maternal Uncle   . Heart attack Maternal Grandmother      ROS:  Please see the history of present illness.  No fevers, chills, productive cough or hemoptysis. All other ROS reviewed and negative.     Physical Exam/Data:   Vitals:   10/25/20 1100 10/25/20 1130 10/25/20 1200 10/25/20 1230  BP: 107/80 108/78 110/80 112/76  Pulse: 65 64 62 62  Resp: 10 14  12   Temp:      TempSrc:      SpO2: 99% 98% 97% 100%  Weight:      Height:       No intake or output data in the 24 hours ending 10/25/20 1233 Last 3 Weights 10/25/2020 05/18/2020 11/18/2019  Weight (lbs) 185 lb 192 lb 6.4 oz 190 lb 12.8 oz  Weight (kg) 83.915 kg 87.272 kg 86.546 kg     Body mass index is 23.75 kg/m.  General:  Well nourished, well developed, in no acute distress HEENT: normal Lymph: no adenopathy Neck: no JVD Endocrine:  No thryomegaly Vascular: No carotid bruits; FA pulses 2+ bilaterally without bruits  Cardiac:  normal S1, S2; RRR; no murmur  Lungs:  clear to auscultation bilaterally, no wheezing, rhonchi or rales  Abd: soft, nontender, no hepatomegaly  Ext: no edema Musculoskeletal:  No deformities, BUE and BLE strength normal and equal Skin: warm and dry  Neuro:  CNs 2-12 intact, no focal abnormalities noted Psych:  Normal affect   EKG:  The EKG was personally reviewed and demonstrates:  Sinus rhythm previous septal infarct, anterior T wave inversion, lateral T wave inversion. Telemetry:  Telemetry was personally reviewed and demonstrates:     Laboratory Data:  High Sensitivity Troponin:   Recent Labs  Lab 10/24/20 1811 10/24/20 2253 10/25/20 0840  TROPONINIHS 37* 94* 159*     Chemistry Recent Labs  Lab 10/24/20 1811 10/25/20 0840  NA 141 142  K 4.1 3.7  CL 107 108  CO2 25 24  GLUCOSE 108* 106*  BUN 18  20  CREATININE 1.21 1.16  CALCIUM 9.1 9.0  GFRNONAA >60 >60  ANIONGAP 9 10    Recent Labs  Lab 10/25/20 0840  PROT 5.8*  ALBUMIN 3.3*  AST 20  ALT 24  ALKPHOS 97  BILITOT 0.7   Hematology Recent Labs  Lab 10/24/20 1811 10/25/20 0840  WBC 7.2 7.0  RBC 4.50 4.31  HGB 13.7 12.9*  HCT 41.9 39.5  MCV 93.1 91.6  MCH 30.4 29.9  MCHC 32.7 32.7  RDW 13.2 13.3  PLT 194 176    DDimer  Recent Labs  Lab 10/25/20 0338  DDIMER 0.30     Radiology/Studies:  DG Chest 2 View  Result Date: 10/24/2020 CLINICAL DATA:  69-year-old male with left side chest pain, progressive. EXAM: CHEST - 2 VIEW COMPARISON:  Chest CTA 09/30/2018 and earlier. FINDINGS: Lung volumes and mediastinal contours remain within normal limits. Visualized tracheal air column is within normal limits. Stable lungs with no pneumothorax, pulmonary edema or pleural effusion. Mild bibasilar scarring or atelectasis is stable since 2018. No acute osseous abnormality identified. Negative visible bowel gas pattern. IMPRESSION: No acute cardiopulmonary abnormality. Electronically Signed   By: H  Hall M.D.   On: 10/24/2020 18:47     Assessment and Plan:   1. Non-ST elevation myocardial infarction-patient presents with classic symptoms and troponin is minimally elevated (initial troponin 37 increasing to 159).  Presently pain-free.  Plan to continue aspirin, heparin, nitroglycerin patch, carvedilol and Lipitor.  Patient will require cardiac catheterization.  The risk and benefits including myocardial infarction, CVA and death discussed and he agrees to proceed.  Check echocardiogram for LV function. 2. History of unprovoked pulmonary emboli-patient is on chronic apixaban.  He has not received a dose since yesterday morning.  We will hold and resume once all procedures complete. 3. Hyperlipidemia-continue statin. 4. Hypertension-patient's blood pressure is controlled.  Continue present medications and follow.  For questions or  updates, please contact [facility] Please consult [email or web address] for contact info under    Signed, Treshon Stannard   Jens Som, MD  10/25/2020 12:33 PM

## 2020-10-25 NOTE — ED Notes (Signed)
hospitalist at bedside

## 2020-10-25 NOTE — H&P (Signed)
History and Physical    PLEASE NOTE THAT DRAGON DICTATION SOFTWARE WAS USED IN THE CONSTRUCTION OF THIS NOTE.   Derek Hart LOV:[id number] DOB: Mar 20, 1951 DOA: 10/24/2020  PCP: Gordan Payment., MD Patient coming from: home   I have personally briefly reviewed patient's old medical records in [facility]  Chief Complaint: Chest pain  HPI: Derek Hart is a 69 y.o. male with medical history significant for coronary artery disease status post multiple myocardial infarctions, including STEMI in 2018, chronic diastolic heart failure, unprovoked acute pulmonary embolism in 2018 in 2019 chronically anticoagulated on Eliquis, hypertension, hyperlipidemia, COPD, who is admitted to [facility] on 10/24/2020 with suspected NSTEMI after presenting from home to [facility] Emergency Department complaining of chest pain.   The patient reports 3-4 episodes of nonradiating left-sided chest pressure over the last 2 days.  He reports that 2 of these episodes occurred while ambulating, and improved with rest, while one episode started while he was at rest, and worsened with exertion.  Has not taken any sublingual nitroglycerin at home for any of these episodes of chest pain.  The chest pain is nonpleuritic, nonpositional, nonreproducible with palpation over the anterior chest wall.  Denies any associated shortness of breath, palpitations, diaphoresis, nausea, vomiting, presyncope, or syncope.  The patient conveys that the quality of chest pain that he has been experiencing over the last 2 days is similar to that which he had experienced at the time of his prior MIs, although the pain over the last 2 days is less intense relative to that which he had experienced at the time of prior MIs.  In terms of duration of the episodes of chest discomfort over the last 2 days, he reports that each of the episodes lasted approximately 5 minutes before spontaneously resolving in the absence of interval  administration of nitroglycerin, with the exception 1 episode, which lasted approximately 45 minutes before spontaneously resolving, also in the absence of nitroglycerin.  He confirms that he is chest pain-free at this time.  Denies any recent trauma, hemoptysis, peripheral edema, lower extremity erythema, or calf tenderness.  Denies any recent surgical procedures, or periods of diminished ambulatory activity.  Past medical history is notable for history of unprovoked acute pulmonary embolism x2, with the first occurring in 2018 for which he was treated with 6 months of Xarelto, before developing another unprovoked acute pulmonary embolism in 2019, at which time he was started on chronic anticoagulation via Eliquis, with which she reports outstanding compliance.  Of note, most recent dose of Eliquis occurred on the morning of 10/24/2020.  He reports outstanding compliance with his home cardiac regimen, which includes Coreg, valsartan, atorvastatin 80 mg p.o. daily and daily baby aspirin.  Denies any recent subjective fever, chills, rigors, or generalized myalgias.  Denies any recent headache, neck stiffness, rhinitis, rhinorrhea, sore throat, wheezing, cough, abdominal pain, diarrhea, or rash.  No recent known COVID-19 exposures.  Past cardiac history includes STEMI in 2018, with evidence of occlusive disease of the distal LAD for which he underwent angioplasty with stent placement at [facility].  The patient believes that this represents his most recent coronary angiography.  He reports that in June 2021, that he experienced intermittent chest discomfort similar to that with which she presents this evening.  At that time he was evaluated by cardiology, with ensuing recommendations to pursue adenosine stress Myoview.  However, the patient acknowledges the declined this recommendation.   Most recent TTE was  performed in November 2019 and showed a normal left ventricular cavity size,  LVEF 50 to 55%, akinesis of the apical myocardium, and evidence of grade 1 diastolic dysfunction.    ED Course:  Vital signs in the ED were notable for the following: Temperature max 98.1; heart rate 60-66; blood pressure 125/88 - 148/94; respiratory rate 16-20; oxygen saturation 97 to 99% on room air.  Labs were notable for the following: BMP notable for the following: Sodium 141, potassium 4.1, creatinine 1.21 relative to most recent prior value of 1.12 in December 2020.  CBC notable for the following: White blood cell count of 7200, hemoglobin 13.7.  Initial high-sensitivity troponin I noted to be 37, with subsequent value increasing to 94.   2 view chest x-ray showed no evidence of acute cardiopulmonary process, including no evidence infiltrate, edema, effusion, or pneumothorax.  EKG performed in the ED this evening, in comparison to most recent prior performed on 11/17/2020 showed normal sinus rhythm, heart rate 67, QTc 429 MS, and T wave inversion in aVL, V1, V2, V3, and V4, all of which was noted to be present on this most recent prior EKG.  This evening's EKG shows no evidence of ST changes, including no evidence of ST elevation.  While in the ED, the following were administered: Heparin drip initiated; aspirin 324 mg p.o. x1.  The patient's case was discussed with the on-call cardiologist, Dr. Okey Dupre, who agreed with the above management and plans to formally consult on the patient at which time additional recommendations regarding coronary ischemic evaluation will be conveyed.      Review of Systems: As per HPI otherwise 10 point review of systems negative.   Past Medical History:  Diagnosis Date  . Allergic rhinitis 07/19/2016  . Asthma 07/19/2016  . Atherosclerotic heart disease of native coronary artery without angina pectoris 07/28/2015    CAD/ MI/ CABG  Overview:  [place].  MI LAD stent. 01/2017 Overview:  He had MI with staged PCI of LAD and later RCA and LCF 2015, EF 40% Overview:   Added automatically from request for surgery [id number]  . Cardiomyopathy (HCC) 07/19/2016   Overview:  EF 45% or 54% 01/2017  . Chest pain in adult 02/01/2017  . Chronic bronchitis (HCC)   . COPD (chronic obstructive pulmonary disease) (HCC) 04/26/2015   PFT-06/27/2015-minimal obstructive airways disease with insignificant response to bronchodilator, minimal diffusion defect, normal lung volumes. FEV1/FVC 0.76, DLCO 72%  . Coronary artery disease   . Dyspnea on exertion 04/26/2015  . Emphysema lung (HCC)    "treated for asthma but later found out it was emphysema" (10/15/2017)  . Essential hypertension 07/19/2016  . GERD (gastroesophageal reflux disease)   . High risk medication use 07/19/2016  . Hyperlipidemia    "left lung"  . Hypertension   . Inguinal hernia 07/19/2016   Overview:  Mild bilateral.  . Lung nodule seen on imaging study 07/28/2015   chest x-ray report from 03/05/2014 at [facility] described a vague nodular density in the left upper lobe and recommended follow-up CT scan which was never done.   . LV dysfunction 05/30/2017  . MI (myocardial infarction) (HCC) 12/2013; 01/2017  . Mixed hyperlipidemia 07/19/2016  . 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 Prediabetes 07/19/2016  . Pulmonary embolism (HCC) 10/15/2017  . Stage 3 chronic kidney disease (HCC) 07/19/2016  . STEMI (ST elevation myocardial infarction) [facility]) 10/18/2017   Feb 2015    Past Surgical History:  Procedure Laterality Date  . CARDIAC CATHETERIZATION  11/2016  . CATARACT EXTRACTION W/ INTRAOCULAR LENS  IMPLANT, BILATERAL Bilateral   . CORONARY ANGIOPLASTY WITH STENT PLACEMENT  12/2013-02/2014   "2 + 3 stents"  . CORONARY ANGIOPLASTY WITH STENT PLACEMENT  01/2017   distal LAD  . KNEE ARTHROSCOPY Right 1982    Social History:  reports that he quit smoking about 43 years ago. His smoking use included cigarettes. He has a 15.00 pack-year smoking history. He quit smokeless tobacco use about 41 years ago.  His  smokeless tobacco use included chew. He reports that he does not drink alcohol and does not use drugs.   Allergies  Allergen Reactions  . Pneumovax 23 [Pneumococcal Vac Polyvalent] Rash    Family History  Problem Relation Age of Onset  . Hyperlipidemia Mother   . Hypertension Mother   . Diabetes Mother   . Heart attack Maternal Uncle   . Heart attack Maternal Grandmother      Prior to Admission medications   Medication Sig Start Date End Date Taking? Authorizing Provider  albuterol (PROVENTIL HFA;VENTOLIN HFA) 108 (90 Base) MCG/ACT inhaler Inhale 1 puff into the lungs every 6 (six) hours as needed for wheezing or shortness of breath.    [provider]  aspirin EC 81 MG tablet Take 1 tablet (81 mg total) by mouth daily. 09/30/18   Georgeanna Lea, MD  atorvastatin (LIPITOR) 80 MG tablet Take 80 mg by mouth every evening.  04/04/15   [provider]  carvedilol (COREG) 12.5 MG tablet TAKE 1 AND 1/2 TABLETS BY MOUTH TWICE (2) DAILY 01/18/20   Baldo Daub, MD  Cyanocobalamin (B-12 PO) Take 1 tablet by mouth daily.     [provider]  ELIQUIS 5 MG TABS tablet TAKE 1 TABLET BY MOUTH TWICE (2) DAILY 08/16/20   Baldo Daub, MD  ENTRESTO 97-103 MG TAKE 1 TABLET BY MOUTH TWICE (2) DAILY Patient not taking: Reported on 05/18/2020 09/16/19   Baldo Daub, MD  mometasone-formoterol (DULERA) 200-5 MCG/ACT AERO Inhale 2 puffs into the lungs every evening.    [provider]  montelukast (SINGULAIR) 10 MG tablet Take 10 mg by mouth daily. 04/04/15   [provider]  nitroGLYCERIN (NITROSTAT) 0.4 MG SL tablet Place 0.4 mg under the tongue every 5 (five) minutes as needed for chest pain.    [provider]  omeprazole (PRILOSEC) 40 MG capsule Take 40 mg by mouth daily.    [provider]  valsartan (DIOVAN) 80 MG tablet Take 80 mg by mouth daily. 03/03/20   [provider]     Objective    Physical Exam: Vitals:    10/24/20 1805 10/24/20 2259 10/25/20 0206  BP: 125/88 (!) 136/94 (!) 148/94  Pulse: 66 60 62  Resp: 16 16 20   Temp: 98.4 F (36.9 C)  97.9 F (36.6 C)  TempSrc: Oral    SpO2: 97% 98% 99%    General: appears to be stated age; alert, oriented Skin: warm, dry, no rash Head:  AT/[place] Mouth:  Oral mucosa membranes appear moist, normal dentition Neck: supple; trachea midline Heart:  RRR; did not appreciate any M/R/G Lungs: CTAB, did not appreciate any wheezes, rales, or rhonchi Abdomen: + BS; soft, ND, NT Vascular: 2+ pedal pulses b/l; 2+ radial pulses b/l Extremities: no peripheral edema, no muscle wasting Neuro: strength and sensation intact in upper and lower extremities b/l    Labs on Admission: I have personally reviewed following labs and imaging studies  CBC: Recent  Labs  Lab 10/24/20 1811  WBC 7.2  HGB 13.7  HCT 41.9  MCV 93.1  PLT 194   Basic Metabolic Panel: Recent Labs  Lab 10/24/20 1811  NA 141  K 4.1  CL 107  CO2 25  GLUCOSE 108*  BUN 18  CREATININE 1.21  CALCIUM 9.1   GFR: CrCl cannot be calculated (Unknown ideal weight.). Liver Function Tests: No results for input(s): AST, ALT, ALKPHOS, BILITOT, PROT, ALBUMIN in the last 168 hours. No results for input(s): LIPASE, AMYLASE in the last 168 hours. No results for input(s): AMMONIA in the last 168 hours. Coagulation Profile: No results for input(s): INR, PROTIME in the last 168 hours. Cardiac Enzymes: No results for input(s): CKTOTAL, CKMB, CKMBINDEX, TROPONINI in the last 168 hours. BNP (last 3 results) Recent Labs    11/18/19 1158  PROBNP 271   HbA1C: No results for input(s): HGBA1C in the last 72 hours. CBG: No results for input(s): GLUCAP in the last 168 hours. Lipid Profile: No results for input(s): CHOL, HDL, LDLCALC, TRIG, CHOLHDL, LDLDIRECT in the last 72 hours. Thyroid Function Tests: No results for input(s): TSH, T4TOTAL, FREET4, T3FREE, THYROIDAB in the last 72 hours. Anemia  Panel: No results for input(s): VITAMINB12, FOLATE, FERRITIN, TIBC, IRON, RETICCTPCT in the last 72 hours. Urine analysis: No results found for: COLORURINE, APPEARANCEUR, LABSPEC, PHURINE, GLUCOSEU, HGBUR, BILIRUBINUR, KETONESUR, PROTEINUR, UROBILINOGEN, NITRITE, LEUKOCYTESUR  Radiological Exams on Admission: DG Chest 2 View  Result Date: 10/24/2020 CLINICAL DATA:  69 year old male with left side chest pain, progressive. EXAM: CHEST - 2 VIEW COMPARISON:  Chest CTA 09/30/2018 and earlier. FINDINGS: Lung volumes and mediastinal contours remain within normal limits. Visualized tracheal air column is within normal limits. Stable lungs with no pneumothorax, pulmonary edema or pleural effusion. Mild bibasilar scarring or atelectasis is stable since 2018. No acute osseous abnormality identified. Negative visible bowel gas pattern. IMPRESSION: No acute cardiopulmonary abnormality. Electronically Signed   By: Odessa Fleming M.D.   On: 10/24/2020 18:47     EKG: Independently reviewed, with result as described above.    Assessment/Plan   Derek Hart is a 69 y.o. male with medical history significant for coronary artery disease status post multiple myocardial infarctions, including STEMI in 2018, chronic diastolic heart failure, unprovoked acute pulmonary embolism in 2018 in 2019 chronically anticoagulated on Eliquis, hypertension, hyperlipidemia, COPD, who is admitted to [facility] on 10/24/2020 with suspected NSTEMI after presenting from home to [facility] Emergency Department complaining of chest pain.    Principal Problem:   NSTEMI (non-ST elevated myocardial infarction) (HCC) Active Problems:   COPD (chronic obstructive pulmonary disease) (HCC)   Essential hypertension   Mixed hyperlipidemia   Chronic anticoagulation   Chest pain    #) NSTEMI: In the context of a known history of coronary disease with multiple prior MIs as well as multiple CAD risk factors for interval progression,  the patient presents with 2 days of typical sounding chest pain in the form of intermittent left-sided chest pressure that appears to be exertional, improving with rest, which appears qualitatively similar to that which she has experienced at the time of his prior myocardial infarction's, with presenting high sensitivity troponin noted to be elevated at 37, with repeat value trending up to 94.  Presenting EKG shows T wave inversion in aVL as well as the anteroseptal leads, although this appears unchanged relative to most recent prior EKG from December 2021.  No evidence of ST changes, including no evidence of ST  elevation.  Given the patient's extensive cardiac history typical sounding chest pain, and elevated troponin, ACS is suspected.  He has received a full dose aspirin in the ED and heparin drip has been initiated.  The patient's case has been discussed with the on-call cardiologist, Dr. Okey Dupre, who will formally consult on the patient.    Plan: Continue heparin drip.  Resume home beta-blocker, valsartan, and atorvastatin 80 mg.  Monitor on telemetry.  As needed EKGs for any subsequent chest pain.  As needed sublingual nitroglycerin for chest pain.  Add on serum magnesium level, with plan to provide as needed supplementation in order to maintain magnesium level greater than or equal to 2.02 reduce chances of ventricular arrhythmia.  Cardiology has been formally consulted, with additional recommendations currently pending.  Third troponin has been ordered for the morning.  Repeat BMP in the morning.  Bedrest.     #) Chronic diastolic heart failure: Most recent echocardiogram, which was performed in November 2019 showed LVEF 50 to 55%, with normal-appearing left ventricular cavity size, akinesis of the apical myocardium, no evidence of grade 1 diastolic dysfunction.  No clinical or radiographic evidence at this time to suggest acutely decompensated heart failure.  Plan: Monitor strict I's and O's and daily  weights.  Repeat BMP in the morning.     #) Essential hypertension: On Coreg and valsartan as an outpatient.  Systolic blood pressures in the 120s to 140s mmHg since presenting to the ED today.  Plan: In the context of suspected ACS, will continue home Coreg and valsartan.  Close monitoring of ensuing blood pressure via routine vital signs.  Monitor on telemetry.      #) Hyperlipidemia: On high intensity statin as an outpatient in the form of atorvastatin 80 mg p.o. nightly.  Plan: Continue home atorvastatin, with next dose to occur now for plaque stabilization qualities.     #) History of unprovoked acute pulmonary embolism x2: With first acute PE occurring in 2018, followed an additional acute PE in 2019, both felt to be unprovoked.  Following second unprovoked acute pulmonary embolism, the patient has subsequently been on chronic anticoagulation with Eliquis, with most recent dose of such occurring on the morning of 10/24/2020.  We will hold home Eliquis for now in the context of heparin drip, which has been initiated in the ED this evening due to suspected NSTEMI, as above  Plan: Hold home Eliquis for now, as above.  Heparin drip in the context of suspected NSTEMI, as above.  Monitor on telemetry.    #) COPD:  the patient acknowledges that he is a former smoker, but conveys that he completely quit smoking in the late 1970s, without subsequent resumption.  Outpatient respiratory regimen consists of Dulera as well as as needed albuterol.  No clinical evidence to suggest acute COPD exacerbation at this time.  Plan: Continue home scheduled Dulera as well as as needed albuterol.  Monitor continuous pulse oximetry.    DVT prophylaxis: Heparin drip Code Status: Full code Family Communication: none Disposition Plan: Per Rounding Team Consults called: Patient's case was discussed with the on-call cardiologist, Dr. Okey Dupre, as further described above. Admission status: Inpatient; PCU.     Of note, this patient was added by me to the following Admit List/Treatment Team: [email or web address]     PLEASE NOTE THAT DRAGON DICTATION SOFTWARE WAS USED IN THE CONSTRUCTION OF THIS NOTE.   Angie Fava DO [facility] Pager [phone] From 7PM- 7AM  10/25/2020, 3:11 AM

## 2020-10-25 NOTE — Progress Notes (Signed)
  PROGRESS NOTE  Patient admitted earlier this morning. See H&P. He is a 69 year old male with past medical history significant of CAD, chronic diastolic heart failure, history of PE, hypertension, hyperlipidemia who presents with chief complaint of chest pain.   Patient is evaluated in the emergency department. Patient currently admits to some soreness in his chest.  Otherwise no other new symptoms.  Troponin has been elevated up to 159.  Patient evaluated by cardiology, planning for heart cath today.   Status is: Inpatient  Remains inpatient appropriate because:Ongoing diagnostic testing needed not appropriate for outpatient work up   Dispo: The patient is from: Home              Anticipated d/c is to: Home              Anticipated d/c date is: 1 day              Patient currently is not medically stable to d/c.  Cardiology planning for heart cath    Noralee Stain, DO [facility] 10/25/2020, 1:17 PM  Available via Epic secure chat 7am-7pm After these hours, please refer to coverage provider listed on [email or web address]

## 2020-10-25 NOTE — ED Provider Notes (Addendum)
[facility] EMERGENCY DEPARTMENT Provider Note   CSN: [id number] Arrival date & time: 10/24/20  1754     History Chief Complaint  Patient presents with  . Chest Pain    Derek Hart is a 69 y.o. male.  Patient presents to the emergency department for evaluation of chest pain.  Patient has a history of coronary artery disease with multiple heart attacks as well as PE.  He is on Eliquis, last dose was yesterday morning.  Patient reports that he has been having intermittent heaviness in the left chest without shortness of breath.  This feels similar to heart attacks, not his PE.        Past Medical History:  Diagnosis Date  . Allergic rhinitis 07/19/2016  . Asthma 07/19/2016  . Atherosclerotic heart disease of native coronary artery without angina pectoris 07/28/2015    CAD/ MI/ CABG  Overview:  [place].  MI LAD stent. 01/2017 Overview:  He had MI with staged PCI of LAD and later RCA and LCF 2015, EF 40% Overview:  Added automatically from request for surgery [id number]  . Cardiomyopathy (HCC) 07/19/2016   Overview:  EF 45% or 54% 01/2017  . Chest pain in adult 02/01/2017  . Chronic bronchitis (HCC)   . COPD (chronic obstructive pulmonary disease) (HCC) 04/26/2015   PFT-06/27/2015-minimal obstructive airways disease with insignificant response to bronchodilator, minimal diffusion defect, normal lung volumes. FEV1/FVC 0.76, DLCO 72%  . Coronary artery disease   . Dyspnea on exertion 04/26/2015  . Emphysema lung (HCC)    "treated for asthma but later found out it was emphysema" (10/15/2017)  . Essential hypertension 07/19/2016  . GERD (gastroesophageal reflux disease)   . High risk medication use 07/19/2016  . Hyperlipidemia    "left lung"  . Hypertension   . Inguinal hernia 07/19/2016   Overview:  Mild bilateral.  . Lung nodule seen on imaging study 07/28/2015   chest x-ray report from 03/05/2014 at [facility] described a vague nodular density in the left upper lobe  and recommended follow-up CT scan which was never done.   . LV dysfunction 05/30/2017  . MI (myocardial infarction) (HCC) 12/2013; 01/2017  . Mixed hyperlipidemia 07/19/2016  . 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 Prediabetes 07/19/2016  . Pulmonary embolism (HCC) 10/15/2017  . Stage 3 chronic kidney disease (HCC) 07/19/2016  . STEMI (ST elevation myocardial infarction) [facility]) 10/18/2017   Feb 2015    Patient Active Problem List   Diagnosis Date Noted  . 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 Hematuria 08/15/2018  . Chronic anticoagulation 10/21/2017  . STEMI (ST elevation myocardial infarction) (HCC) 10/18/2017  . Ischemic cardiomyopathy 05/30/2017  . 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 Chest pain in adult 02/01/2017  . Allergic rhinitis 07/19/2016  . Asthma 07/19/2016  . Essential hypertension 07/19/2016  . High risk medication use 07/19/2016  . Inguinal hernia 07/19/2016  . Mixed hyperlipidemia 07/19/2016  . Prediabetes 07/19/2016  . Stage 3 chronic kidney disease (HCC) 07/19/2016  . Lung nodule seen on imaging study 07/28/2015  . Coronary artery disease involving native coronary artery of native heart with angina pectoris (HCC) 07/28/2015  . Dyspnea on exertion 04/26/2015  . COPD (chronic obstructive pulmonary disease) (HCC) 04/26/2015    Past Surgical History:  Procedure Laterality Date  . CARDIAC CATHETERIZATION  11/2016  . CATARACT EXTRACTION W/ INTRAOCULAR LENS  IMPLANT, BILATERAL Bilateral   . CORONARY ANGIOPLASTY WITH STENT PLACEMENT  12/2013-02/2014   "2 + 3 stents"  .  CORONARY ANGIOPLASTY WITH STENT PLACEMENT  01/2017   distal LAD  . KNEE ARTHROSCOPY Right 1982       Family History  Problem Relation Age of Onset  . Hyperlipidemia Mother   . Hypertension Mother   . Diabetes Mother   . Heart attack Maternal Uncle   . Heart attack Maternal Grandmother     Social History   Tobacco Use  . Smoking status: Former Smoker    Packs/day:  1.00    Years: 15.00    Pack years: 15.00    Types: Cigarettes    Quit date: 11/19/1976    Years since quitting: 43.9  . Smokeless tobacco: Former Neurosurgeon    Types: Chew    Quit date: Orthoptist  . Vaping Use: Never used  Substance Use Topics  . Alcohol use: No  . Drug use: No    Home Medications Prior to Admission medications   Medication Sig Start Date End Date Taking? Authorizing Provider  albuterol (PROVENTIL HFA;VENTOLIN HFA) 108 (90 Base) MCG/ACT inhaler Inhale 1 puff into the lungs every 6 (six) hours as needed for wheezing or shortness of breath.    [provider]  aspirin EC 81 MG tablet Take 1 tablet (81 mg total) by mouth daily. 09/30/18   Georgeanna Lea, MD  atorvastatin (LIPITOR) 80 MG tablet Take 80 mg by mouth every evening.  04/04/15   [provider]  carvedilol (COREG) 12.5 MG tablet TAKE 1 AND 1/2 TABLETS BY MOUTH TWICE (2) DAILY 01/18/20   Baldo Daub, MD  Cyanocobalamin (B-12 PO) Take 1 tablet by mouth daily.     [provider]  ELIQUIS 5 MG TABS tablet TAKE 1 TABLET BY MOUTH TWICE (2) DAILY 08/16/20   Baldo Daub, MD  ENTRESTO 97-103 MG TAKE 1 TABLET BY MOUTH TWICE (2) DAILY Patient not taking: Reported on 05/18/2020 09/16/19   Baldo Daub, MD  mometasone-formoterol (DULERA) 200-5 MCG/ACT AERO Inhale 2 puffs into the lungs every evening.    [provider]  montelukast (SINGULAIR) 10 MG tablet Take 10 mg by mouth daily. 04/04/15   [provider]  nitroGLYCERIN (NITROSTAT) 0.4 MG SL tablet Place 0.4 mg under the tongue every 5 (five) minutes as needed for chest pain.    [provider]  omeprazole (PRILOSEC) 40 MG capsule Take 40 mg by mouth daily.    [provider]  valsartan (DIOVAN) 80 MG tablet Take 80 mg by mouth daily. 03/03/20   [provider]    Allergies    Pneumovax 23 [pneumococcal vac polyvalent]  Review of Systems   Review of Systems  Respiratory: Negative  for shortness of breath.   Cardiovascular: Positive for chest pain.  All other systems reviewed and are negative.   Physical Exam Updated Vital Signs BP (!) 148/94   Pulse 62   Temp 97.9 F (36.6 C)   Resp 20   SpO2 99%   Physical Exam Vitals and nursing note reviewed.  Constitutional:      General: He is not in acute distress.    Appearance: Normal appearance. He is well-developed.  HENT:     Head: Normocephalic and atraumatic.     Right Ear: Hearing normal.     Left Ear: Hearing normal.     Nose: Nose normal.  Eyes:     Conjunctiva/sclera: Conjunctivae normal.     Pupils: Pupils are equal, round, and reactive to light.  Cardiovascular:  Rate and Rhythm: Regular rhythm.     Heart sounds: S1 normal and S2 normal. No murmur heard.  No friction rub. No gallop.   Pulmonary:     Effort: Pulmonary effort is normal. No respiratory distress.     Breath sounds: Normal breath sounds.  Chest:     Chest wall: No tenderness.  Abdominal:     General: Bowel sounds are normal.     Palpations: Abdomen is soft.     Tenderness: There is no abdominal tenderness. There is no guarding or rebound. Negative signs include Murphy's sign and McBurney's sign.     Hernia: No hernia is present.  Musculoskeletal:        General: Normal range of motion.     Cervical back: Normal range of motion and neck supple.  Skin:    General: Skin is warm and dry.     Findings: No rash.  Neurological:     Mental Status: He is alert and oriented to person, place, and time.     GCS: GCS eye subscore is 4. GCS verbal subscore is 5. GCS motor subscore is 6.     Cranial Nerves: No cranial nerve deficit.     Sensory: No sensory deficit.     Coordination: Coordination normal.  Psychiatric:        Speech: Speech normal.        Behavior: Behavior normal.        Thought Content: Thought content normal.     ED Results / Procedures / Treatments   Labs (all labs ordered are listed, but only abnormal results  are displayed) Labs Reviewed  BASIC METABOLIC PANEL - Abnormal; Notable for the following components:      Result Value   Glucose, Bld 108 (*)    All other components within normal limits  TROPONIN I (HIGH SENSITIVITY) - Abnormal; Notable for the following components:   Troponin I (High Sensitivity) 37 (*)    All other components within normal limits  TROPONIN I (HIGH SENSITIVITY) - Abnormal; Notable for the following components:   Troponin I (High Sensitivity) 94 (*)    All other components within normal limits  CBC  D-DIMER, QUANTITATIVE (NOT AT [facility])    EKG EKG Interpretation  Date/Time:  Monday October 24 2020 18:04:50 EST Ventricular Rate:  67 PR Interval:  200 QRS Duration: 90 QT Interval:  406 QTC Calculation: 429 R Axis:   36 Text Interpretation: Normal sinus rhythm Anteroseptal infarct , age undetermined ST & T wave abnormality, consider lateral ischemia Abnormal ECG Confirmed by Gilda Crease [phone]) on 10/25/2020 2:14:53 AM   Radiology DG Chest 2 View  Result Date: 10/24/2020 CLINICAL DATA:  69 year old male with left side chest pain, progressive. EXAM: CHEST - 2 VIEW COMPARISON:  Chest CTA 09/30/2018 and earlier. FINDINGS: Lung volumes and mediastinal contours remain within normal limits. Visualized tracheal air column is within normal limits. Stable lungs with no pneumothorax, pulmonary edema or pleural effusion. Mild bibasilar scarring or atelectasis is stable since 2018. No acute osseous abnormality identified. Negative visible bowel gas pattern. IMPRESSION: No acute cardiopulmonary abnormality. Electronically Signed   By: Odessa Fleming M.D.   On: 10/24/2020 18:47    Procedures Procedures (including critical care time)  Medications Ordered in ED Medications  heparin ADULT infusion 100 units/mL (25000 units/278mL sodium chloride 0.45%) (has no administration in time range)  aspirin chewable tablet 324 mg (has no administration in time range)  nitroGLYCERIN  (NITROSTAT) SL tablet 0.4 mg (has  no administration in time range)  nitroGLYCERIN (NITROGLYN) 2 % ointment 1 inch (has no administration in time range)    ED Course  I have reviewed the triage vital signs and the nursing notes.  Pertinent labs & imaging results that were available during my care of the patient were reviewed by me and considered in my medical decision making (see chart for details).    MDM Rules/Calculators/A&P                          Patient presents to the emergency department for evaluation of chest pain.  Patient does have a history of coronary artery disease with previous stenting secondary to MI.  Previous studies have been at [facility].  Patient also has a history of PE.  He is taking his Eliquis as prescribed, although he missed his evening dose because he was in the waiting room.  He has not had any other missed doses.  No tachycardia, tachypnea, hypoxia.  Doubt PE, will add D-dimer.  Current presentation is most consistent with acute coronary syndrome.  He is not actively having chest pain currently, but does report that he walked to his car from the waiting room and did develop chest pain with the amount of exertion.  He has improved with rest.  Patient was seen in cardiology and June and was having some symptoms of chest pain at that time.  They had recommended Myoview study at that time but he declined.  Patient will require hospitalization for further management.  Anticoagulation discussed with pharmacy.  As he did not take his evening dose of Eliquis, may initiate heparin.  Discussed with Dr. Okey Dupre, cardiology will consult.  CRITICAL CARE Performed by: Gilda Crease   Total critical care time: 30 minutes  Critical care time was exclusive of separately billable procedures and treating other patients.  Critical care was necessary to treat or prevent imminent or life-threatening deterioration.  Critical care was time spent personally by  me on the following activities: development of treatment plan with patient and/or surrogate as well as nursing, discussions with consultants, evaluation of patient's response to treatment, examination of patient, obtaining history from patient or surrogate, ordering and performing treatments and interventions, ordering and review of laboratory studies, ordering and review of radiographic studies, pulse oximetry and re-evaluation of patient's condition.  Final Clinical Impression(s) / ED Diagnoses Final diagnoses:  Unstable angina pectoris [facility])    Rx / DC Orders ED Discharge Orders    None       Nathaniel Yaden, Canary Brim, MD 10/25/20 [id number]    Gilda Crease, MD 10/25/20 2952    Gilda Crease, MD 10/25/20 [phone]

## 2020-10-25 NOTE — Interval H&P Note (Signed)
History and Physical Interval Note:  10/25/2020 3:46 PM  Derek Hart  has presented today for surgery, with the diagnosis of Nonstemi.  The various methods of treatment have been discussed with the patient and family. After consideration of risks, benefits and other options for treatment, the patient has consented to  Procedure(s): LEFT HEART CATH AND CORONARY ANGIOGRAPHY (N/A) as a surgical intervention.  The patient's history has been reviewed, patient examined, no change in status, stable for surgery.  I have reviewed the patient's chart and labs.  Questions were answered to the patient's satisfaction.    Cath Lab Visit (complete for each Cath Lab visit)  Clinical Evaluation Leading to the Procedure:   ACS: Yes.    Non-ACS:    Anginal Classification: CCS III  Anti-ischemic medical therapy: Minimal Therapy (1 class of medications)  Non-Invasive Test Results: No non-invasive testing performed  Prior CABG: No previous CABG       Theron Arista [facility] 10/25/2020 3:46 PM

## 2020-10-25 NOTE — ED Notes (Signed)
Zole pads on pt, all belongings with his wife, Zole at bedside & transporting to Cath lab at this time.

## 2020-10-25 NOTE — Consult Note (Signed)
Cardiology Consultation:   Patient ID: Derek Hart MRN: [id number]; DOB: 1951-11-06  Admit date: 10/24/2020 Date of Consult: 10/25/2020  Primary Care Provider: Gordan Payment., MD [facility] Cardiologist: Norman Herrlich, MD    Patient Profile:   Derek Hart is a 69 y.o. male with a hx of coronary artery disease, previous pulmonary embolus, hyperlipidemia, hypertension, COPD for evaluation of possible non-ST elevation myocardial infarction at the request of Noralee Stain DO.  History of Present Illness:   Most recent echocardiogram November 2019 showed ejection fraction 50 to 55% with akinesis of the apical myocardium, grade 1 diastolic dysfunction and mild left atrial enlargement.  Patient has had previous PCI of LAD and right coronary artery.  Cardiac catheterization March 2018 at [facility] showed occluded LAD which was treated with drug-eluting stent, 50% OM1, 30% right coronary artery and 60% PDA.  Also with history of unprovoked pulmonary embolus and is on chronic anticoagulation.  Patient has done well since his most recent PCI until 3 days ago.  He noticed a discomfort in his chest with more vigorous activities relieved with rest.  He states it was like "breathing in cold air".  Last evening he developed the same symptoms after dinner that would not resolve.  He presented to the emergency room and was admitted and cardiology now asked to evaluate.  Note the pain did not radiate.  It was not pleuritic, positional and there were no associated symptoms.  Similar to previous cardiac pain but less severe.   Past Medical History:  Diagnosis Date  . Allergic rhinitis 07/19/2016  . Asthma 07/19/2016  . Atherosclerotic heart disease of native coronary artery without angina pectoris 07/28/2015    CAD/ MI/ CABG  Overview:  [place].  MI LAD stent. 01/2017 Overview:  He had MI with staged PCI of LAD and later RCA and LCF 2015, EF 40% Overview:  Added automatically from request for surgery [id number]  .  Cardiomyopathy (HCC) 07/19/2016   Overview:  EF 45% or 54% 01/2017  . Chronic bronchitis (HCC)   . COPD (chronic obstructive pulmonary disease) (HCC) 04/26/2015   PFT-06/27/2015-minimal obstructive airways disease with insignificant response to bronchodilator, minimal diffusion defect, normal lung volumes. FEV1/FVC 0.76, DLCO 72%  . Coronary artery disease   . Emphysema lung (HCC)    "treated for asthma but later found out it was emphysema" (10/15/2017)  . Essential hypertension 07/19/2016  . GERD (gastroesophageal reflux disease)   . High risk medication use 07/19/2016  . Hyperlipidemia    "left lung"  . Inguinal hernia 07/19/2016   Overview:  Mild bilateral.  . Lung nodule seen on imaging study 07/28/2015   chest x-ray report from 03/05/2014 at [facility] described a vague nodular density in the left upper lobe and recommended follow-up CT scan which was never done.   . MI (myocardial infarction) (HCC) 12/2013; 01/2017  . 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 Prediabetes 07/19/2016  . Pulmonary embolism (HCC) 10/15/2017  . Stage 3 chronic kidney disease (HCC) 07/19/2016  . STEMI (ST elevation myocardial infarction) [facility]) 10/18/2017   Feb 2015    Past Surgical History:  Procedure Laterality Date  . CARDIAC CATHETERIZATION  11/2016  . CATARACT EXTRACTION W/ INTRAOCULAR LENS  IMPLANT, BILATERAL Bilateral   . CORONARY ANGIOPLASTY WITH STENT PLACEMENT  12/2013-02/2014   "2 + 3 stents"  . CORONARY ANGIOPLASTY WITH STENT PLACEMENT  01/2017   distal LAD  . KNEE ARTHROSCOPY Right 1982    Inpatient Medications: Scheduled Meds: . atorvastatin  80 mg Oral QPM  . carvedilol  12.5 mg Oral BID WC  . irbesartan  75 mg Oral Daily  . mometasone-formoterol  2 puff Inhalation QPM  . montelukast  10 mg Oral Daily  . nitroGLYCERIN  0.4 mg Sublingual Once   Continuous Infusions: . heparin 1,500 Units/hr (10/25/20 1233)   PRN Meds: albuterol, nitroGLYCERIN  Allergies:    Allergies   Allergen Reactions  . Pneumovax 23 [Pneumococcal Vac Polyvalent] Other (See Comments)    Some rash and muscle pain for 3 months     Social History:   Social History   Socioeconomic History  . Marital status: Married    Spouse name: Not on file  . Number of children: Not on file  . Years of education: Not on file  . Highest education level: Not on file  Occupational History  . Not on file  Tobacco Use  . Smoking status: Former Smoker    Packs/day: 1.00    Years: 15.00    Pack years: 15.00    Types: Cigarettes    Quit date: 11/19/1976    Years since quitting: 43.9  . Smokeless tobacco: Former Neurosurgeon    Types: Chew    Quit date: Orthoptist  . Vaping Use: Never used  Substance and Sexual Activity  . Alcohol use: No  . Drug use: No  . Sexual activity: Not Currently  Other Topics Concern  . Not on file  Social History Narrative  . Not on file   Social Determinants of Health   Financial Resource Strain:   . Difficulty of Paying Living Expenses: Not on file  Food Insecurity:   . Worried About Programme researcher, broadcasting/film/video in the Last Year: Not on file  . Ran Out of Food in the Last Year: Not on file  Transportation Needs:   . Lack of Transportation (Medical): Not on file  . Lack of Transportation (Non-Medical): Not on file  Physical Activity:   . Days of Exercise per Week: Not on file  . Minutes of Exercise per Session: Not on file  Stress:   . Feeling of Stress : Not on file  Social Connections:   . Frequency of Communication with Friends and Family: Not on file  . Frequency of Social Gatherings with Friends and Family: Not on file  . Attends Religious Services: Not on file  . Active Member of Clubs or Organizations: Not on file  . Attends Banker Meetings: Not on file  . Marital Status: Not on file  Intimate Partner Violence:   . Fear of Current or Ex-Partner: Not on file  . Emotionally Abused: Not on file  . Physically Abused: Not on file  . Sexually  Abused: Not on file    Family History:    Family History  Problem Relation Age of Onset  . Hyperlipidemia Mother   . Hypertension Mother   . Diabetes Mother   . Heart attack Maternal Uncle   . Heart attack Maternal Grandmother      ROS:  Please see the history of present illness.  No fevers, chills, productive cough or hemoptysis. All other ROS reviewed and negative.     Physical Exam/Data:   Vitals:   10/25/20 1100 10/25/20 1130 10/25/20 1200 10/25/20 1230  BP: 107/80 108/78 110/80 112/76  Pulse: 65 64 62 62  Resp: 10 14  12   Temp:      TempSrc:      SpO2: 99% 98% 97% 100%  Weight:      Height:       No intake or output data in the 24 hours ending 10/25/20 1233 Last 3 Weights 10/25/2020 05/18/2020 11/18/2019  Weight (lbs) 185 lb 192 lb 6.4 oz 190 lb 12.8 oz  Weight (kg) 83.915 kg 87.272 kg 86.546 kg     Body mass index is 23.75 kg/m.  General:  Well nourished, well developed, in no acute distress HEENT: normal Lymph: no adenopathy Neck: no JVD Endocrine:  No thryomegaly Vascular: No carotid bruits; FA pulses 2+ bilaterally without bruits  Cardiac:  normal S1, S2; RRR; no murmur  Lungs:  clear to auscultation bilaterally, no wheezing, rhonchi or rales  Abd: soft, nontender, no hepatomegaly  Ext: no edema Musculoskeletal:  No deformities, BUE and BLE strength normal and equal Skin: warm and dry  Neuro:  CNs 2-12 intact, no focal abnormalities noted Psych:  Normal affect   EKG:  The EKG was personally reviewed and demonstrates:  Sinus rhythm previous septal infarct, anterior T wave inversion, lateral T wave inversion. Telemetry:  Telemetry was personally reviewed and demonstrates:     Laboratory Data:  High Sensitivity Troponin:   Recent Labs  Lab 10/24/20 1811 10/24/20 2253 10/25/20 0840  TROPONINIHS 37* 94* 159*     Chemistry Recent Labs  Lab 10/24/20 1811 10/25/20 0840  NA 141 142  K 4.1 3.7  CL 107 108  CO2 25 24  GLUCOSE 108* 106*  BUN 18  20  CREATININE 1.21 1.16  CALCIUM 9.1 9.0  GFRNONAA >60 >60  ANIONGAP 9 10    Recent Labs  Lab 10/25/20 0840  PROT 5.8*  ALBUMIN 3.3*  AST 20  ALT 24  ALKPHOS 97  BILITOT 0.7   Hematology Recent Labs  Lab 10/24/20 1811 10/25/20 0840  WBC 7.2 7.0  RBC 4.50 4.31  HGB 13.7 12.9*  HCT 41.9 39.5  MCV 93.1 91.6  MCH 30.4 29.9  MCHC 32.7 32.7  RDW 13.2 13.3  PLT 194 176    DDimer  Recent Labs  Lab 10/25/20 0338  DDIMER 0.30     Radiology/Studies:  DG Chest 2 View  Result Date: 10/24/2020 CLINICAL DATA:  69 year old male with left side chest pain, progressive. EXAM: CHEST - 2 VIEW COMPARISON:  Chest CTA 09/30/2018 and earlier. FINDINGS: Lung volumes and mediastinal contours remain within normal limits. Visualized tracheal air column is within normal limits. Stable lungs with no pneumothorax, pulmonary edema or pleural effusion. Mild bibasilar scarring or atelectasis is stable since 2018. No acute osseous abnormality identified. Negative visible bowel gas pattern. IMPRESSION: No acute cardiopulmonary abnormality. Electronically Signed   By: Odessa Fleming M.D.   On: 10/24/2020 18:47     Assessment and Plan:   1. Non-ST elevation myocardial infarction-patient presents with classic symptoms and troponin is minimally elevated (initial troponin 37 increasing to 159).  Presently pain-free.  Plan to continue aspirin, heparin, nitroglycerin patch, carvedilol and Lipitor.  Patient will require cardiac catheterization.  The risk and benefits including myocardial infarction, CVA and death discussed and he agrees to proceed.  Check echocardiogram for LV function. 2. History of unprovoked pulmonary emboli-patient is on chronic apixaban.  He has not received a dose since yesterday morning.  We will hold and resume once all procedures complete. 3. Hyperlipidemia-continue statin. 4. Hypertension-patient's blood pressure is controlled.  Continue present medications and follow.  For questions or  updates, please contact [facility] Please consult [email or web address] for contact info under    Signed, Arlys John  Jens Som, MD  10/25/2020 12:33 PM

## 2020-10-25 NOTE — Progress Notes (Signed)
ANTICOAGULATION CONSULT NOTE - Follow Up Consult  Pharmacy Consult for Heparin Indication: Hx PE, r/o ACS  Patient Measurements: Height: 6\' 2"  (188 cm) Weight: 83.9 kg (185 lb) IBW/kg (Calculated) : 82.2 Heparin Dosing Weight: 84 kg  Vital Signs: Temp: 97.8 F (36.6 C) (12/07 0830) Temp Source: Oral (12/07 0830) BP: 107/74 (12/07 1300) Pulse Rate: 62 (12/07 1300)  Labs: Recent Labs    10/24/20 1811 10/24/20 2253 10/25/20 0840 10/25/20 1206  HGB 13.7  --  12.9*  --   HCT 41.9  --  39.5  --   PLT 194  --  176  --   APTT  --   --   --  65*  HEPARINUNFRC  --   --   --  0.99*  CREATININE 1.21  --  1.16  --   TROPONINIHS 37* 94* 159*  --     Estimated Creatinine Clearance: 68.9 mL/min (by C-G formula based on SCr of 1.16 mg/dL).   Medications:  Infusions:  . sodium chloride     Followed by  . sodium chloride    . heparin 1,500 Units/hr (10/25/20 1233)    Assessment: 69 y/o M on apixaban PTA for hx of PE presents to the ED with chest pain. Holding apixaban for now and starting heparin for r/o ACS.  aPTT level this afternoon is slightly SUBtherapeutic (aPTT 65, goal of 66-102). Heparin level falsely elevated in the setting of recent Apixaban use.   Goal of Therapy:  Heparin level 0.3-0.7 units/ml aPTT 66-102 seconds Monitor platelets by anticoagulation protocol: Yes   Plan:  - Increase Heparin to 1600 units/hr - Will continue to monitor for any signs/symptoms of bleeding and will follow up with heparin level in 8 hours   Thank you for allowing pharmacy to be a part of this patient's care.  65, PharmD, BCPS Clinical Pharmacist Clinical phone for 10/25/2020: [phone] 10/25/2020 2:09 PM   **Pharmacist phone directory can now be found on [email or web address] ([email or web address]).  Listed under [facility].

## 2020-10-25 NOTE — ED Notes (Signed)
Informed Consent was signed by pt & witnessed by this RN.

## 2020-10-26 ENCOUNTER — Other Ambulatory Visit: Payer: Self-pay

## 2020-10-26 ENCOUNTER — Encounter (HOSPITAL_COMMUNITY): Payer: Self-pay | Admitting: Cardiology

## 2020-10-26 ENCOUNTER — Telehealth: Payer: Self-pay | Admitting: Cardiology

## 2020-10-26 DIAGNOSIS — J42 Unspecified chronic bronchitis: Secondary | ICD-10-CM | POA: Insufficient documentation

## 2020-10-26 DIAGNOSIS — E785 Hyperlipidemia, unspecified: Secondary | ICD-10-CM | POA: Insufficient documentation

## 2020-10-26 DIAGNOSIS — I2510 Atherosclerotic heart disease of native coronary artery without angina pectoris: Secondary | ICD-10-CM | POA: Insufficient documentation

## 2020-10-26 DIAGNOSIS — J439 Emphysema, unspecified: Secondary | ICD-10-CM | POA: Insufficient documentation

## 2020-10-26 DIAGNOSIS — I219 Acute myocardial infarction, unspecified: Secondary | ICD-10-CM | POA: Insufficient documentation

## 2020-10-26 DIAGNOSIS — K219 Gastro-esophageal reflux disease without esophagitis: Secondary | ICD-10-CM | POA: Insufficient documentation

## 2020-10-26 LAB — BASIC METABOLIC PANEL
Anion gap: 8 (ref 5–15)
BUN: 19 mg/dL (ref 8–23)
CO2: 24 mmol/L (ref 22–32)
Calcium: 8.5 mg/dL — ABNORMAL LOW (ref 8.9–10.3)
Chloride: 108 mmol/L (ref 98–111)
Creatinine, Ser: 1.27 mg/dL — ABNORMAL HIGH (ref 0.61–1.24)
GFR, Estimated: >60 mL/min (ref >60)
Glucose, Bld: 106 mg/dL — ABNORMAL HIGH (ref 70–99)
Potassium: 3.7 mmol/L (ref 3.5–5.1)
Sodium: 140 mmol/L (ref 135–145)

## 2020-10-26 LAB — CBC
HCT: 37.7 % — ABNORMAL LOW (ref 39.0–52.0)
Hemoglobin: 12.5 g/dL — ABNORMAL LOW (ref 13.0–17.0)
MCH: 30 pg (ref 26.0–34.0)
MCHC: 33.2 g/dL (ref 30.0–36.0)
MCV: 90.4 fL (ref 80.0–100.0)
Platelets: 173 10*3/uL (ref 150–400)
RBC: 4.17 MIL/uL — ABNORMAL LOW (ref 4.22–5.81)
RDW: 13.4 % (ref 11.5–15.5)
WBC: 7.7 10*3/uL (ref 4.0–10.5)
nRBC: 0 % (ref 0.0–0.2)

## 2020-10-26 MED ORDER — APIXABAN 5 MG PO TABS
5.0000 mg | ORAL_TABLET | Freq: Two times a day (BID) | ORAL | Status: DC
  Administered 2020-10-26: 5 mg via ORAL
  Filled 2020-10-26: qty 1

## 2020-10-26 MED ORDER — CLOPIDOGREL BISULFATE 75 MG PO TABS
75.0000 mg | ORAL_TABLET | Freq: Every day | ORAL | 1 refills | Status: DC
Start: 2020-10-27 — End: 2021-02-06

## 2020-10-26 MED ORDER — ASPIRIN EC 81 MG PO TBEC: 81.0000 mg | DELAYED_RELEASE_TABLET | Freq: Every day | ORAL | 0 refills | Status: DC

## 2020-10-26 NOTE — Plan of Care (Signed)
  Problem: Clinical Measurements: Goal: Will remain free from infection Outcome: Progressing Goal: Diagnostic test results will improve Outcome: Progressing Goal: Cardiovascular complication will be avoided Outcome: Progressing   Problem: Elimination: Goal: Will not experience complications related to bowel motility Outcome: Progressing   Problem: Safety: Goal: Ability to remain free from injury will improve Outcome: Progressing   Problem: Education: Goal: Understanding of CV disease, CV risk reduction, and recovery process will improve Outcome: Progressing Goal: Individualized Educational Video(s) Outcome: Progressing   Problem: Activity: Goal: Ability to return to baseline activity level will improve Outcome: Progressing   Problem: Health Behavior/Discharge Planning: Goal: Ability to safely manage health-related needs after discharge will improve Outcome: Progressing

## 2020-10-26 NOTE — Discharge Instructions (Signed)
PLEASE REMEMBER TO BRING ALL OF YOUR MEDICATIONS TO EACH OF YOUR FOLLOW-UP OFFICE VISITS.  PLEASE ATTEND ALL SCHEDULED FOLLOW-UP APPOINTMENTS.   Activity: Increase activity slowly as tolerated. You may shower, but no soaking baths (or swimming) for 1 week. No driving for 24 hours. No lifting over 5 lbs for 1 week. No sexual activity for 1 week.   You May Return to Work: in 1 week (if applicable)  Wound Care: You may wash cath site gently with soap and water. Keep cath site clean and dry. If you notice pain, swelling, bleeding or pus at your cath site, please call [phone].   Additional discharge instructions  Please get your medications reviewed and adjusted by your Primary MD.  Please request your Primary MD to go over all Hospital Tests and Procedure/Radiological results at the follow up, please get all Hospital records sent to your Prim MD by signing hospital release before you go home.  If you had Pneumonia of Lung problems at the Hospital: Please get a 2 view Chest X ray done in 6-8 weeks after hospital discharge or sooner if instructed by your Primary MD.  If you have Congestive Heart Failure: Please call your Cardiologist or Primary MD anytime you have any of the following symptoms:  1) 3 pound weight gain in 24 hours or 5 pounds in 1 week  2) shortness of breath, with or without a dry hacking cough  3) swelling in the hands, feet or stomach  4) if you have to sleep on extra pillows at night in order to breathe  Follow cardiac low salt diet and 1.5 lit/day fluid restriction.  If you have diabetes Accuchecks 4 times/day, Once in AM empty stomach and then before each meal. Log in all results and show them to your primary doctor at your next visit. If any glucose reading is under 80 or above 300 call your primary MD immediately.  If you have Seizure/Convulsions/Epilepsy: Please do not drive, operate heavy machinery, participate in activities at heights or participate in  high speed sports until you have seen by Primary MD or a Neurologist and advised to do so again.  If you had Gastrointestinal Bleeding: Please ask your Primary MD to check a complete blood count within one week of discharge or at your next visit. Your endoscopic/colonoscopic biopsies that are pending at the time of discharge, will also need to followed by your Primary MD.  Get Medicines reviewed and adjusted. Please take all your medications with you for your next visit with your Primary MD  Please request your Primary MD to go over all hospital tests and procedure/radiological results at the follow up, please ask your Primary MD to get all Hospital records sent to his/her office.  If you experience worsening of your admission symptoms, develop shortness of breath, life threatening emergency, suicidal or homicidal thoughts you must seek medical attention immediately by calling 911 or calling your MD immediately  if symptoms less severe.  You must read complete instructions/literature along with all the possible adverse reactions/side effects for all the Medicines you take and that have been prescribed to you. Take any new Medicines after you have completely understood and accpet all the possible adverse reactions/side effects.   Do not drive or operate heavy machinery when taking Pain medications.   Do not take more than prescribed Pain, Sleep and Anxiety Medications  Special Instructions: If you have smoked or chewed Tobacco  in the last 2 yrs please stop smoking, stop any regular  Alcohol  and or any Recreational drug use.  Wear Seat belts while driving.  Please note You were cared for by a hospitalist during your hospital stay. If you have any questions about your discharge medications or the care you received while you were in the hospital after you are discharged, you can call the unit and asked to speak with the hospitalist on call if the hospitalist that took care of you is not  available. Once you are discharged, your primary care physician will handle any further medical issues. Please note that NO REFILLS for any discharge medications will be authorized once you are discharged, as it is imperative that you return to your primary care physician (or establish a relationship with a primary care physician if you do not have one) for your aftercare needs so that they can reassess your need for medications and monitor your lab values.  You can reach the hospitalist office at phone [phone] or fax [phone]   If you do not have a primary care physician, you can call [phone] for a physician referral.

## 2020-10-26 NOTE — Plan of Care (Signed)
  Problem: Clinical Measurements: Goal: Cardiovascular complication will be avoided Outcome: Progressing   Problem: Education: Goal: Knowledge of General Education information will improve Description: Including pain rating scale, medication(s)/side effects and non-pharmacologic comfort measures Outcome: Completed/Met   Problem: Health Behavior/Discharge Planning: Goal: Ability to manage health-related needs will improve Outcome: Completed/Met   Problem: Clinical Measurements: Goal: Ability to maintain clinical measurements within normal limits will improve Outcome: Completed/Met   Problem: Activity: Goal: Risk for activity intolerance will decrease Outcome: Completed/Met   Problem: Nutrition: Goal: Adequate nutrition will be maintained Outcome: Completed/Met   Problem: Coping: Goal: Level of anxiety will decrease Outcome: Completed/Met   Problem: Elimination: Goal: Will not experience complications related to urinary retention Outcome: Completed/Met   Problem: Pain Managment: Goal: General experience of comfort will improve Outcome: Completed/Met   Problem: Cardiovascular: Goal: Ability to achieve and maintain adequate cardiovascular perfusion will improve Outcome: Completed/Met Goal: Vascular access site(s) Level 0-1 will be maintained Outcome: Completed/Met   Problem: Clinical Measurements: Goal: Respiratory complications will improve Outcome: Not Applicable   Problem: Skin Integrity: Goal: Risk for impaired skin integrity will decrease Outcome: Not Applicable

## 2020-10-26 NOTE — Progress Notes (Signed)
Progress Note  Patient Name: Derek Hart Date of Encounter: 10/26/2020  [facility] Cardiologist: Norman Herrlich, MD   Subjective   No CP or dyspnea  Inpatient Medications    Scheduled Meds: . aspirin  81 mg Oral Daily  . atorvastatin  80 mg Oral QPM  . carvedilol  12.5 mg Oral BID WC  . clopidogrel  75 mg Oral Q breakfast  . irbesartan  75 mg Oral Daily  . mometasone-formoterol  2 puff Inhalation QPM  . montelukast  10 mg Oral Daily  . nitroGLYCERIN  0.4 mg Sublingual Once  . sodium chloride flush  3 mL Intravenous Q12H   Continuous Infusions: . sodium chloride     PRN Meds: sodium chloride, albuterol, nitroGLYCERIN, sodium chloride flush   Vital Signs    Vitals:   10/25/20 1841 10/25/20 1920 10/25/20 2328 10/26/20 0507  BP: 121/86 116/80 110/78 113/83  Pulse: 77 73 65 69  Resp: 16 18 16 20   Temp:  98.7 F (37.1 C) 98.3 F (36.8 C) 98.6 F (37 C)  TempSrc:  Oral Oral Oral  SpO2: 95% 98% 96%   Weight:    88.4 kg  Height:        Intake/Output Summary (Last 24 hours) at 10/26/2020 0801 Last data filed at 10/25/2020 2328 Gross per 24 hour  Intake --  Output 650 ml  Net -650 ml   Last 3 Weights 10/26/2020 10/25/2020 05/18/2020  Weight (lbs) 194 lb 14.2 oz 185 lb 192 lb 6.4 oz  Weight (kg) 88.4 kg 83.915 kg 87.272 kg      Telemetry    Sinus- Personally Reviewed   Physical Exam   GEN: No acute distress.   Neck: No JVD Cardiac: RRR, no murmurs, rubs, or gallops.  Respiratory: Clear to auscultation bilaterally. GI: Soft, nontender, non-distended  MS: No edema; radial cath site with no hematoma Neuro:  Nonfocal  Psych: Normal affect   Labs    High Sensitivity Troponin:   Recent Labs  Lab 10/24/20 1811 10/24/20 2253 10/25/20 0840  TROPONINIHS 37* 94* 159*      Chemistry Recent Labs  Lab 10/24/20 1811 10/25/20 0840 10/26/20 0341  NA 141 142 140  K 4.1 3.7 3.7  CL 107 108 108  CO2 25 24 24   GLUCOSE 108* 106* 106*  BUN 18 20 19    CREATININE 1.21 1.16 1.27*  CALCIUM 9.1 9.0 8.5*  PROT  --  5.8*  --   ALBUMIN  --  3.3*  --   AST  --  20  --   ALT  --  24  --   ALKPHOS  --  97  --   BILITOT  --  0.7  --   GFRNONAA >60 >60 >60  ANIONGAP 9 10 8      Hematology Recent Labs  Lab 10/24/20 1811 10/25/20 0840 10/26/20 0341  WBC 7.2 7.0 7.7  RBC 4.50 4.31 4.17*  HGB 13.7 12.9* 12.5*  HCT 41.9 39.5 37.7*  MCV 93.1 91.6 90.4  MCH 30.4 29.9 30.0  MCHC 32.7 32.7 33.2  RDW 13.2 13.3 13.4  PLT 194 176 173    DDimer  Recent Labs  Lab 10/25/20 0338  DDIMER 0.30     Radiology    DG Chest 2 View  Result Date: 10/24/2020 CLINICAL DATA:  70 year old male with left side chest pain, progressive. EXAM: CHEST - 2 VIEW COMPARISON:  Chest CTA 09/30/2018 and earlier. FINDINGS: Lung volumes and mediastinal contours remain within normal limits.  Visualized tracheal air column is within normal limits. Stable lungs with no pneumothorax, pulmonary edema or pleural effusion. Mild bibasilar scarring or atelectasis is stable since 2018. No acute osseous abnormality identified. Negative visible bowel gas pattern. IMPRESSION: No acute cardiopulmonary abnormality. Electronically Signed   By: Odessa Fleming M.D.   On: 10/24/2020 18:47   CARDIAC CATHETERIZATION  Result Date: 10/25/2020  Previously placed Mid RCA stent (unknown type) is widely patent.  Previously placed Prox RCA-1 stent (unknown type) is widely patent.  Prox RCA-2 lesion is 30% stenosed.  Dist RCA lesion is 35% stenosed.  RPDA lesion is 60% stenosed.  1st RPL lesion is 80% stenosed.  Previously placed Mid Cx to Dist Cx stent (unknown type) is widely patent.  1st Mrg lesion is 95% stenosed.  2nd Mrg lesion is 50% stenosed.  3rd Mrg lesion is 40% stenosed.  Prox LAD to Mid LAD lesion is 95% stenosed.  A drug-eluting stent was successfully placed using a STENT RESOLUTE ONYX 3.5X15.  Post intervention, there is a 0% residual stenosis.  Dist LAD lesion is 99% stenosed.   Mid LAD to Dist LAD lesion is 50% stenosed.  LV end diastolic pressure is normal.  1. 3 vessel CAD    - the culprit lesion is the proximal LAD with 95% in stent restenosis.    - 99% occlusion of the distal LAD stent with good right to left collaterals from the RCA    - diffuse 95% stenosis in tiny OM1. Patent stent in the mid LCx    - Patent stents in the proximal and mid RCA    - 80% stenosis in a very small PL branch. 60% ostial PDA 2. Normal LVEDP 3. Successful PCI of the proximal LAD with IVUS guidance and DES x 1 4. Unable to cross the distal LAD occlusion with a wire. This portion of the vessel is well collateralized. Plan: DAPT with ASA for one month and Plavix for 12 months. May resume DOAC tomorrow. Plan to switch Eliquis to Xarelto due to formulary coverage. Anticipate DC in am.    Patient Profile     69 y.o. male admitted with non-ST elevation myocardial infarction.  Assessment & Plan    1. Non-ST elevation myocardial infarction-patient is status post PCI of proximal LAD.  We will continue aspirin and Plavix for 30 days and then discontinue aspirin given need for anticoagulation.  Continue statin.  Continue carvedilol.  We will arrange outpatient echocardiogram to assess LV function.   2. History of unprovoked pulmonary emboli-resume apixaban at discharge.  Patient will likely need to change to Xarelto at the first of the year due to insurance issues.  He can follow-up with Dr. Dulce Sellar for this. 3. Hyperlipidemia-continue statin. 4. Hypertension-patient's blood pressure is controlled.  Continue present medications and follow.  Patient can be discharged from a cardiac standpoint with directions as outlined above.  Continue present medications.  We will arrange follow-up in  in 1 to 2 weeks with Dr. Dulce Sellar.  We will sign off.  Please call with questions.  For questions or updates, please contact [facility] Please consult [email or web address] for contact info under         Signed, Olga Millers, MD  10/26/2020, 8:01 AM

## 2020-10-26 NOTE — Progress Notes (Signed)
CARDIAC REHAB PHASE I   PRE:  Rate/Rhythm: 84 SR  BP:  Supine:   Sitting: 147/88, 139/87  Standing:    SaO2:   MODE:  Ambulation: 750 ft   POST:  Rate/Rhythm: 103 ST  BP:  Supine:   Sitting: 147/90  Standing:    SaO2: 96%RA 0908-0957 Pt walked 750 ft on RA with steady gait and tolerated well. No CP. MI education completed with pt who voiced understanding. Reviewed importance of plavix with stent. Reviewed NTG use, walking for exercise, gave heart healthy diet and CRP 2. Pt has attended program before. Referring to [place].    Luetta Nutting, RN BSN  10/26/2020 9:52 AM

## 2020-10-26 NOTE — Plan of Care (Signed)
  Problem: Clinical Measurements: Goal: Will remain free from infection 10/26/2020 1426 by Durward Fortes, RN Outcome: Adequate for Discharge 10/26/2020 0804 by Durward Fortes, RN Outcome: Progressing Goal: Diagnostic test results will improve 10/26/2020 1426 by Durward Fortes, RN Outcome: Adequate for Discharge 10/26/2020 0804 by Durward Fortes, RN Outcome: Progressing Goal: Cardiovascular complication will be avoided 10/26/2020 1426 by Durward Fortes, RN Outcome: Adequate for Discharge 10/26/2020 0804 by Durward Fortes, RN Outcome: Progressing   Problem: Elimination: Goal: Will not experience complications related to bowel motility 10/26/2020 1426 by Durward Fortes, RN Outcome: Adequate for Discharge 10/26/2020 0804 by Durward Fortes, RN Outcome: Progressing   Problem: Safety: Goal: Ability to remain free from injury will improve 10/26/2020 1426 by Durward Fortes, RN Outcome: Adequate for Discharge 10/26/2020 0804 by Durward Fortes, RN Outcome: Progressing   Problem: Education: Goal: Understanding of CV disease, CV risk reduction, and recovery process will improve 10/26/2020 1426 by Durward Fortes, RN Outcome: Adequate for Discharge 10/26/2020 0804 by Durward Fortes, RN Outcome: Progressing Goal: Individualized Educational Video(s) 10/26/2020 1426 by Durward Fortes, RN Outcome: Adequate for Discharge 10/26/2020 0804 by Durward Fortes, RN Outcome: Progressing   Problem: Activity: Goal: Ability to return to baseline activity level will improve 10/26/2020 1426 by Durward Fortes, RN Outcome: Adequate for Discharge 10/26/2020 0804 by Durward Fortes, RN Outcome: Progressing   Problem: Health Behavior/Discharge Planning: Goal: Ability to safely manage health-related needs after discharge will improve 10/26/2020 1426 by Durward Fortes, RN Outcome: Adequate for Discharge 10/26/2020 0804 by Durward Fortes, RN Outcome: Progressing

## 2020-10-26 NOTE — Telephone Encounter (Signed)
TOC per Judy Pimple on 12/06/19 at 9:20am

## 2020-10-26 NOTE — Discharge Summary (Signed)
Physician Discharge Summary  Derek Hart BSJ:[id number] DOB: [birth year]  PCP: Gordan Payment., MD  Admitted from: Home Discharged to: Home  Admit date: 10/24/2020 Discharge date: 10/26/2020  Recommendations for Outpatient Follow-up:    Follow-up Information    Baldo Daub, MD Follow up on 11/04/2020.   Specialty: Cardiology Why: Please arrive 15 minutes early for your 9:20am post-hospital follow-up appointment. Please note this is at our [place] location as Dr. Dulce Sellar did not have any availability at our [facility] location. Sorry for the inconvenience.  Contact information: [address] [place] [address] [phone]        Gordan Payment., MD. Schedule an appointment as soon as possible for a visit in 1 week(s).   Specialty: Internal Medicine Why: To be seen with repeat labs (CBC & BMP). Contact information: [address] [place] [address] [phone]        Baldo Daub, MD .   Specialty: Cardiology Contact information: [address] [place] [address] [phone]                Home Health: None    Equipment/Devices: None    Discharge Condition: Improved and stable   Code Status: Full Code Diet recommendation:  Discharge Diet Orders (From admission, onward)    Start     Ordered   10/26/20 0000  Diet - low sodium heart healthy        10/26/20 1422           Discharge Diagnoses:  Principal Problem:   NSTEMI (non-ST elevated myocardial infarction) (HCC) Active Problems:   COPD (chronic obstructive pulmonary disease) (HCC)   Essential hypertension   Mixed hyperlipidemia   Chronic anticoagulation   Chest pain   Brief Summary: 69 year old male with PMH of CAD, chronic diastolic CHF, PE, hypertension, hyperlipidemia, presented with complaints of chest pain.  His troponins were elevated.  He was diagnosed with NSTEMI.  Cardiology was consulted and patient underwent cardiac cath with PCI on  10/25/2020.  HPI details copied as below by Dr. Newton Pigg  " HPI: Derek Hart is a 69 y.o. male with medical history significant for coronary artery disease status post multiple myocardial infarctions, including STEMI in 2018, chronic diastolic heart failure, unprovoked acute pulmonary embolism in 2018 in 2019 chronically anticoagulated on Eliquis, hypertension, hyperlipidemia, COPD, who is admitted to [facility] on 10/24/2020 with suspected NSTEMI after presenting from home to [facility] complaining of chest pain.   The patient reports 3-4 episodes of nonradiating left-sided chest pressure over the last 2 days.  He reports that 2 of these episodes occurred while ambulating, and improved with rest, while one episode started while he was at rest, and worsened with exertion.  Has not taken any sublingual nitroglycerin at home for any of these episodes of chest pain.  The chest pain is nonpleuritic, nonpositional, nonreproducible with palpation over the anterior chest wall.  Denies any associated shortness of breath, palpitations, diaphoresis, nausea, vomiting, presyncope, or syncope.  The patient conveys that the quality of chest pain that he has been experiencing over the last 2 days is similar to that which he had experienced at the time of his prior MIs, although the pain over the last 2 days is less intense relative to that which he had experienced at the time of prior MIs.  In terms of duration of the episodes of chest discomfort over the last 2 days, he reports  that each of the episodes lasted approximately 5 minutes before spontaneously resolving in the absence of interval administration of nitroglycerin, with the exception 1 episode, which lasted approximately 45 minutes before spontaneously resolving, also in the absence of nitroglycerin.  He confirms that he is chest pain-free at this time.  Denies any recent trauma, hemoptysis, peripheral edema, lower  extremity erythema, or calf tenderness.  Denies any recent surgical procedures, or periods of diminished ambulatory activity.  Past medical history is notable for history of unprovoked acute pulmonary embolism x2, with the first occurring in 2018 for which he was treated with 6 months of Xarelto, before developing another unprovoked acute pulmonary embolism in 2019, at which time he was started on chronic anticoagulation via Eliquis, with which she reports outstanding compliance.  Of note, most recent dose of Eliquis occurred on the morning of 10/24/2020.  He reports outstanding compliance with his home cardiac regimen, which includes Coreg, valsartan, atorvastatin 80 mg p.o. daily and daily baby aspirin.  Denies any recent subjective fever, chills, rigors, or generalized myalgias.  Denies any recent headache, neck stiffness, rhinitis, rhinorrhea, sore throat, wheezing, cough, abdominal pain, diarrhea, or rash.  No recent known COVID-19 exposures.  Past cardiac history includes STEMI in 2018, with evidence of occlusive disease of the distal LAD for which he underwent angioplasty with stent placement at [facility].  The patient believes that this represents his most recent coronary angiography.  He reports that in June 2021, that he experienced intermittent chest discomfort similar to that with which she presents this evening.  At that time he was evaluated by cardiology, with ensuing recommendations to pursue adenosine stress Myoview.  However, the patient acknowledges the declined this recommendation.   Most recent TTE was performed in November 2019 and showed a normal left ventricular cavity size, LVEF 50 to 55%, akinesis of the apical myocardium, and evidence of grade 1 diastolic dysfunction.    ED Course:  Vital signs in the ED were notable for the following: Temperature max 98.1; heart rate 60-66; blood pressure 125/88 - 148/94; respiratory rate 16-20; oxygen saturation  97 to 99% on room air.  Labs were notable for the following: BMP notable for the following: Sodium 141, potassium 4.1, creatinine 1.21 relative to most recent prior value of 1.12 in December 2020.  CBC notable for the following: White blood cell count of 7200, hemoglobin 13.7.  Initial high-sensitivity troponin I noted to be 37, with subsequent value increasing to 94.   2 view chest x-ray showed no evidence of acute cardiopulmonary process, including no evidence infiltrate, edema, effusion, or pneumothorax.  EKG performed in the ED this evening, in comparison to most recent prior performed on 11/17/2020 showed normal sinus rhythm, heart rate 67, QTc 429 MS, and T wave inversion in aVL, V1, V2, V3, and V4, all of which was noted to be present on this most recent prior EKG.  This evening's EKG shows no evidence of ST changes, including no evidence of ST elevation.  While in the ED, the following were administered: Heparin drip initiated; aspirin 324 mg p.o. x1.  The patient's case was discussed with the on-call cardiologist, Dr. Okey Dupre, who agreed with the above management and plans to formally consult on the patient at which time additional recommendations regarding coronary ischemic evaluation will be conveyed."  Assessment and plan:  1. NSTEMI: Cardiology was consulted.  He was briefly treated with heparin drip while Eliquis was held.  HS troponin peaked to 159.  He underwent cardiac cath and PCI of proximal LAD on 12/7.  Cardiac cath detailed report as noted below.  Cardiology has followed up today.  Patient asymptomatic of chest pain.  They have cleared him for discharge and recommend following meds at discharge: Aspirin 81 mg daily for 30 days and then discontinue, continue Plavix 75 mg daily, prior home dose of statin and carvedilol.  Cardiology has cleared him for discharge home, will arrange outpatient echocardiogram to assess LV function and have arranged outpatient follow-up.  I discussed with  Dr. Jens Som.  Patient has mild bump in his creatinine from 1.16 yesterday to 1.27.  Recommend close outpatient follow-up of BMP.  This may be related to cath. 2. History of unprovoked pulmonary emboli, recurrent: Continue apixaban at discharge.  As per cardiology, patient will likely need to change to Xarelto at the first of the year due to insurance issues.  He can follow-up with Dr. Dulce Sellar for this. 3. Hyperlipidemia: Continue prior home dose of statins. 4. Essential hypertension: Controlled.  As discussed with Dr. Jens Som, continue prior home dose of carvedilol.  Also continue prior home dose of valsartan. 5. Chronic diastolic CHF: Most recent echo 2019 November showed LVEF 50-55%.  Clinically compensated.  Not on diuretics at home.  Also not on Entresto PTA.  Outpatient follow-up with cardiology. 6. COPD: Former smoker.  Quit smoking in the 1970s.  No clinical bronchospasm.  Continue prior home meds.  Consultations:  Cardiology  Procedures:  Catheterization 10/25/2020:  Conclusion:   Previously placed Mid RCA stent (unknown type) is widely patent.  Previously placed Prox RCA-1 stent (unknown type) is widely patent.  Prox RCA-2 lesion is 30% stenosed.  Dist RCA lesion is 35% stenosed.  RPDA lesion is 60% stenosed.  1st RPL lesion is 80% stenosed.  Previously placed Mid Cx to Dist Cx stent (unknown type) is widely patent.  1st Mrg lesion is 95% stenosed.  2nd Mrg lesion is 50% stenosed.  3rd Mrg lesion is 40% stenosed.  Prox LAD to Mid LAD lesion is 95% stenosed.  A drug-eluting stent was successfully placed using a STENT RESOLUTE ONYX 3.5X15.  Post intervention, there is a 0% residual stenosis.  Dist LAD lesion is 99% stenosed.  Mid LAD to Dist LAD lesion is 50% stenosed.  LV end diastolic pressure is normal.   1. 3 vessel CAD    - the culprit lesion is the proximal LAD with 95% in stent restenosis.     - 99% occlusion of the distal LAD stent with good right to  left collaterals from the RCA    - diffuse 95% stenosis in tiny OM1. Patent stent in the mid LCx    - Patent stents in the proximal and mid RCA    - 80% stenosis in a very small PL branch. 60% ostial PDA 2. Normal LVEDP 3. Successful PCI of the proximal LAD with IVUS guidance and DES x 1 4. Unable to cross the distal LAD occlusion with a wire. This portion of the vessel is well collateralized.  Plan: DAPT with ASA for one month and Plavix for 12 months. May resume DOAC tomorrow. Plan to switch Eliquis to Xarelto due to formulary coverage. Anticipate DC in am.     Discharge Instructions  Discharge Instructions    Amb Referral to Cardiac Rehabilitation   Complete by: As directed    Referring to Ray CRP 2   Diagnosis:  NSTEMI Coronary Stents     After initial evaluation and assessments completed:  Virtual Based Care may be provided alone or in conjunction with Phase 2 Cardiac Rehab based on patient barriers.: Yes   Call MD for:  difficulty breathing, headache or visual disturbances   Complete by: As directed    Call MD for:  extreme fatigue   Complete by: As directed    Call MD for:  persistant dizziness or light-headedness   Complete by: As directed    Call MD for:  persistant nausea and vomiting   Complete by: As directed    Call MD for:  severe uncontrolled pain   Complete by: As directed    Call MD for:  temperature >100.4   Complete by: As directed    Diet - low sodium heart healthy   Complete by: As directed    Increase activity slowly   Complete by: As directed        Medication List    STOP taking these medications   Entresto 97-103 MG Generic drug: sacubitril-valsartan     TAKE these medications   albuterol 108 (90 Base) MCG/ACT inhaler Commonly known as: VENTOLIN HFA Inhale 1-2 puffs into the lungs every 6 (six) hours as needed for wheezing or shortness of breath.   aspirin EC 81 MG tablet Take 1 tablet (81 mg total) by mouth daily. Stop taking after  30 days. What changed: additional instructions   atorvastatin 80 MG tablet Commonly known as: LIPITOR Take 80 mg by mouth every evening.   B-12 PO Take 1 tablet by mouth daily.   carvedilol 12.5 MG tablet Commonly known as: COREG TAKE 1 AND 1/2 TABLETS BY MOUTH TWICE (2) DAILY What changed:   how much to take  how to take this  when to take this  additional instructions   cholecalciferol 25 MCG (1000 UNIT) tablet Commonly known as: VITAMIN D3 Take 1,000 Units by mouth daily.   clopidogrel 75 MG tablet Commonly known as: PLAVIX Take 1 tablet (75 mg total) by mouth daily with breakfast. Start taking on: October 27, 2020   [facility] 200-5 MCG/ACT Aero Generic drug: mometasone-formoterol Inhale 2 puffs into the lungs every evening.   Eliquis 5 MG Tabs tablet Generic drug: apixaban TAKE 1 TABLET BY MOUTH TWICE (2) DAILY What changed: See the new instructions.   montelukast 10 MG tablet Commonly known as: SINGULAIR Take 10 mg by mouth daily.   nitroGLYCERIN 0.4 MG SL tablet Commonly known as: NITROSTAT Place 0.4 mg under the tongue every 5 (five) minutes as needed for chest pain (max 3 doses).   omeprazole 40 MG capsule Commonly known as: PRILOSEC Take 40 mg by mouth daily.   QUERCETIN PO Take 1 tablet by mouth daily.   valsartan 80 MG tablet Commonly known as: DIOVAN Take 80 mg by mouth daily.   vitamin C 1000 MG tablet Take 2,000 mg by mouth daily.   zinc gluconate 50 MG tablet Take 50 mg by mouth daily.      Allergies  Allergen Reactions  . Pneumovax 23 [Pneumococcal Vac Polyvalent] Other (See Comments)    Some rash and muscle pain for 3 months       Procedures/Studies: DG Chest 2 View  Result Date: 10/24/2020 CLINICAL DATA:  69 year old male with left side chest pain, progressive. EXAM: CHEST - 2 VIEW COMPARISON:  Chest CTA 09/30/2018 and earlier. FINDINGS: Lung volumes and mediastinal contours remain within normal limits. Visualized tracheal  air column is within normal limits. Stable lungs with no pneumothorax, pulmonary edema or pleural effusion. Mild bibasilar  scarring or atelectasis is stable since 2018. No acute osseous abnormality identified. Negative visible bowel gas pattern. IMPRESSION: No acute cardiopulmonary abnormality. Electronically Signed   By: Odessa Fleming M.D.   On: 10/24/2020 18:47   CARDIAC CATHETERIZATION  Result Date: 10/25/2020  Previously placed Mid RCA stent (unknown type) is widely patent.  Previously placed Prox RCA-1 stent (unknown type) is widely patent.  Prox RCA-2 lesion is 30% stenosed.  Dist RCA lesion is 35% stenosed.  RPDA lesion is 60% stenosed.  1st RPL lesion is 80% stenosed.  Previously placed Mid Cx to Dist Cx stent (unknown type) is widely patent.  1st Mrg lesion is 95% stenosed.  2nd Mrg lesion is 50% stenosed.  3rd Mrg lesion is 40% stenosed.  Prox LAD to Mid LAD lesion is 95% stenosed.  A drug-eluting stent was successfully placed using a STENT RESOLUTE ONYX 3.5X15.  Post intervention, there is a 0% residual stenosis.  Dist LAD lesion is 99% stenosed.  Mid LAD to Dist LAD lesion is 50% stenosed.  LV end diastolic pressure is normal.  1. 3 vessel CAD    - the culprit lesion is the proximal LAD with 95% in stent restenosis.    - 99% occlusion of the distal LAD stent with good right to left collaterals from the RCA    - diffuse 95% stenosis in tiny OM1. Patent stent in the mid LCx    - Patent stents in the proximal and mid RCA    - 80% stenosis in a very small PL branch. 60% ostial PDA 2. Normal LVEDP 3. Successful PCI of the proximal LAD with IVUS guidance and DES x 1 4. Unable to cross the distal LAD occlusion with a wire. This portion of the vessel is well collateralized. Plan: DAPT with ASA for one month and Plavix for 12 months. May resume DOAC tomorrow. Plan to switch Eliquis to Xarelto due to formulary coverage. Anticipate DC in am.      Subjective: Patient seen this morning.  Denied  complaints.  Denied chest pain even with activity.  No dyspnea, dizziness or lightheadedness reported.  As per RN, no acute issues noted.  Discharge Exam:  Vitals:   10/25/20 1841 10/25/20 1920 10/25/20 2328 10/26/20 0507  BP: 121/86 116/80 110/78 113/83  Pulse: 77 73 65 69  Resp: Temp:  98.7 F (37.1 C) 98.3 F (36.8 C) 98.6 F (37 C)  TempSrc:  Oral Oral Oral  SpO2: 95% 98% 96%   Weight:    88.4 kg  Height:        General: Pt lying comfortably in bed & appears in no obvious distress. Cardiovascular: S1 & S2 heard, RRR, S1/S2 +. No murmurs, rubs, gallops or clicks. No JVD or pedal edema.  Telemetry personally reviewed: Sinus rhythm Respiratory: Clear to auscultation without wheezing, rhonchi or crackles. No increased work of breathing. Abdominal:  Non distended, non tender & soft. No organomegaly or masses appreciated. Normal bowel sounds heard. CNS: Alert and oriented. No focal deficits. Extremities: no edema, no cyanosis    The results of significant diagnostics from this hospitalization (including imaging, microbiology, ancillary and laboratory) are listed below for reference.     Microbiology: Recent Results (from the past 240 hour(s))  Resp Panel by RT-PCR (Flu A&B, Covid) Nasopharyngeal Swab     Status: None   Collection Time: 10/25/20  3:39 AM   Specimen: Nasopharyngeal Swab; Nasopharyngeal(NP) swabs in vial transport medium  Result Value Ref Range  Status   SARS Coronavirus 2 by RT PCR NEGATIVE NEGATIVE Final    Comment: (NOTE) SARS-CoV-2 target nucleic acids are NOT DETECTED.  The SARS-CoV-2 RNA is generally detectable in upper respiratory specimens during the acute phase of infection. The lowest concentration of SARS-CoV-2 viral copies this assay can detect is 138 copies/mL. A negative result does not preclude SARS-Cov-2 infection and should not be used as the sole basis for treatment or other patient management decisions. A negative result may  occur with  improper specimen collection/handling, submission of specimen other than nasopharyngeal swab, presence of viral mutation(s) within the areas targeted by this assay, and inadequate number of viral copies(<138 copies/mL). A negative result must be combined with clinical observations, patient history, and epidemiological information. The expected result is Negative.  Fact Sheet for Patients:  BloggerCourse.com  Fact Sheet for Healthcare Providers:  SeriousBroker.it  This test is no t yet approved or cleared by the [place] FDA and  has been authorized for detection and/or diagnosis of SARS-CoV-2 by FDA under an Emergency Use Authorization (EUA). This EUA will remain  in effect (meaning this test can be used) for the duration of the COVID-19 declaration under Section 564(b)(1) of the Act, 21 U.S.C.section 360bbb-3(b)(1), unless the authorization is terminated  or revoked sooner.       Influenza A by PCR NEGATIVE NEGATIVE Final   Influenza B by PCR NEGATIVE NEGATIVE Final    Comment: (NOTE) The Xpert Xpress SARS-CoV-2/FLU/RSV plus assay is intended as an aid in the diagnosis of influenza from Nasopharyngeal swab specimens and should not be used as a sole basis for treatment. Nasal washings and aspirates are unacceptable for Xpert Xpress SARS-CoV-2/FLU/RSV testing.  Fact Sheet for Patients: BloggerCourse.com  Fact Sheet for Healthcare Providers: SeriousBroker.it  This test is not yet approved or cleared by the [place] FDA and has been authorized for detection and/or diagnosis of SARS-CoV-2 by FDA under an Emergency Use Authorization (EUA). This EUA will remain in effect (meaning this test can be used) for the duration of the COVID-19 declaration under Section 564(b)(1) of the Act, 21 U.S.C. section 360bbb-3(b)(1), unless the authorization is terminated  or revoked.  Performed at [facility], [address], [place], [place] [address]      Labs: CBC: Recent Labs  Lab 10/24/20 1811 10/25/20 0840 10/26/20 0341  WBC 7.2 7.0 7.7  HGB 13.7 12.9* 12.5*  HCT 41.9 39.5 37.7*  MCV 93.1 91.6 90.4  PLT 194 176 173    Basic Metabolic Panel: Recent Labs  Lab 10/24/20 1811 10/25/20 0840 10/26/20 0341  NA 141 142 140  K 4.1 3.7 3.7  CL 107 108 108  CO2 GLUCOSE 108* 106* 106*  BUN CREATININE 1.21 1.16 1.27*  CALCIUM 9.1 9.0 8.5*  MG  --  1.8  --     Liver Function Tests: Recent Labs  Lab 10/25/20 0840  AST 20  ALT 24  ALKPHOS 97  BILITOT 0.7  PROT 5.8*  ALBUMIN 3.3*      Time coordinating discharge: 25 minutes  SIGNED:  Marcellus Scott, MD, FACP, [facility]. [facility]  To contact the attending provider between 7A-7P or the covering provider during after hours 7P-7A, please log into the web site [email or web address] and access using universal [place] password for that web site. If you do not have the password, please call the hospital operator.

## 2020-10-31 DIAGNOSIS — N1831 Chronic kidney disease, stage 3a: Secondary | ICD-10-CM | POA: Diagnosis not present

## 2020-10-31 DIAGNOSIS — I2510 Atherosclerotic heart disease of native coronary artery without angina pectoris: Secondary | ICD-10-CM | POA: Diagnosis not present

## 2020-10-31 DIAGNOSIS — I2782 Chronic pulmonary embolism: Secondary | ICD-10-CM | POA: Diagnosis not present

## 2020-11-03 ENCOUNTER — Telehealth (HOSPITAL_COMMUNITY): Payer: Self-pay

## 2020-11-03 NOTE — Telephone Encounter (Signed)
Cardiac rehab referral for Ph.II faxed to [facility]. 

## 2020-11-03 NOTE — Progress Notes (Signed)
Cardiology Office Note:    Date:  11/04/2020   ID:  Derek Hart, DOB 02/14/1951, MRN [id number]  PCP:  Gordan Payment., MD  Cardiologist:  Norman Herrlich, MD    Referring MD: Gordan Payment., MD    ASSESSMENT:    1. Coronary artery disease involving native coronary artery of native heart with angina pectoris (HCC)   2. Ischemic cardiomyopathy   3. Mixed hyperlipidemia   4. Otalgia of both ears    PLAN:    In order of problems listed above:  1. See discussion below stop aspirin at 30 days continue dual antithrombotic therapy clopidogrel and Eliquis.  He declines cardiac rehabilitation.  To reduce further risk of future events will intensify lipid-lowering therapy adding PCS canine inhibitor to his statin with residual LDL greater than 70. 2. Recheck echocardiogram did not have a ventriculogram in the hospital 3. Intensify lipid-lowering therapy adding PCSK9 inhibitor statin 4. At his request I will refer to ENT is having ear pain.   Next appointment: 3 months   Medication Adjustments/Labs and Tests Ordered: Current medicines are reviewed at length with the patient today.  Concerns regarding medicines are outlined above.  Orders Placed This Encounter  Procedures  . AMB Referral to [facility]  . Ambulatory referral to ENT  . ECHOCARDIOGRAM COMPLETE   Meds ordered this encounter  Medications  . nitroGLYCERIN (NITROSTAT) 0.4 MG SL tablet    Sig: Place 1 tablet (0.4 mg total) under the tongue every 5 (five) minutes as needed for chest pain (max 3 doses).    Dispense:  25 tablet    Refill:  0    Chief complaint: Follow-up hospitalization ACS recent PCI and stent LAD  History of Present Illness:    Derek Hart is a 69 y.o. male with a hx of CAD PCI of the LAD and right coronary artery and left circumflex in 2015 and LAD 01/2017 after non-STEMI he has had heart failure and LV dysfunction and has had recurrent unprovoked pulmonary embolism.  Last  ejection fraction 50 to 55%.  He was last seen by me 05/18/2020.  Compliance with diet, lifestyle and medications: Yes  He has done well having no angina dyspnea palpitation and tolerates triple therapy aspirin clopidogrel and anticoagulant without bleeding. He declines cardiac rehabilitation He is frustrated with recurrent cardiac events and I told him that I think he should be treated with a combination of atorvastatin and PCSK9 to achieve LDLs of 25-50 and will initiate him knowing that his recent LDL 81 on maximally tolerated high intensity statin.  He needs an echocardiogram as recommended by the discharging physician.  He had recent admission to [facility] 10/24/2020 discharge 10/26/2020 with non-ST elevation MI acute coronary syndrome.  Left heart catheterization showed previous stent right coronary artery proximal and mid patent without restenosis previous stent in the mid and distal left circumflex patent and a severe proximal LAD stenosis 95% with PCI and stent drug-eluting.  There was a distal LAD stenosis and the  guidewire would not cross and it was well collateralized.  He did not have a ventriculogram or echocardiogram performed  Coronary Diagrams valve 05/2020   Diagnostic Dominance: Right    Intervention    Component Ref Range & Units 9 d ago  (10/25/20) 10 d ago  (10/24/20) 10 d ago  (10/24/20)  Troponin I (High Sensitivity) <18 ng/L 159High Panic  94High CM  37High CM    I independently reviewed his EKG from  admission which showed sinus rhythm and anterior and lateral ischemic T wave inversion.  Current antithrombotic therapy includes both aspirin and clopidogrel along with his anticoagulant Eliquis with the expectation to drop aspirin after 30 days.  He was advised outpatient echocardiogram follow-up and LV function.  Hypertension was controlled he had no arrhythmia and was continued on a statin. Past Medical History:  Diagnosis Date  . Allergic  rhinitis 07/19/2016  . Asthma 07/19/2016  . Atherosclerotic heart disease of native coronary artery without angina pectoris 07/28/2015    CAD/ MI/ CABG  Overview:  Martine Trageser.  MI LAD stent. 01/2017 Overview:  He had MI with staged PCI of LAD and later RCA and LCF 2015, EF 40% Overview:  Added automatically from request for surgery [id number]  . Cardiomyopathy (HCC) 07/19/2016   Overview:  EF 45% or 54% 01/2017  . Chronic bronchitis (HCC)   . COPD (chronic obstructive pulmonary disease) (HCC) 04/26/2015   PFT-06/27/2015-minimal obstructive airways disease with insignificant response to bronchodilator, minimal diffusion defect, normal lung volumes. FEV1/FVC 0.76, DLCO 72%  . Coronary artery disease   . Emphysema lung (HCC)    "treated for asthma but later found out it was emphysema" (10/15/2017)  . Essential hypertension 07/19/2016  . GERD (gastroesophageal reflux disease)   . High risk medication use 07/19/2016  . Hyperlipidemia    "left lung"  . Inguinal hernia 07/19/2016   Overview:  Mild bilateral.  . Lung nodule seen on imaging study 07/28/2015   chest x-ray report from 03/05/2014 at [facility] described a vague nodular density in the left upper lobe and recommended follow-up CT scan which was never done.   . MI (myocardial infarction) (HCC) 12/2013; 01/2017  . 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 Prediabetes 07/19/2016  . Pulmonary embolism (HCC) 10/15/2017  . Stage 3 chronic kidney disease (HCC) 07/19/2016  . STEMI (ST elevation myocardial infarction) [facility]) 10/18/2017   Feb 2015    Past Surgical History:  Procedure Laterality Date  . CARDIAC CATHETERIZATION  11/2016  . CATARACT EXTRACTION W/ INTRAOCULAR LENS  IMPLANT, BILATERAL Bilateral   . CORONARY ANGIOPLASTY WITH STENT PLACEMENT  12/2013-02/2014   "2 + 3 stents"  . CORONARY ANGIOPLASTY WITH STENT PLACEMENT  01/2017   distal LAD  . CORONARY STENT INTERVENTION N/A 10/25/2020   Procedure: CORONARY STENT INTERVENTION;  Surgeon: Swaziland,  Peter M, MD;  Location: [facility] INVASIVE CV LAB;  Service: Cardiovascular;  Laterality: N/A;  . INTRAVASCULAR ULTRASOUND/IVUS N/A 10/25/2020   Procedure: Intravascular Ultrasound/IVUS;  Surgeon: Swaziland, Peter M, MD;  Location: [facility] INVASIVE CV LAB;  Service: Cardiovascular;  Laterality: N/A;  . KNEE ARTHROSCOPY Right 1982  . LEFT HEART CATH AND CORONARY ANGIOGRAPHY N/A 10/25/2020   Procedure: LEFT HEART CATH AND CORONARY ANGIOGRAPHY;  Surgeon: Swaziland, Peter M, MD;  Location: [facility] INVASIVE CV LAB;  Service: Cardiovascular;  Laterality: N/A;    Current Medications: Current Meds  Medication Sig  . albuterol (PROVENTIL HFA;VENTOLIN HFA) 108 (90 Base) MCG/ACT inhaler Inhale 1-2 puffs into the lungs every 6 (six) hours as needed for wheezing or shortness of breath.   . Ascorbic Acid (VITAMIN C) 1000 MG tablet Take 2,000 mg by mouth daily.  Marland Kitchen aspirin EC 81 MG tablet Take 1 tablet (81 mg total) by mouth daily. Stop taking after 30 days.  Marland Kitchen atorvastatin (LIPITOR) 80 MG tablet Take 80 mg by mouth every evening.   . carvedilol (COREG) 12.5 MG tablet TAKE 1 AND 1/2 TABLETS BY MOUTH TWICE (2) DAILY (Patient taking  differently: Take 18.75 mg by mouth 2 (two) times daily with a meal.)  . cholecalciferol (VITAMIN D3) 25 MCG (1000 UNIT) tablet Take 1,000 Units by mouth daily.  . clopidogrel (PLAVIX) 75 MG tablet Take 1 tablet (75 mg total) by mouth daily with breakfast.  . Cyanocobalamin (B-12 PO) Take 1 tablet by mouth daily.   Marland Kitchen ELIQUIS 5 MG TABS tablet TAKE 1 TABLET BY MOUTH TWICE (2) DAILY (Patient taking differently: Take 5 mg by mouth 2 (two) times daily.)  . mometasone-formoterol (DULERA) 200-5 MCG/ACT AERO Inhale 2 puffs into the lungs every evening.  . montelukast (SINGULAIR) 10 MG tablet Take 10 mg by mouth daily.  . nitroGLYCERIN (NITROSTAT) 0.4 MG SL tablet Place 1 tablet (0.4 mg total) under the tongue every 5 (five) minutes as needed for chest pain (max 3 doses).  Marland Kitchen omeprazole (PRILOSEC) 40 MG capsule Take  40 mg by mouth daily.  Marland Kitchen QUERCETIN PO Take 1 tablet by mouth daily.  . valsartan (DIOVAN) 80 MG tablet Take 80 mg by mouth daily.  Marland Kitchen zinc gluconate 50 MG tablet Take 50 mg by mouth daily.  . [DISCONTINUED] nitroGLYCERIN (NITROSTAT) 0.4 MG SL tablet Place 0.4 mg under the tongue every 5 (five) minutes as needed for chest pain (max 3 doses).      Allergies:   Pneumovax 23 [pneumococcal vac polyvalent]   Social History   Socioeconomic History  . Marital status: Married    Spouse name: Not on file  . Number of children: Not on file  . Years of education: Not on file  . Highest education level: Not on file  Occupational History  . Not on file  Tobacco Use  . Smoking status: Former Smoker    Packs/day: 1.00    Years: 15.00    Pack years: 15.00    Types: Cigarettes    Quit date: 11/19/1976    Years since quitting: 43.9  . Smokeless tobacco: Former Neurosurgeon    Types: Chew    Quit date: Orthoptist  . Vaping Use: Never used  Substance and Sexual Activity  . Alcohol use: No  . Drug use: No  . Sexual activity: Not Currently  Other Topics Concern  . Not on file  Social History Narrative  . Not on file   Social Determinants of Health   Financial Resource Strain: Not on file  Food Insecurity: Not on file  Transportation Needs: Not on file  Physical Activity: Not on file  Stress: Not on file  Social Connections: Not on file     Family History: The patient's family history includes Diabetes in his mother; Heart attack in his maternal grandmother and maternal uncle; Hyperlipidemia in his mother; Hypertension in his mother. ROS:   Please see the history of present illness.    All other systems reviewed and are negative.  EKGs/Labs/Other Studies Reviewed:    The following studies were reviewed today:   Recent Labs:  10/31/2020 LDL 81 cholesterol 129 triglycerides 54 HDL 39 11/18/2019: NT-Pro BNP 271 10/25/2020: ALT 24; Magnesium 1.8 10/26/2020: BUN 19; Creatinine, Ser  1.27; Hemoglobin 12.5; Platelets 173; Potassium 3.7; Sodium 140  Recent Lipid Panel    Component Value Date/Time   CHOL 157 11/18/2019 1158   TRIG 77 11/18/2019 1158   HDL 49 11/18/2019 1158   CHOLHDL 3.2 11/18/2019 1158   LDLCALC 93 11/18/2019 1158    Physical Exam:    VS:  BP 128/82   Pulse 72   Ht 6'  2" (1.88 m)   Wt 193 lb 1.3 oz (87.6 kg)   SpO2 96%   BMI 24.79 kg/m     Wt Readings from Last 3 Encounters:  11/04/20 193 lb 1.3 oz (87.6 kg)  10/26/20 194 lb 14.2 oz (88.4 kg)  05/18/20 192 lb 6.4 oz (87.3 kg)     GEN:  Well nourished, well developed in no acute distress HEENT: Normal NECK: No JVD; No carotid bruits LYMPHATICS: No lymphadenopathy CARDIAC: RRR, no murmurs, rubs, gallops RESPIRATORY:  Clear to auscultation without rales, wheezing or rhonchi  ABDOMEN: Soft, non-tender, non-distended MUSCULOSKELETAL:  No edema; No deformity  SKIN: Warm and dry NEUROLOGIC:  Alert and oriented x 3 PSYCHIATRIC:  Normal affect    Signed, Norman Herrlich, MD  11/04/2020 9:55 AM    [facility]

## 2020-11-04 ENCOUNTER — Other Ambulatory Visit: Payer: Self-pay

## 2020-11-04 ENCOUNTER — Ambulatory Visit (INDEPENDENT_AMBULATORY_CARE_PROVIDER_SITE_OTHER): Payer: PPO | Admitting: Cardiology

## 2020-11-04 ENCOUNTER — Encounter: Payer: Self-pay | Admitting: Cardiology

## 2020-11-04 VITALS — BP 128/82 | HR 72 | Ht 74.0 in | Wt 193.1 lb

## 2020-11-04 DIAGNOSIS — H9203 Otalgia, bilateral: Secondary | ICD-10-CM | POA: Diagnosis not present

## 2020-11-04 DIAGNOSIS — I25119 Atherosclerotic heart disease of native coronary artery with unspecified angina pectoris: Secondary | ICD-10-CM

## 2020-11-04 DIAGNOSIS — E782 Mixed hyperlipidemia: Secondary | ICD-10-CM | POA: Diagnosis not present

## 2020-11-04 DIAGNOSIS — I255 Ischemic cardiomyopathy: Secondary | ICD-10-CM

## 2020-11-04 MED ORDER — NITROGLYCERIN 0.4 MG SL SUBL: 0.4000 mg | SUBLINGUAL_TABLET | SUBLINGUAL | 0 refills | Status: DC | PRN

## 2020-11-04 NOTE — Patient Instructions (Signed)
Medication Instructions:  Your physician has recommended you make the following change in your medication:  STOP: Aspirin 81 mg after 30 days *If you need a refill on your cardiac medications before your next appointment, please call your pharmacy*   Lab Work: None If you have labs (blood work) drawn today and your tests are completely normal, you will receive your results only by: Marland Kitchen MyChart Message (if you have MyChart) OR . A paper copy in the mail If you have any lab test that is abnormal or we need to change your treatment, we will call you to review the results.   Testing/Procedures: Your physician has requested that you have an echocardiogram. Echocardiography is a painless test that uses sound waves to create images of your heart. It provides your doctor with information about the size and shape of your heart and how well your heart's chambers and valves are working. This procedure takes approximately one hour. There are no restrictions for this procedure.     Follow-Up: At [facility], you and your health needs are our priority.  As part of our continuing mission to provide you with exceptional heart care, we have created designated Provider Care Teams.  These Care Teams include your primary Cardiologist (physician) and Advanced Practice Providers (APPs -  Physician Assistants and Nurse Practitioners) who all work together to provide you with the care you need, when you need it.  We recommend signing up for the patient portal called "MyChart".  Sign up information is provided on this After Visit Summary.  MyChart is used to connect with patients for Virtual Visits (Telemedicine).  Patients are able to view lab/test results, encounter notes, upcoming appointments, etc.  Non-urgent messages can be sent to your provider as well.   To learn more about what you can do with MyChart, go to [email or web address].    Your next appointment:   3 month(s)  The format for your next  appointment:   In Person  Provider:   Norman Herrlich, MD   Other Instructions

## 2020-11-15 ENCOUNTER — Other Ambulatory Visit: Payer: Self-pay

## 2020-11-15 ENCOUNTER — Encounter: Payer: Self-pay | Admitting: Pharmacist

## 2020-11-15 ENCOUNTER — Telehealth: Payer: Self-pay | Admitting: Cardiology

## 2020-11-15 ENCOUNTER — Ambulatory Visit (INDEPENDENT_AMBULATORY_CARE_PROVIDER_SITE_OTHER): Payer: PPO | Admitting: Pharmacist

## 2020-11-15 ENCOUNTER — Telehealth: Payer: Self-pay | Admitting: Pharmacist

## 2020-11-15 DIAGNOSIS — E785 Hyperlipidemia, unspecified: Secondary | ICD-10-CM

## 2020-11-15 DIAGNOSIS — I2693 Single subsegmental pulmonary embolism without acute cor pulmonale: Secondary | ICD-10-CM | POA: Diagnosis not present

## 2020-11-15 MED ORDER — PRALUENT 75 MG/ML ~~LOC~~ SOAJ: 1.0000 mL | SUBCUTANEOUS | 1 refills | Status: DC

## 2020-11-15 MED ORDER — APIXABAN 5 MG PO TABS: ORAL_TABLET | ORAL | 1 refills | Status: DC

## 2020-11-15 MED ORDER — PRALUENT 75 MG/ML ~~LOC~~ SOAJ: 1.0000 mL | SUBCUTANEOUS | 1 refills | Status: AC

## 2020-11-15 NOTE — Telephone Encounter (Signed)
PA for Praluent approved through 11/15/2021.  Called patient and left message on machine

## 2020-11-15 NOTE — Telephone Encounter (Signed)
Spoke to the patient just now who let me know that I could send this prescription in to the [facility] for him. I sent it in for him at this time and he verbalizes understanding.    Encouraged patient to call back with any questions or concerns.

## 2020-11-15 NOTE — Telephone Encounter (Signed)
° ° °  Pt c/o medication issue:  1. Name of Medication:   Alirocumab (PRALUENT) 75 MG/ML SOAJ    2. How are you currently taking this medication (dosage and times per day)? Inject 1 mL into the skin every 14 (fourteen) days.  3. Are you having a reaction (difficulty breathing--STAT)?   4. What is your medication issue? Derek Hart with [facility] calling, she said they don't cary Praluent and they can't order it as well.

## 2020-11-15 NOTE — Progress Notes (Signed)
Patient ID: Derek Hart                 DOB: 1951-05-16                    MRN: [id number]     HPI: Other Derek Hart is a 69 y.o. male patient referred to lipid clinic by Dr Dulce Sellar. PMH is significant for CAD, HTN, STEMI, PE, cardiomyopathy, pre DM, and COPD.  Patient seen by Dr Dulce Sellar on 12/17 post NSTEMI on 12/6.  Had left heart cath which showed 95% stenosis in LAD.  He has expressed frustration at cardiac events such as unprovoked PE and PCI of LAD, right coronary artery and left circumflex.  Denied angina and declined cardiac rehab.  Dr Dulce Sellar recommended high dose statin and referral to lipid clinic to start PCSK9i.  Patient presents today in good spirits.  Reports he has had an MI about every 3 years.  Knows his grandparents both had histories of heart disease but does not think his parents did.  Is compliant with medications. Denies tobacco and alcohol.    Patient has not had lipid panel in over 1 year.  Last LDL 93 while on atorvastatin however this was in 2020.    Also mentioned he received a letter in the mail regarding his plan will not cover Eliquis in 2022.  Patient has a history of multiple PE.    Current Medications: atorvastatin 80mg  Intolerances: n/a Risk Factors: HTN, hx of STEMI, CAD, cardiomyopathy LDL goal: <55  Family History: grandmother and grandfather: MI   Social History: no smoking, no drinking  Labs:LDL 93, HDL 49, Trigs 77, VLDL 15, TC 157 (11/18/19, on atorvastatin 80)  Past Medical History:  Diagnosis Date  . Allergic rhinitis 07/19/2016  . Asthma 07/19/2016  . Atherosclerotic heart disease of native coronary artery without angina pectoris 07/28/2015    CAD/ MI/ CABG  Overview:  [place].  MI LAD stent. 01/2017 Overview:  He had MI with staged PCI of LAD and later RCA and LCF 2015, EF 40% Overview:  Added automatically from request for surgery 02/2017  . Cardiomyopathy (HCC) 07/19/2016   Overview:  EF 45% or 54% 01/2017  . Chronic bronchitis (HCC)   . COPD  (chronic obstructive pulmonary disease) (HCC) 04/26/2015   PFT-06/27/2015-minimal obstructive airways disease with insignificant response to bronchodilator, minimal diffusion defect, normal lung volumes. FEV1/FVC 0.76, DLCO 72%  . Coronary artery disease   . Emphysema lung (HCC)    "treated for asthma but later found out it was emphysema" (10/15/2017)  . Essential hypertension 07/19/2016  . GERD (gastroesophageal reflux disease)   . High risk medication use 07/19/2016  . Hyperlipidemia    "left lung"  . Inguinal hernia 07/19/2016   Overview:  Mild bilateral.  . Lung nodule seen on imaging study 07/28/2015   chest x-ray report from 03/05/2014 at [facility] described a vague nodular density in the left upper lobe and recommended follow-up CT scan which was never done.   . MI (myocardial infarction) (HCC) 12/2013; 01/2017  . 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 Prediabetes 07/19/2016  . Pulmonary embolism (HCC) 10/15/2017  . Stage 3 chronic kidney disease (HCC) 07/19/2016  . STEMI (ST elevation myocardial infarction) [facility]) 10/18/2017   Feb 2015    Current Outpatient Medications on File Prior to Visit  Medication Sig Dispense Refill  . albuterol (PROVENTIL HFA;VENTOLIN HFA) 108 (90 Base) MCG/ACT inhaler Inhale 1-2 puffs into the lungs every 6 (six) hours as  needed for wheezing or shortness of breath.     . Ascorbic Acid (VITAMIN C) 1000 MG tablet Take 2,000 mg by mouth daily.    Marland Kitchen aspirin EC 81 MG tablet Take 1 tablet (81 mg total) by mouth daily. Stop taking after 30 days. 30 tablet 0  . atorvastatin (LIPITOR) 80 MG tablet Take 80 mg by mouth every evening.   2  . carvedilol (COREG) 12.5 MG tablet TAKE 1 AND 1/2 TABLETS BY MOUTH TWICE (2) DAILY (Patient taking differently: Take 18.75 mg by mouth 2 (two) times daily with a meal.) 270 tablet 1  . cholecalciferol (VITAMIN D3) 25 MCG (1000 UNIT) tablet Take 1,000 Units by mouth daily.    . clopidogrel (PLAVIX) 75 MG tablet Take 1 tablet  (75 mg total) by mouth daily with breakfast. 30 tablet 1  . Cyanocobalamin (B-12 PO) Take 1 tablet by mouth daily.     Marland Kitchen ELIQUIS 5 MG TABS tablet TAKE 1 TABLET BY MOUTH TWICE (2) DAILY (Patient taking differently: Take 5 mg by mouth 2 (two) times daily.) 180 tablet 1  . mometasone-formoterol (DULERA) 200-5 MCG/ACT AERO Inhale 2 puffs into the lungs every evening.    . montelukast (SINGULAIR) 10 MG tablet Take 10 mg by mouth daily.  2  . nitroGLYCERIN (NITROSTAT) 0.4 MG SL tablet Place 1 tablet (0.4 mg total) under the tongue every 5 (five) minutes as needed for chest pain (max 3 doses). 25 tablet 0  . omeprazole (PRILOSEC) 40 MG capsule Take 40 mg by mouth daily.    Marland Kitchen QUERCETIN PO Take 1 tablet by mouth daily.    . valsartan (DIOVAN) 80 MG tablet Take 80 mg by mouth daily.    Marland Kitchen zinc gluconate 50 MG tablet Take 50 mg by mouth daily.     No current facility-administered medications on file prior to visit.    Allergies  Allergen Reactions  . Pneumovax 23 [Pneumococcal Vac Polyvalent] Other (See Comments)    Some rash and muscle pain for 3 months     Assessment/Plan:  1. Hyperlipidemia - Patient LDL 93 which is above goal of <55, however needs updated lipid panel.  Will place order for patient to have drawn before next appointment with Dr Dulce Sellar.  Due to patients history of multiple MIs, will require aggressive lipid lowering therapy.  Patient's plan prefers Praluent.  Using demo pen, educated patient on storage, site selection, and administration.  Patient was able to demonstrate use in room.  Printed copay card and will complete PA.  Lipid panel scheduled.  Continue atorvastatin 80mg  daily Start Praluent 75 mg SQ every 14 days  2. Hx of PE - Patients plan sent letter saying they will not cover Eliquis in 2022 and will switch to Xarelto.  Due to patient's history of clot on Xarelto, will send in Eliquis Rx to see if he can fill before 2022 and gave 10 dollar copay ard.  Will complete PA in  2022  Continue Eliquis 5mg  BID  2023, PharmD, BCACP, CDCES, CPP [facility] [address], [place], [address] [place] Phone: [phone]; Fax: [phone] 11/15/2020 10:14 AM

## 2020-11-15 NOTE — Patient Instructions (Signed)
It was nice meeting you today!  We would like your LDL (bad cholesterol) to be less than 55  Continue your atorvastatin 80 mg once daily  We will start you on a medication called Praluent which you will inject once every 2 weeks  Please have your cholesterol rechecked in March before your appointment with Dr Dulce Sellar  Try to focus your diet on vegetables, lean proteins, and whole grains  Please call with any questions!  Laural Golden, PharmD, BCACP, CDCES, CPP [facility] [address], [place], [place] [address] Phone: [phone]; Fax: [phone] 11/15/2020 9:53 AM

## 2020-11-18 DIAGNOSIS — J309 Allergic rhinitis, unspecified: Secondary | ICD-10-CM | POA: Diagnosis not present

## 2020-11-21 ENCOUNTER — Telehealth: Payer: Self-pay | Admitting: Pharmacist

## 2020-11-21 DIAGNOSIS — J45909 Unspecified asthma, uncomplicated: Secondary | ICD-10-CM | POA: Diagnosis not present

## 2020-11-21 DIAGNOSIS — Z8669 Personal history of other diseases of the nervous system and sense organs: Secondary | ICD-10-CM | POA: Diagnosis not present

## 2020-11-21 DIAGNOSIS — L299 Pruritus, unspecified: Secondary | ICD-10-CM | POA: Diagnosis not present

## 2020-11-21 NOTE — Telephone Encounter (Signed)
Patient called.  Praluent not available at [facility] or [facility].  Patient will call back when he finds a pharmacy that stocks it

## 2020-11-24 ENCOUNTER — Ambulatory Visit: Payer: PPO | Admitting: Cardiology

## 2020-12-01 ENCOUNTER — Telehealth: Payer: Self-pay

## 2020-12-01 NOTE — Telephone Encounter (Signed)
Called and lmome the pt that we need his signature to appeal eliquis

## 2020-12-02 ENCOUNTER — Telehealth: Payer: Self-pay

## 2020-12-02 ENCOUNTER — Other Ambulatory Visit (HOSPITAL_BASED_OUTPATIENT_CLINIC_OR_DEPARTMENT_OTHER): Payer: PPO

## 2020-12-02 NOTE — Telephone Encounter (Signed)
Pt assistance forms for Eliquis given along with a co-pay card and 4 boxes of samples. Lot #[id number] Exp 02/2022

## 2020-12-05 ENCOUNTER — Telehealth: Payer: Self-pay

## 2020-12-05 NOTE — Telephone Encounter (Signed)
PA started on CMM for Eliquis.

## 2020-12-05 NOTE — Telephone Encounter (Signed)
Key [id number]

## 2020-12-06 NOTE — Telephone Encounter (Signed)
Denied on CMM. You do not meet the requirements of your plan. Your plan covers this drug when your doctor submits documentation that you cannot use Xarelto

## 2020-12-07 NOTE — Telephone Encounter (Signed)
Patient will come in the AM to [place] office to sign form allowing [place] to appeal on his behalf. [place] office please fax back to [phone]

## 2020-12-14 ENCOUNTER — Ambulatory Visit (INDEPENDENT_AMBULATORY_CARE_PROVIDER_SITE_OTHER): Payer: PPO

## 2020-12-14 ENCOUNTER — Telehealth: Payer: Self-pay

## 2020-12-14 ENCOUNTER — Other Ambulatory Visit: Payer: Self-pay

## 2020-12-14 DIAGNOSIS — I255 Ischemic cardiomyopathy: Secondary | ICD-10-CM

## 2020-12-14 LAB — ECHOCARDIOGRAM COMPLETE
Area-P 1/2: 4.89 cm2
Calc EF: 53.7 %
S' Lateral: 3.5 cm
Single Plane A2C EF: 59.4 %
Single Plane A4C EF: 48.6 %

## 2020-12-14 NOTE — Progress Notes (Signed)
Complete echocardiogram performed.  Jimmy Emberlynn Riggan RDCS, RVT  

## 2020-12-14 NOTE — Telephone Encounter (Signed)
-----   Message from Baldo Daub, MD sent at 12/14/2020  1:29 PM EST ----- Overall a good report mild decrease in heart muscle function and should do well with his current medications.

## 2020-12-14 NOTE — Telephone Encounter (Signed)
The patient has been notified of the echo results and verbalized understanding.  All questions (if any) were answered. Elenora Gamma, RN 12/14/2020 1:38 PM

## 2020-12-19 ENCOUNTER — Telehealth: Payer: Self-pay | Admitting: Pharmacist

## 2020-12-19 NOTE — Telephone Encounter (Signed)
Called pt and left message advising him that we are still waiting to hear back from his insurance about Eliquis authorization. Does not look like he was using a copay card previously. I have activated one and mailed it to pt. Advised him this should still work at the pharmacy even if we are still waiting to hear back from his insurance. Left callback # in case pt had any questions.

## 2020-12-19 NOTE — Telephone Encounter (Signed)
Patient called back- advised him of the message below. He was appreciative of the help.

## 2021-01-03 ENCOUNTER — Telehealth: Payer: Self-pay | Admitting: Pharmacist

## 2021-01-03 NOTE — Telephone Encounter (Signed)
Level 1 appeals for Eliquis denied. Submitted level 2 external appeals today. Called pt to let him know, hopefully will hear back within the next few weeks. He was thankful for the update.

## 2021-01-06 ENCOUNTER — Telehealth: Payer: Self-pay | Admitting: Pharmacist

## 2021-01-06 DIAGNOSIS — I2693 Single subsegmental pulmonary embolism without acute cor pulmonale: Secondary | ICD-10-CM

## 2021-01-06 MED ORDER — APIXABAN 5 MG PO TABS: ORAL_TABLET | ORAL | 1 refills | Status: DC

## 2021-01-06 NOTE — Telephone Encounter (Signed)
Called pt to advise him that Eliquis has been added back to her insurance's formulary due to overwhelming demand from provider's offices, we are just waiting for the confirmation letter. His pharmacy can process Eliquis like normal the next time he is due for a refill. He was appreciative for assistance.

## 2021-01-26 ENCOUNTER — Other Ambulatory Visit: Payer: Self-pay | Admitting: Pharmacist

## 2021-01-26 DIAGNOSIS — E785 Hyperlipidemia, unspecified: Secondary | ICD-10-CM

## 2021-01-27 ENCOUNTER — Telehealth: Payer: Self-pay

## 2021-01-27 LAB — LIPID PANEL
Chol/HDL Ratio: 2.5 ratio (ref 0.0–5.0)
Cholesterol, Total: 98 mg/dL — ABNORMAL LOW (ref 100–199)
HDL: 39 mg/dL — ABNORMAL LOW (ref >39)
LDL Chol Calc (NIH): 44 mg/dL (ref 0–99)
Triglycerides: 70 mg/dL (ref 0–149)
VLDL Cholesterol Cal: 15 mg/dL (ref 5–40)

## 2021-01-27 NOTE — Telephone Encounter (Signed)
-----   Message from Baldo Daub, MD sent at 01/27/2021 11:36 AM EST ----- Great result LDL cholesterol is ideal continue same treatment

## 2021-01-27 NOTE — Telephone Encounter (Signed)
Spoke with patient regarding results and recommendation.  Patient verbalizes understanding and is agreeable to plan of care. Advised patient to call back with any issues or concerns.  

## 2021-02-05 NOTE — Progress Notes (Unsigned)
Cardiology Office Note:    Date:  02/06/2021   ID:  Derek Hart, DOB January 18, 1951, MRN [id number]  PCP:  Gordan Payment., MD  Cardiologist:  Norman Herrlich, MD    Referring MD: Gordan Payment., MD    ASSESSMENT:    1. Coronary artery disease involving native coronary artery of native heart with angina pectoris (HCC)   2. Hypertensive heart disease with chronic combined systolic and diastolic congestive heart failure (HCC)   3. Mixed hyperlipidemia   4. Chronic anticoagulation   5. Single subsegmental pulmonary embolism without acute cor pulmonale (HCC)    PLAN:    In order of problems listed above:  1. Stable CAD having no anginal discomfort continue current therapy including combined anticoagulant antiplatelet for 1 year following his most recent PCI and stent 10/26/2020 and combined lipid-lowering therapy.  New York Heart Association class I currently having no angina. 2. Stable continue guideline directed therapy carvedilol valsartan.  Most recent EF mildly reduced 45 to 50% 3. Ideal lipids continue combined therapy with repeated ischemic events over time   Next appointment: 6 months   Medication Adjustments/Labs and Tests Ordered: Current medicines are reviewed at length with the patient today.  Concerns regarding medicines are outlined above.  No orders of the defined types were placed in this encounter.  Meds ordered this encounter  Medications  . DISCONTD: apixaban (ELIQUIS) 5 MG TABS tablet    Sig: TAKE 1 TABLET BY MOUTH TWICE (2) DAILY    Dispense:  180 tablet    Refill:  3  . carvedilol (COREG) 12.5 MG tablet    Sig: Take 1.5 tablets (18.75 mg total) by mouth 2 (two) times daily with a meal.    Dispense:  270 tablet    Refill:  3  . clopidogrel (PLAVIX) 75 MG tablet    Sig: Take 1 tablet (75 mg total) by mouth daily with breakfast.    Dispense:  90 tablet    Refill:  3  . valsartan (DIOVAN) 80 MG tablet    Sig: Take 1 tablet (80 mg total) by mouth daily.     Dispense:  90 tablet    Refill:  3  . atorvastatin (LIPITOR) 80 MG tablet    Sig: Take 1 tablet (80 mg total) by mouth every evening.    Dispense:  90 tablet    Refill:  3  . apixaban (ELIQUIS) 5 MG TABS tablet    Sig: TAKE 1 TABLET BY MOUTH TWICE (2) DAILY    Dispense:  180 tablet    Refill:  3    Chief Complaint  Patient presents with  . Follow-up  . Coronary Artery Disease    History of Present Illness:    Derek Hart is a 70 y.o. male with a hx of CAD PCI of the LAD and right coronary artery and left circumflex in 2015 and LAD 01/2017 after non-STEMI he has had heart failure and LV dysfunction and has had recurrent unprovoked pulmonary embolism  last seen 11/04/2020.  He had recent admission to [facility] 10/24/2020 discharge 10/26/2020 with non-ST elevation MI acute coronary syndrome.  Left heart catheterization showed previous stent right coronary artery proximal and mid patent without restenosis previous stent in the mid and distal left circumflex patent and a severe proximal LAD stenosis 95% with PCI and stent drug-eluting.  Most recent ejection fraction 12/14/2020 was 45 to 50%. There was a distal LAD stenosis and the  guidewire would not cross and it  was well collateralized.  He is maintained on combined anticoagulant antiplatelet Eliquis and clopidogrel and combined statin Repatha treatment.  Coronary Diagrams valve 10/25/2020     Diagnostic Dominance: Right      Intervention          Compliance with diet, lifestyle and medications: Yes  Derek Hart continues to do well having no angina edema shortness of breath chest pain palpitation or syncope. He tolerates combined anticoagulant antiplatelet without bleeding He tolerates combined statin and PCSK9 therapy without muscle pain or weakness. Past Medical History:  Diagnosis Date  . Allergic rhinitis 07/19/2016  . Asthma 07/19/2016  . Atherosclerotic heart disease of native coronary artery without angina  pectoris 07/28/2015    CAD/ MI/ CABG  Overview:  [place].  MI LAD stent. 01/2017 Overview:  He had MI with staged PCI of LAD and later RCA and LCF 2015, EF 40% Overview:  Added automatically from request for surgery [id number]  . Cardiomyopathy (HCC) 07/19/2016   Overview:  EF 45% or 54% 01/2017  . Chronic bronchitis (HCC)   . COPD (chronic obstructive pulmonary disease) (HCC) 04/26/2015   PFT-06/27/2015-minimal obstructive airways disease with insignificant response to bronchodilator, minimal diffusion defect, normal lung volumes. FEV1/FVC 0.76, DLCO 72%  . Coronary artery disease   . Emphysema lung (HCC)    "treated for asthma but later found out it was emphysema" (10/15/2017)  . Essential hypertension 07/19/2016  . GERD (gastroesophageal reflux disease)   . High risk medication use 07/19/2016  . Hyperlipidemia    "left lung"  . Inguinal hernia 07/19/2016   Overview:  Mild bilateral.  . Lung nodule seen on imaging study 07/28/2015   chest x-ray report from 03/05/2014 at [facility] described a vague nodular density in the left upper lobe and recommended follow-up CT scan which was never done.   . MI (myocardial infarction) (HCC) 12/2013; 01/2017  . 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 Prediabetes 07/19/2016  . Pulmonary embolism (HCC) 10/15/2017  . Stage 3 chronic kidney disease (HCC) 07/19/2016  . STEMI (ST elevation myocardial infarction) [facility]) 10/18/2017   Feb 2015    Past Surgical History:  Procedure Laterality Date  . CARDIAC CATHETERIZATION  11/2016  . CATARACT EXTRACTION W/ INTRAOCULAR LENS  IMPLANT, BILATERAL Bilateral   . CORONARY ANGIOPLASTY WITH STENT PLACEMENT  12/2013-02/2014   "2 + 3 stents"  . CORONARY ANGIOPLASTY WITH STENT PLACEMENT  01/2017   distal LAD  . CORONARY STENT INTERVENTION N/A 10/25/2020   Procedure: CORONARY STENT INTERVENTION;  Surgeon: Swaziland, Peter M, MD;  Location: [facility] INVASIVE CV LAB;  Service: Cardiovascular;  Laterality: N/A;  . INTRAVASCULAR  ULTRASOUND/IVUS N/A 10/25/2020   Procedure: Intravascular Ultrasound/IVUS;  Surgeon: Swaziland, Peter M, MD;  Location: [facility] INVASIVE CV LAB;  Service: Cardiovascular;  Laterality: N/A;  . KNEE ARTHROSCOPY Right 1982  . LEFT HEART CATH AND CORONARY ANGIOGRAPHY N/A 10/25/2020   Procedure: LEFT HEART CATH AND CORONARY ANGIOGRAPHY;  Surgeon: Swaziland, Peter M, MD;  Location: [facility] INVASIVE CV LAB;  Service: Cardiovascular;  Laterality: N/A;    Current Medications: Current Meds  Medication Sig  . albuterol (PROVENTIL HFA;VENTOLIN HFA) 108 (90 Base) MCG/ACT inhaler Inhale 1-2 puffs into the lungs every 6 (six) hours as needed for wheezing or shortness of breath.   . Alirocumab (PRALUENT) 75 MG/ML SOAJ Inject 1 mL into the skin every 14 (fourteen) days.  . Ascorbic Acid (VITAMIN C) 1000 MG tablet Take 2,000 mg by mouth daily.  . cholecalciferol (VITAMIN D3) 25 MCG (1000  UNIT) tablet Take 1,000 Units by mouth daily.  . Cyanocobalamin (B-12 PO) Take 1 tablet by mouth daily.   . mometasone-formoterol (DULERA) 200-5 MCG/ACT AERO Inhale 2 puffs into the lungs every evening.  . montelukast (SINGULAIR) 10 MG tablet Take 10 mg by mouth daily.  . nitroGLYCERIN (NITROSTAT) 0.4 MG SL tablet Place 1 tablet (0.4 mg total) under the tongue every 5 (five) minutes as needed for chest pain (max 3 doses).  Marland Kitchen omeprazole (PRILOSEC) 40 MG capsule Take 40 mg by mouth daily.  Marland Kitchen QUERCETIN PO Take 1 tablet by mouth daily.  Marland Kitchen zinc gluconate 50 MG tablet Take 50 mg by mouth daily.  . [DISCONTINUED] apixaban (ELIQUIS) 5 MG TABS tablet TAKE 1 TABLET BY MOUTH TWICE (2) DAILY  . [DISCONTINUED] atorvastatin (LIPITOR) 80 MG tablet Take 80 mg by mouth every evening.   . [DISCONTINUED] carvedilol (COREG) 12.5 MG tablet TAKE 1 AND 1/2 TABLETS BY MOUTH TWICE (2) DAILY (Patient taking differently: Take 18.75 mg by mouth 2 (two) times daily with a meal.)  . [DISCONTINUED] clopidogrel (PLAVIX) 75 MG tablet Take 1 tablet (75 mg total) by mouth daily  with breakfast.  . [DISCONTINUED] valsartan (DIOVAN) 80 MG tablet Take 80 mg by mouth daily.     Allergies:   Pneumovax 23 [pneumococcal vac polyvalent]   Social History   Socioeconomic History  . Marital status: Married    Spouse name: Not on file  . Number of children: Not on file  . Years of education: Not on file  . Highest education level: Not on file  Occupational History  . Not on file  Tobacco Use  . Smoking status: Former Smoker    Packs/day: 1.00    Years: 15.00    Pack years: 15.00    Types: Cigarettes    Quit date: 11/19/1976    Years since quitting: 44.2  . Smokeless tobacco: Former Neurosurgeon    Types: Chew    Quit date: Orthoptist  . Vaping Use: Never used  Substance and Sexual Activity  . Alcohol use: No  . Drug use: No  . Sexual activity: Not Currently  Other Topics Concern  . Not on file  Social History Narrative  . Not on file   Social Determinants of Health   Financial Resource Strain: Not on file  Food Insecurity: Not on file  Transportation Needs: Not on file  Physical Activity: Not on file  Stress: Not on file  Social Connections: Not on file     Family History: The patient's family history includes Diabetes in his mother; Heart attack in his maternal grandmother and maternal uncle; Hyperlipidemia in his mother; Hypertension in his mother. ROS:   Please see the history of present illness.    All other systems reviewed and are negative.  EKGs/Labs/Other Studies Reviewed:    The following studies were reviewed today:   Recent Labs: 10/25/2020: ALT 24; Magnesium 1.8 10/26/2020: BUN 19; Creatinine, Ser 1.27; Hemoglobin 12.5; Platelets 173; Potassium 3.7; Sodium 140  Recent Lipid Panel    Component Value Date/Time   CHOL 98 (L) 01/26/2021 1138   TRIG 70 01/26/2021 1138   HDL 39 (L) 01/26/2021 1138   CHOLHDL 2.5 01/26/2021 1138   LDLCALC 44 01/26/2021 1138    Physical Exam:    VS:  BP 126/80   Pulse 64   Ht 6\' 2"  (1.88 m)   Wt  193 lb 3.2 oz (87.6 kg)   SpO2 95%   BMI 24.81  kg/m     Wt Readings from Last 3 Encounters:  02/06/21 193 lb 3.2 oz (87.6 kg)  11/04/20 193 lb 1.3 oz (87.6 kg)  10/26/20 194 lb 14.2 oz (88.4 kg)     GEN:  Well nourished, well developed in no acute distress HEENT: Normal NECK: No JVD; No carotid bruits LYMPHATICS: No lymphadenopathy CARDIAC: RRR, no murmurs, rubs, gallops RESPIRATORY:  Clear to auscultation without rales, wheezing or rhonchi  ABDOMEN: Soft, non-tender, non-distended MUSCULOSKELETAL:  No edema; No deformity  SKIN: Warm and dry NEUROLOGIC:  Alert and oriented x 3 PSYCHIATRIC:  Normal affect    Signed, Norman Herrlich, MD  02/06/2021 8:36 AM    [facility]

## 2021-02-06 ENCOUNTER — Encounter: Payer: Self-pay | Admitting: Cardiology

## 2021-02-06 ENCOUNTER — Other Ambulatory Visit: Payer: Self-pay

## 2021-02-06 ENCOUNTER — Telehealth: Payer: Self-pay | Admitting: Cardiology

## 2021-02-06 ENCOUNTER — Ambulatory Visit (INDEPENDENT_AMBULATORY_CARE_PROVIDER_SITE_OTHER): Payer: PPO | Admitting: Cardiology

## 2021-02-06 VITALS — BP 126/80 | HR 64 | Ht 74.0 in | Wt 193.2 lb

## 2021-02-06 DIAGNOSIS — I25119 Atherosclerotic heart disease of native coronary artery with unspecified angina pectoris: Secondary | ICD-10-CM | POA: Diagnosis not present

## 2021-02-06 DIAGNOSIS — I5042 Chronic combined systolic (congestive) and diastolic (congestive) heart failure: Secondary | ICD-10-CM | POA: Diagnosis not present

## 2021-02-06 DIAGNOSIS — E782 Mixed hyperlipidemia: Secondary | ICD-10-CM

## 2021-02-06 DIAGNOSIS — I2693 Single subsegmental pulmonary embolism without acute cor pulmonale: Secondary | ICD-10-CM | POA: Diagnosis not present

## 2021-02-06 DIAGNOSIS — I110 Hypertensive heart disease with heart failure: Secondary | ICD-10-CM | POA: Diagnosis not present

## 2021-02-06 DIAGNOSIS — Z7901 Long term (current) use of anticoagulants: Secondary | ICD-10-CM | POA: Diagnosis not present

## 2021-02-06 MED ORDER — APIXABAN 5 MG PO TABS: ORAL_TABLET | ORAL | 3 refills | Status: DC

## 2021-02-06 MED ORDER — CLOPIDOGREL BISULFATE 75 MG PO TABS: 75.0000 mg | ORAL_TABLET | Freq: Every day | ORAL | 3 refills | Status: AC

## 2021-02-06 MED ORDER — VALSARTAN 80 MG PO TABS: 80.0000 mg | ORAL_TABLET | Freq: Every day | ORAL | 3 refills | Status: DC

## 2021-02-06 MED ORDER — ATORVASTATIN CALCIUM 80 MG PO TABS: 80.0000 mg | ORAL_TABLET | Freq: Every evening | ORAL | 3 refills | Status: AC

## 2021-02-06 MED ORDER — CARVEDILOL 12.5 MG PO TABS: 18.7500 mg | ORAL_TABLET | Freq: Two times a day (BID) | ORAL | 3 refills | Status: AC

## 2021-02-06 NOTE — Patient Instructions (Signed)

## 2021-02-06 NOTE — Telephone Encounter (Signed)
Pt c/o medication issue:  1. Name of Medication:  apixaban (ELIQUIS) 5 MG TABS tablet clopidogrel (PLAVIX) 75 MG tablet  2. How are you currently taking this medication (dosage and times per day)?   3. Are you having a reaction (difficulty breathing--STAT)?   4. What is your medication issue? Pharmacy is calling stating they received prescription request for these two medications. She is wanting to confirm if the patient is needing to be on both of these blood thinners. Please advise.

## 2021-02-06 NOTE — Telephone Encounter (Signed)
Spoke to the pharmacy just now and let them know that this was correct per Dr. Hulen Shouts office visit notes. They verbalize understanding and thank me for the call back.

## 2021-03-02 DIAGNOSIS — H6507 Acute serous otitis media, recurrent, unspecified ear: Secondary | ICD-10-CM | POA: Diagnosis not present

## 2021-03-07 DIAGNOSIS — Z87891 Personal history of nicotine dependence: Secondary | ICD-10-CM | POA: Diagnosis not present

## 2021-03-07 DIAGNOSIS — R7303 Prediabetes: Secondary | ICD-10-CM | POA: Diagnosis not present

## 2021-03-07 DIAGNOSIS — J4520 Mild intermittent asthma, uncomplicated: Secondary | ICD-10-CM | POA: Diagnosis not present

## 2021-03-07 DIAGNOSIS — I255 Ischemic cardiomyopathy: Secondary | ICD-10-CM | POA: Diagnosis not present

## 2021-03-07 DIAGNOSIS — I10 Essential (primary) hypertension: Secondary | ICD-10-CM | POA: Diagnosis not present

## 2021-03-07 DIAGNOSIS — I129 Hypertensive chronic kidney disease with stage 1 through stage 4 chronic kidney disease, or unspecified chronic kidney disease: Secondary | ICD-10-CM | POA: Diagnosis not present

## 2021-03-07 DIAGNOSIS — I2510 Atherosclerotic heart disease of native coronary artery without angina pectoris: Secondary | ICD-10-CM | POA: Diagnosis not present

## 2021-03-07 DIAGNOSIS — N1831 Chronic kidney disease, stage 3a: Secondary | ICD-10-CM | POA: Diagnosis not present

## 2021-03-07 DIAGNOSIS — I2782 Chronic pulmonary embolism: Secondary | ICD-10-CM | POA: Diagnosis not present

## 2021-03-07 DIAGNOSIS — E782 Mixed hyperlipidemia: Secondary | ICD-10-CM | POA: Diagnosis not present

## 2021-03-09 DIAGNOSIS — D638 Anemia in other chronic diseases classified elsewhere: Secondary | ICD-10-CM | POA: Insufficient documentation

## 2021-03-09 HISTORY — DX: Anemia in other chronic diseases classified elsewhere: D63.8

## 2021-03-31 DIAGNOSIS — H61321 Acquired stenosis of right external ear canal secondary to inflammation and infection: Secondary | ICD-10-CM | POA: Diagnosis not present

## 2021-03-31 DIAGNOSIS — H6091 Unspecified otitis externa, right ear: Secondary | ICD-10-CM | POA: Diagnosis not present

## 2021-04-03 DIAGNOSIS — R748 Abnormal levels of other serum enzymes: Secondary | ICD-10-CM | POA: Diagnosis not present

## 2021-04-03 DIAGNOSIS — D649 Anemia, unspecified: Secondary | ICD-10-CM | POA: Diagnosis not present

## 2021-04-06 DIAGNOSIS — R748 Abnormal levels of other serum enzymes: Secondary | ICD-10-CM

## 2021-04-06 HISTORY — DX: Abnormal levels of other serum enzymes: R74.8

## 2021-04-07 DIAGNOSIS — H6091 Unspecified otitis externa, right ear: Secondary | ICD-10-CM | POA: Diagnosis not present

## 2021-04-12 DIAGNOSIS — R748 Abnormal levels of other serum enzymes: Secondary | ICD-10-CM | POA: Diagnosis not present

## 2021-04-12 DIAGNOSIS — K7689 Other specified diseases of liver: Secondary | ICD-10-CM | POA: Diagnosis not present

## 2021-05-01 DIAGNOSIS — I2510 Atherosclerotic heart disease of native coronary artery without angina pectoris: Secondary | ICD-10-CM | POA: Diagnosis not present

## 2021-05-01 DIAGNOSIS — K7400 Hepatic fibrosis, unspecified: Secondary | ICD-10-CM | POA: Diagnosis not present

## 2021-05-01 DIAGNOSIS — D649 Anemia, unspecified: Secondary | ICD-10-CM | POA: Diagnosis not present

## 2021-05-03 DIAGNOSIS — Z20828 Contact with and (suspected) exposure to other viral communicable diseases: Secondary | ICD-10-CM | POA: Diagnosis not present

## 2021-05-03 DIAGNOSIS — J209 Acute bronchitis, unspecified: Secondary | ICD-10-CM | POA: Diagnosis not present

## 2021-05-04 ENCOUNTER — Other Ambulatory Visit: Payer: Self-pay | Admitting: *Deleted

## 2021-05-04 MED ORDER — PRALUENT 75 MG/ML ~~LOC~~ SOAJ: 75.0000 mg | SUBCUTANEOUS | 5 refills | Status: DC

## 2021-05-04 NOTE — Telephone Encounter (Signed)
Rx refill sent to pharmacy. 

## 2021-07-19 DIAGNOSIS — J9801 Acute bronchospasm: Secondary | ICD-10-CM | POA: Diagnosis not present

## 2021-07-19 DIAGNOSIS — R051 Acute cough: Secondary | ICD-10-CM | POA: Diagnosis not present

## 2021-08-15 NOTE — Progress Notes (Signed)
Cardiology Office Note:    Date:  08/16/2021   ID:  Derek Hart, DOB 06-16-1951, MRN [id number]  PCP:  Gordan Payment., MD  Cardiologist:  Norman Herrlich, MD    Referring MD: Gordan Payment., MD    ASSESSMENT:    1. Coronary artery disease involving native coronary artery of native heart with angina pectoris (HCC)   2. Mixed hyperlipidemia   3. Hypertensive heart disease with chronic combined systolic and diastolic congestive heart failure (HCC)   4. Chronic anticoagulation    PLAN:    In order of problems listed above:  CAD having no anginal discomfort continue current treatment including his anticoagulant beta-blocker and lipid-lowering with statin.  He perceives intolerance to his PCSK9 inhibitor.  At this time I would not advise an ischemia evaluation Continue his statin Stable blood pressure repeated by me 140/80 I asked him to trend his blood pressures at home and let us know if he is consistently greater than 140 and continue his valsartan. Continues anticoagulant with recurrent pulmonary embolism along with clopidogrel     Next appointment: 9 months   Medication Adjustments/Labs and Tests Ordered: Current medicines are reviewed at length with the patient today.  Concerns regarding medicines are outlined above.  No orders of the defined types were placed in this encounter.  No orders of the defined types were placed in this encounter.   Chief Complaint  Patient presents with   Follow-up   Coronary Artery Disease   Hyperlipidemia    History of Present Illness:    Derek Hart is a 70 y.o. male with a hx of CAD PCI of the LAD and right coronary artery left circumflex 2015 LAD March 2018 after non-ST elevation MI in 10/26/2020 with recurrent acute coronary syndrome with severe proximal LAD stenosis with PCI and stent he has had previous severe left ventricular dysfunction and cardiomyopathy most recent ejection fraction 45 to 50% 02/02/2017.  He was last seen  02/07/2020.  Compliance with diet, lifestyle and medications: Yes, however when he stopped taking his PCSK9 inhibitor he attributed anemia and liver disease to this point in time does not want additional except lipid-lowering treatment. Overall pleased with the quality of his life having no angina shortness of breath chest pain palpitation or syncope.  Recently seen with Dr. Shary Decamp . PCP 05/01/2021 with anemia abnormal liver function test and ultrasound of the liver showing fibrosis. Recent labs 05/01/2021 hemoglobin 14.3 MCV normal 86.3 platelets normal 215,000.  Liver function test showed a mildly elevated alkaline phosphatase 130 and total protein mildly diminished at 6.2 Past Medical History:  Diagnosis Date   Allergic rhinitis 07/19/2016   Asthma 07/19/2016   Atherosclerotic heart disease of native coronary artery without angina pectoris 07/28/2015    CAD/ MI/ CABG  Overview:  Husain Costabile.  MI LAD stent. 01/2017 Overview:  He had MI with staged PCI of LAD and later RCA and LCF 2015, EF 40% Overview:  Added automatically from request for surgery [id number]   Cardiomyopathy [facility]) 07/19/2016   Overview:  EF 45% or 54% 01/2017   Chronic bronchitis (HCC)    COPD (chronic obstructive pulmonary disease) (HCC) 04/26/2015   PFT-06/27/2015-minimal obstructive airways disease with insignificant response to bronchodilator, minimal diffusion defect, normal lung volumes. FEV1/FVC 0.76, DLCO 72%   Coronary artery disease    Emphysema lung (HCC)    "treated for asthma but later found out it was emphysema" (10/15/2017)   Essential hypertension 07/19/2016   GERD (gastroesophageal reflux disease)  High risk medication use 07/19/2016   Hyperlipidemia    "left lung"   Inguinal hernia 07/19/2016   Overview:  Mild bilateral.   Lung nodule seen on imaging study 07/28/2015   chest x-ray report from 03/05/2014 at [facility] described a vague nodular density in the left upper lobe and recommended follow-up CT scan which  was never done.    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   Prediabetes 07/19/2016   Pulmonary embolism (HCC) 10/15/2017   Stage 3 chronic kidney disease (HCC) 07/19/2016   STEMI (ST elevation myocardial infarction) [facility]) 10/18/2017   Feb 2015    Past Surgical History:  Procedure Laterality Date   CARDIAC CATHETERIZATION  11/2016   CATARACT EXTRACTION W/ INTRAOCULAR LENS  IMPLANT, BILATERAL Bilateral    CORONARY ANGIOPLASTY WITH STENT PLACEMENT  12/2013-02/2014   "2 + 3 stents"   CORONARY ANGIOPLASTY WITH STENT PLACEMENT  01/2017   distal LAD   CORONARY STENT INTERVENTION N/A 10/25/2020   Procedure: CORONARY STENT INTERVENTION;  Surgeon: Swaziland, Peter M, MD;  Location: [facility] INVASIVE CV LAB;  Service: Cardiovascular;  Laterality: N/A;   INTRAVASCULAR ULTRASOUND/IVUS N/A 10/25/2020   Procedure: Intravascular Ultrasound/IVUS;  Surgeon: Swaziland, Peter M, MD;  Location: [facility] INVASIVE CV LAB;  Service: Cardiovascular;  Laterality: N/A;   KNEE ARTHROSCOPY Right 1982   LEFT HEART CATH AND CORONARY ANGIOGRAPHY N/A 10/25/2020   Procedure: LEFT HEART CATH AND CORONARY ANGIOGRAPHY;  Surgeon: Swaziland, Peter M, MD;  Location: [facility] INVASIVE CV LAB;  Service: Cardiovascular;  Laterality: N/A;    Current Medications: Current Meds  Medication Sig   albuterol (PROVENTIL HFA;VENTOLIN HFA) 108 (90 Base) MCG/ACT inhaler Inhale 1-2 puffs into the lungs every 6 (six) hours as needed for wheezing or shortness of breath.    Alirocumab (PRALUENT) 75 MG/ML SOAJ Inject 75 mg into the skin every 14 (fourteen) days.   apixaban (ELIQUIS) 5 MG TABS tablet TAKE 1 TABLET BY MOUTH TWICE (2) DAILY   Ascorbic Acid (VITAMIN C) 1000 MG tablet Take 2,000 mg by mouth daily.   atorvastatin (LIPITOR) 80 MG tablet Take 1 tablet (80 mg total) by mouth every evening.   carvedilol (COREG) 12.5 MG tablet Take 1.5 tablets (18.75 mg total) by mouth 2 (two) times daily with a meal.    cholecalciferol (VITAMIN D3) 25 MCG (1000 UNIT) tablet Take 1,000 Units by mouth daily.   clopidogrel (PLAVIX) 75 MG tablet Take 1 tablet (75 mg total) by mouth daily with breakfast.   Cyanocobalamin (B-12 PO) Take 1 tablet by mouth daily.    mometasone-formoterol (DULERA) 200-5 MCG/ACT AERO Inhale 2 puffs into the lungs every evening.   montelukast (SINGULAIR) 10 MG tablet Take 10 mg by mouth daily.   nitroGLYCERIN (NITROSTAT) 0.4 MG SL tablet Place 1 tablet (0.4 mg total) under the tongue every 5 (five) minutes as needed for chest pain (max 3 doses).   omeprazole (PRILOSEC) 40 MG capsule Take 40 mg by mouth daily.   QUERCETIN PO Take 1 tablet by mouth daily.   valsartan (DIOVAN) 80 MG tablet Take 1 tablet (80 mg total) by mouth daily.   zinc gluconate 50 MG tablet Take 50 mg by mouth daily.     Allergies:   Pneumovax 23 [pneumococcal vac polyvalent]   Social History   Socioeconomic History   Marital status: Married    Spouse name: Not on file   Number of children: Not on file   Years of education: Not  on file   Highest education level: Not on file  Occupational History   Not on file  Tobacco Use   Smoking status: Former    Packs/day: 1.00    Years: 15.00    Pack years: 15.00    Types: Cigarettes    Quit date: 11/19/1976    Years since quitting: 44.7   Smokeless tobacco: Former    Types: Chew    Quit date: 1980  Vaping Use   Vaping Use: Never used  Substance and Sexual Activity   Alcohol use: No   Drug use: No   Sexual activity: Not Currently  Other Topics Concern   Not on file  Social History Narrative   Not on file   Social Determinants of Health   Financial Resource Strain: Not on file  Food Insecurity: Not on file  Transportation Needs: Not on file  Physical Activity: Not on file  Stress: Not on file  Social Connections: Not on file     Family History: The patient's chart family history includes Diabetes in his mother; Heart attack in his maternal  grandmother and maternal uncle; Hyperlipidemia in his mother; Hypertension in his mother. ROS:   Please see the history of present illness.    All other systems reviewed and are negative.  EKGs/Labs/Other Studies Reviewed:    The following studies were reviewed today:    Recent Labs: 10/25/2020: ALT 24; Magnesium 1.8 10/26/2020: BUN 19; Creatinine, Ser 1.27; Hemoglobin 12.5; Platelets 173; Potassium 3.7; Sodium 140  Recent Lipid Panel    Component Value Date/Time   CHOL 98 (L) 01/26/2021 1138   TRIG 70 01/26/2021 1138   HDL 39 (L) 01/26/2021 1138   CHOLHDL 2.5 01/26/2021 1138   LDLCALC 44 01/26/2021 1138    Physical Exam:    VS:  BP (!) 148/92 (BP Location: Right Arm, Patient Position: Sitting, Cuff Size: Normal)   Pulse 64   Ht 6\' 2"  (1.88 m)   Wt 190 lb 12.8 oz (86.5 kg)   SpO2 99%   BMI 24.50 kg/m     Wt Readings from Last 3 Encounters:  08/16/21 190 lb 12.8 oz (86.5 kg)  02/06/21 193 lb 3.2 oz (87.6 kg)  11/04/20 193 lb 1.3 oz (87.6 kg)     GEN:  Well nourished, well developed in no acute distress HEENT: Normal NECK: No JVD; No carotid bruits LYMPHATICS: No lymphadenopathy CARDIAC: \EF RRR, no murmurs, rubs, gallops RESPIRATORY:  Clear to auscultation without rales, wheezing or rhonchi  ABDOMEN: Soft, non-tender, non-distended MUSCULOSKELETAL:  No edema; No deformity  SKIN: Warm and dry NEUROLOGIC:  Alert and oriented x 3 PSYCHIATRIC:  Normal affect    Signed, 11/06/20, MD  08/16/2021 8:37 AM    [facility]

## 2021-08-16 ENCOUNTER — Ambulatory Visit: Payer: PPO | Admitting: Cardiology

## 2021-08-16 ENCOUNTER — Other Ambulatory Visit: Payer: Self-pay

## 2021-08-16 ENCOUNTER — Encounter: Payer: Self-pay | Admitting: Cardiology

## 2021-08-16 ENCOUNTER — Telehealth: Payer: Self-pay

## 2021-08-16 VITALS — BP 140/76 | HR 64 | Ht 74.0 in | Wt 190.8 lb

## 2021-08-16 DIAGNOSIS — I25119 Atherosclerotic heart disease of native coronary artery with unspecified angina pectoris: Secondary | ICD-10-CM | POA: Diagnosis not present

## 2021-08-16 DIAGNOSIS — I5042 Chronic combined systolic (congestive) and diastolic (congestive) heart failure: Secondary | ICD-10-CM

## 2021-08-16 DIAGNOSIS — E782 Mixed hyperlipidemia: Secondary | ICD-10-CM | POA: Diagnosis not present

## 2021-08-16 DIAGNOSIS — Z7901 Long term (current) use of anticoagulants: Secondary | ICD-10-CM

## 2021-08-16 DIAGNOSIS — I110 Hypertensive heart disease with heart failure: Secondary | ICD-10-CM

## 2021-08-16 MED ORDER — MOMETASONE FURO-FORMOTEROL FUM 200-5 MCG/ACT IN AERO
2.0000 | INHALATION_SPRAY | Freq: Every evening | RESPIRATORY_TRACT | 12 refills | Status: DC
Start: 2021-08-16 — End: 2022-03-15

## 2021-08-16 NOTE — Patient Instructions (Signed)

## 2021-08-16 NOTE — Addendum Note (Signed)
Addended by: Eleonore Chiquito on: 08/16/2021 08:55 AM   Modules accepted: Orders

## 2021-08-16 NOTE — Telephone Encounter (Signed)
Spoke with the patient informed of the following.

## 2021-09-26 DIAGNOSIS — K7400 Hepatic fibrosis, unspecified: Secondary | ICD-10-CM

## 2021-09-26 DIAGNOSIS — E782 Mixed hyperlipidemia: Secondary | ICD-10-CM | POA: Diagnosis not present

## 2021-09-26 DIAGNOSIS — D638 Anemia in other chronic diseases classified elsewhere: Secondary | ICD-10-CM | POA: Diagnosis not present

## 2021-09-26 DIAGNOSIS — I2510 Atherosclerotic heart disease of native coronary artery without angina pectoris: Secondary | ICD-10-CM | POA: Diagnosis not present

## 2021-09-26 DIAGNOSIS — I2782 Chronic pulmonary embolism: Secondary | ICD-10-CM | POA: Diagnosis not present

## 2021-09-26 DIAGNOSIS — I255 Ischemic cardiomyopathy: Secondary | ICD-10-CM | POA: Diagnosis not present

## 2021-09-26 DIAGNOSIS — Z125 Encounter for screening for malignant neoplasm of prostate: Secondary | ICD-10-CM | POA: Diagnosis not present

## 2021-09-26 DIAGNOSIS — I129 Hypertensive chronic kidney disease with stage 1 through stage 4 chronic kidney disease, or unspecified chronic kidney disease: Secondary | ICD-10-CM | POA: Diagnosis not present

## 2021-09-26 DIAGNOSIS — R7303 Prediabetes: Secondary | ICD-10-CM | POA: Diagnosis not present

## 2021-09-26 DIAGNOSIS — Z0000 Encounter for general adult medical examination without abnormal findings: Secondary | ICD-10-CM | POA: Diagnosis not present

## 2021-09-26 DIAGNOSIS — J4520 Mild intermittent asthma, uncomplicated: Secondary | ICD-10-CM | POA: Diagnosis not present

## 2021-09-26 DIAGNOSIS — N1831 Chronic kidney disease, stage 3a: Secondary | ICD-10-CM | POA: Diagnosis not present

## 2021-09-26 HISTORY — DX: Hepatic fibrosis, unspecified: K74.00

## 2021-09-28 DIAGNOSIS — R972 Elevated prostate specific antigen [PSA]: Secondary | ICD-10-CM

## 2021-09-28 HISTORY — DX: Elevated prostate specific antigen (PSA): R97.20

## 2022-03-08 ENCOUNTER — Other Ambulatory Visit: Payer: Self-pay | Admitting: Cardiology

## 2022-03-08 DIAGNOSIS — I2693 Single subsegmental pulmonary embolism without acute cor pulmonale: Secondary | ICD-10-CM

## 2022-03-08 NOTE — Telephone Encounter (Signed)
Prescription refill request for Eliquis received. ?Indication:PE ?Last office visit:9/22 ?Scr:1.1 ?Age: 71 ?Weight:86.5 kg ? ?Prescription refilled ? ?

## 2022-03-15 ENCOUNTER — Ambulatory Visit: Payer: Medicare PPO | Admitting: Cardiology

## 2022-03-15 VITALS — BP 124/88 | HR 60 | Ht 75.0 in | Wt 193.6 lb

## 2022-03-15 DIAGNOSIS — I5042 Chronic combined systolic (congestive) and diastolic (congestive) heart failure: Secondary | ICD-10-CM | POA: Diagnosis not present

## 2022-03-15 DIAGNOSIS — I110 Hypertensive heart disease with heart failure: Secondary | ICD-10-CM

## 2022-03-15 DIAGNOSIS — I25119 Atherosclerotic heart disease of native coronary artery with unspecified angina pectoris: Secondary | ICD-10-CM

## 2022-03-15 DIAGNOSIS — E782 Mixed hyperlipidemia: Secondary | ICD-10-CM | POA: Diagnosis not present

## 2022-03-15 NOTE — Patient Instructions (Signed)

## 2022-03-15 NOTE — Progress Notes (Signed)
?Cardiology Office Note:   ? ?Date:  03/15/2022  ? ?ID:  Derek Hart, DOB [birth year], MRN [id number] ? ?PCP:  Raina Mina., MD  ?Cardiologist:  Shirlee More, MD   ? ?Referring MD: Raina Mina., MD  ? ? ?ASSESSMENT:   ? ?1. Coronary artery disease involving native coronary artery of native heart with angina pectoris ([place])   ?2. Hypertensive heart disease with chronic combined systolic and diastolic congestive heart failure ([place])   ?3. Mixed hyperlipidemia   ? ?PLAN:   ? ?In order of problems listed above: ? ?He continues to do well clinically after multiple PCI's is on long-term antithrombotic therapy with both clopidogrel and Eliquis and having no angina continue treatment including his beta-blocker lipid-lowering advised him undergo myocardial perfusion imaging at the 2-year anniversary as his course has been characterized by recurrences with marked clinical deterioration, uncertain whether or not he will do it.  He said he will send me a message if he wants to go ahead and schedule otherwise I will see him in 6 months ?Stable BP at target no evidence of heart failure currently not on loop diuretic and continue treatment including beta-blocker and ARB ?Currently only on atorvastatin he stopped a PCSK9 inhibitor and will not consider resuming labs following his PCP office ? ? ?Next appointment: 6 months ? ? ?Medication Adjustments/Labs and Tests Ordered: ?Current medicines are reviewed at length with the patient today.  Concerns regarding medicines are outlined above.  ?No orders of the defined types were placed in this encounter. ? ?No orders of the defined types were placed in this encounter. ? ? ?Chief Complaint  ?Patient presents with  ? Follow-up  ? Coronary Artery Disease  ? ? ?History of Present Illness:   ? ?Derek Hart is a 71 y.o. male with a hx of CAD PCI of the LAD and right coronary artery left circumflex 2015 LAD March 2018 after non-ST elevation MI in 10/26/2020 with recurrent acute coronary  syndrome with severe proximal LAD stenosis with PCI and stent he has had previous severe left ventricular dysfunction and cardiomyopathy most recent ejection fraction 45 to 50% 02/02/2017.  last seen 08/16/2021.  ? ?Most recent labs 09/26/2021 ?Cholesterol 144 triglycerides 66 HDL 38 LDL cholesterol 93 ?CMP potassium 4.6 sodium 142 creatinine 1.17 GFR 67 cc ? ?Is a complicated visit ?Initially told me he stopped taking Eliquis because of blood count with the more we discussed it with his PCSK9 inhibitor ?He is adamant that is the cause and being anemic and we will leave him on single agent statin ?I advised him to undergo myocardial perfusion imaging in December 2 years from his last intervention he is unsure if he wants to do this if he get back to me ?Overall he feels fairly good he fatigues easily but has no edema shortness of breath orthopnea chest pain palpitation or syncope ?He tolerates atorvastatin without muscle pain or weakness ?He has had no obvious GI bleeding and tells me he had imaging?  Abdominal ultrasound at [place] ?Past Medical History:  ?Diagnosis Date  ? Allergic rhinitis 07/19/2016  ? Asthma 07/19/2016  ? Atherosclerotic heart disease of native coronary artery without angina pectoris 07/28/2015  ?  CAD/ MI/ CABG  Overview:  Casia Corti.  MI LAD stent. 01/2017 Overview:  He had MI with staged PCI of LAD and later RCA and LCF 2015, EF 40% Overview:  Added automatically from request for surgery [id number]  ? Cardiomyopathy ([place]) 07/19/2016  ? Overview:  EF  45% or 54% 01/2017  ? Chronic bronchitis ([place])   ? COPD (chronic obstructive pulmonary disease) ([place]) 04/26/2015  ? PFT-06/27/2015-minimal obstructive airways disease with insignificant response to bronchodilator, minimal diffusion defect, normal lung volumes. FEV1/FVC 0.76, DLCO 72%  ? Coronary artery disease   ? Emphysema lung ([place])   ? "treated for asthma but later found out it was emphysema" (10/15/2017)  ? Essential hypertension 07/19/2016  ? GERD  (gastroesophageal reflux disease)   ? High risk medication use 07/19/2016  ? Hyperlipidemia   ? "left lung"  ? Inguinal hernia 07/19/2016  ? Overview:  Mild bilateral.  ? Lung nodule seen on imaging study 07/28/2015  ? chest x-ray report from 03/05/2014 at [facility] described a vague nodular density in the left upper lobe and recommended follow-up CT scan which was never done.   ? MI (myocardial infarction) ([place]) 12/2013; 01/2017  ? 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 Prediabetes 07/19/2016  ? Pulmonary embolism ([place]) 10/15/2017  ? Stage 3 chronic kidney disease ([place]) 07/19/2016  ? STEMI (ST elevation myocardial infarction) ([place]) 10/18/2017  ? Feb 2015  ? ? ?Past Surgical History:  ?Procedure Laterality Date  ? CARDIAC CATHETERIZATION  11/2016  ? CATARACT EXTRACTION W/ INTRAOCULAR LENS  IMPLANT, BILATERAL Bilateral   ? CORONARY ANGIOPLASTY WITH STENT PLACEMENT  12/2013-02/2014  ? "2 + 3 stents"  ? CORONARY ANGIOPLASTY WITH STENT PLACEMENT  01/2017  ? distal LAD  ? CORONARY STENT INTERVENTION N/A 10/25/2020  ? Procedure: CORONARY STENT INTERVENTION;  Surgeon: Martinique, Peter M, MD;  Location: [facility];  Service: Cardiovascular;  Laterality: N/A;  ? INTRAVASCULAR ULTRASOUND/IVUS N/A 10/25/2020  ? Procedure: Intravascular Ultrasound/IVUS;  Surgeon: Martinique, Peter M, MD;  Location: [place] [facility];  Service: Cardiovascular;  Laterality: N/A;  ? KNEE ARTHROSCOPY Right 1982  ? LEFT HEART CATH AND CORONARY ANGIOGRAPHY N/A 10/25/2020  ? Procedure: LEFT HEART CATH AND CORONARY ANGIOGRAPHY;  Surgeon: Martinique, Peter M, MD;  Location: [place] [facility];  Service: Cardiovascular;  Laterality: N/A;  ? ? ?Current Medications: ?Current Meds  ?Medication Sig  ? albuterol (PROVENTIL HFA;VENTOLIN HFA) 108 (90 Base) MCG/ACT inhaler Inhale 1-2 puffs into the lungs every 6 (six) hours as needed for wheezing or shortness of breath.   ? Ascorbic Acid (VITAMIN C) 1000 MG tablet Take 2,000 mg by mouth daily.  ? atorvastatin  (LIPITOR) 80 MG tablet Take 1 tablet (80 mg total) by mouth every evening.  ? BREO ELLIPTA 200-25 MCG/ACT AEPB Inhale 1 puff into the lungs daily.  ? carvedilol (COREG) 12.5 MG tablet Take 1.5 tablets (18.75 mg total) by mouth 2 (two) times daily with a meal.  ? cholecalciferol (VITAMIN D3) 25 MCG (1000 UNIT) tablet Take 1,000 Units by mouth daily.  ? clopidogrel (PLAVIX) 75 MG tablet Take 1 tablet (75 mg total) by mouth daily with breakfast.  ? Cyanocobalamin (B-12 PO) Take 1 tablet by mouth daily.   ? ELIQUIS 5 MG TABS tablet TAKE ONE TABLET BY MOUTH TWICE DAILY (Patient taking differently: Take 5 mg by mouth 2 (two) times daily.)  ? montelukast (SINGULAIR) 10 MG tablet Take 10 mg by mouth daily.  ? nitroGLYCERIN (NITROSTAT) 0.4 MG SL tablet Place 1 tablet (0.4 mg total) under the tongue every 5 (five) minutes as needed for chest pain (max 3 doses).  ? omeprazole (PRILOSEC) 40 MG capsule Take 40 mg by mouth daily.  ? QUERCETIN PO Take 1 tablet by mouth daily.  ? valsartan (DIOVAN) 80  MG tablet Take 1 tablet (80 mg total) by mouth daily.  ? zinc gluconate 50 MG tablet Take 50 mg by mouth daily.  ?  ? ?Allergies:   Pneumovax 23 [pneumococcal vac polyvalent]  ? ?Social History  ? ?Socioeconomic History  ? Marital status: Married  ?  Spouse name: Not on file  ? Number of children: Not on file  ? Years of education: Not on file  ? Highest education level: Not on file  ?Occupational History  ? Not on file  ?Tobacco Use  ? Smoking status: Former  ?  Packs/day: 1.00  ?  Years: 15.00  ?  Pack years: 15.00  ?  Types: Cigarettes  ?  Quit date: 11/19/1976  ?  Years since quitting: 45.3  ? Smokeless tobacco: Former  ?  Types: Chew  ?  Quit date: 24  ?Vaping Use  ? Vaping Use: Never used  ?Substance and Sexual Activity  ? Alcohol use: No  ? Drug use: No  ? Sexual activity: Not Currently  ?Other Topics Concern  ? Not on file  ?Social History Narrative  ? Not on file  ? ?Social Determinants of Health  ? ?Financial Resource  Strain: Not on file  ?Food Insecurity: Not on file  ?Transportation Needs: Not on file  ?Physical Activity: Not on file  ?Stress: Not on file  ?Social Connections: Not on file  ?  ? ?Family History: ?The patient's

## 2022-04-08 ENCOUNTER — Other Ambulatory Visit: Payer: Self-pay

## 2022-04-08 ENCOUNTER — Encounter (HOSPITAL_COMMUNITY): Payer: Self-pay | Admitting: Emergency Medicine

## 2022-04-08 ENCOUNTER — Emergency Department (HOSPITAL_COMMUNITY)
Admission: EM | Admit: 2022-04-08 | Discharge: 2022-04-08 | Disposition: A | Discharge status: Home / Self Care | Payer: Medicare PPO | Attending: Emergency Medicine | Admitting: Emergency Medicine

## 2022-04-08 DIAGNOSIS — R300 Dysuria: Secondary | ICD-10-CM | POA: Diagnosis present

## 2022-04-08 DIAGNOSIS — Z7901 Long term (current) use of anticoagulants: Secondary | ICD-10-CM | POA: Diagnosis not present

## 2022-04-08 DIAGNOSIS — N3001 Acute cystitis with hematuria: Secondary | ICD-10-CM

## 2022-04-08 LAB — URINALYSIS, ROUTINE W REFLEX MICROSCOPIC
Bacteria, UA: NONE SEEN
Bilirubin Urine: NEGATIVE
Glucose, UA: 50 mg/dL — AB
Ketones, ur: NEGATIVE mg/dL
Nitrite: NEGATIVE
Protein, ur: 30 mg/dL — AB
Specific Gravity, Urine: 1.026 (ref 1.005–1.030)
pH: 5 (ref 5.0–8.0)

## 2022-04-08 MED ORDER — CEPHALEXIN 500 MG PO CAPS: ORAL_CAPSULE | ORAL | 0 refills | Status: DC

## 2022-04-08 MED ORDER — TAMSULOSIN HCL 0.4 MG PO CAPS: 0.4000 mg | ORAL_CAPSULE | Freq: Every day | ORAL | 1 refills | Status: AC

## 2022-04-08 NOTE — ED Notes (Signed)
Reviewed discharge instructions with patient. Follow-up care and medications reviewed. Patient  verbalized understanding. Patient A&Ox4, VSS, and ambulatory with steady gait upon discharge.  °

## 2022-04-08 NOTE — ED Provider Notes (Signed)
[facility] EMERGENCY DEPARTMENT Provider Note   CSN: [id number] Arrival date & time: 04/08/22  1126     History  Chief Complaint  Patient presents with   Dysuria    Rodolphe Edmonston is a 71 y.o. male.  The history is provided by the patient.  Dysuria Presenting symptoms: dysuria   Presenting symptoms: no penile discharge, no penile pain, no scrotal pain and no swelling   Context: spontaneously   Relieved by:  None tried Associated symptoms: no abdominal pain, no flank pain, no groin pain, no hematuria, no nausea, no penile swelling, no scrotal swelling, no urinary incontinence, no urinary retention and no vomiting       Home Medications Prior to Admission medications   Medication Sig Start Date End Date Taking? Authorizing Provider  albuterol (PROVENTIL HFA;VENTOLIN HFA) 108 (90 Base) MCG/ACT inhaler Inhale 1-2 puffs into the lungs every 6 (six) hours as needed for wheezing or shortness of breath.     [provider]  Ascorbic Acid (VITAMIN C) 1000 MG tablet Take 2,000 mg by mouth daily.    [provider]  atorvastatin (LIPITOR) 80 MG tablet Take 1 tablet (80 mg total) by mouth every evening. 02/06/21   Baldo Daub, MD  BREO ELLIPTA 200-25 MCG/ACT AEPB Inhale 1 puff into the lungs daily. 12/13/21   [provider]  carvedilol (COREG) 12.5 MG tablet Take 1.5 tablets (18.75 mg total) by mouth 2 (two) times daily with a meal. 02/06/21   Munley, Iline Oven, MD  cholecalciferol (VITAMIN D3) 25 MCG (1000 UNIT) tablet Take 1,000 Units by mouth daily.    [provider]  clopidogrel (PLAVIX) 75 MG tablet Take 1 tablet (75 mg total) by mouth daily with breakfast. 02/06/21   Baldo Daub, MD  Cyanocobalamin (B-12 PO) Take 1 tablet by mouth daily.     [provider]  ELIQUIS 5 MG TABS tablet TAKE ONE TABLET BY MOUTH TWICE DAILY Patient taking differently: Take 5 mg by mouth 2 (two) times daily. 03/08/22   Baldo Daub, MD   montelukast (SINGULAIR) 10 MG tablet Take 10 mg by mouth daily. 04/04/15   [provider]  nitroGLYCERIN (NITROSTAT) 0.4 MG SL tablet Place 1 tablet (0.4 mg total) under the tongue every 5 (five) minutes as needed for chest pain (max 3 doses). 11/04/20   Baldo Daub, MD  omeprazole (PRILOSEC) 40 MG capsule Take 40 mg by mouth daily.    [provider]  QUERCETIN PO Take 1 tablet by mouth daily.    [provider]  valsartan (DIOVAN) 80 MG tablet Take 1 tablet (80 mg total) by mouth daily. 02/06/21   Baldo Daub, MD  zinc gluconate 50 MG tablet Take 50 mg by mouth daily.    [provider]      Allergies    Pneumovax 23 [pneumococcal vac polyvalent]    Review of Systems   Review of Systems  Gastrointestinal:  Negative for abdominal pain, nausea and vomiting.  Genitourinary:  Positive for dysuria. Negative for bladder incontinence, flank pain, hematuria, penile discharge, penile pain, penile swelling and scrotal swelling.   Physical Exam Updated Vital Signs BP 118/82 (BP Location: Right Arm)   Pulse 85   Temp 97.8 F (36.6 C) (Oral)   Resp 18   SpO2 95%  Physical Exam Vitals and nursing note reviewed.  Constitutional:      General: He is not in acute distress.    Appearance: He  is well-developed. He is not diaphoretic.  HENT:     Head: Normocephalic and atraumatic.  Eyes:     General: No scleral icterus.    Conjunctiva/sclera: Conjunctivae normal.  Cardiovascular:     Rate and Rhythm: Normal rate and regular rhythm.     Heart sounds: Normal heart sounds.  Pulmonary:     Effort: Pulmonary effort is normal. No respiratory distress.     Breath sounds: Normal breath sounds.  Abdominal:     Palpations: Abdomen is soft.     Tenderness: There is no abdominal tenderness. There is no right CVA tenderness or left CVA tenderness.  Musculoskeletal:     Cervical back: Normal range of motion and neck supple.  Skin:    General: Skin is warm  and dry.  Neurological:     Mental Status: He is alert.  Psychiatric:        Behavior: Behavior normal.    ED Results / Procedures / Treatments   Labs (all labs ordered are listed, but only abnormal results are displayed) Labs Reviewed  URINALYSIS, ROUTINE W REFLEX MICROSCOPIC - Abnormal; Notable for the following components:      Result Value   Color, Urine AMBER (*)    APPearance HAZY (*)    Glucose, UA 50 (*)    Hgb urine dipstick SMALL (*)    Protein, ur 30 (*)    Leukocytes,Ua TRACE (*)    All other components within normal limits  URINE CULTURE    EKG None  Radiology No results found.  Procedures Procedures    Medications Ordered in ED Medications - No data to display  ED Course/ Medical Decision Making/ A&P                           Medical Decision Making Patient with dysuria. UA shows infection. D/c with abx, flomax, urology follow up . No concern for pyelonephritis or urinary retension.  Amount and/or Complexity of Data Reviewed Labs: ordered.  Risk Prescription drug management.           Final Clinical Impression(s) / ED Diagnoses Final diagnoses:  None    Rx / DC Orders ED Discharge Orders     None         Arthor Captain, PA-C 04/08/22 1553    Gwyneth Sprout, MD 04/12/22 [phone]

## 2022-04-08 NOTE — Discharge Instructions (Signed)
Contact a health care provider if: Your symptoms do not get better after 1-2 days. Your symptoms go away and then return. Get help right away if: You have severe pain in your back or your lower abdomen. You have a fever or chills. You have nausea or vomiting. 

## 2022-04-08 NOTE — ED Triage Notes (Signed)
Dysuria and burning with urination since Thursday.

## 2022-09-10 NOTE — Progress Notes (Signed)
Cardiology Office Note:    Date:  09/11/2022   ID:  Derek [place], DOB 03-12-1951, MRN [id number]  PCP:  Raina Mina., MD  Cardiologist:  Shirlee More, MD    Referring MD: Raina Mina., MD    ASSESSMENT:    1. Coronary artery disease involving native coronary artery of native heart with angina pectoris ([place])   2. Hypertensive heart disease with chronic combined systolic and diastolic congestive heart failure ([place])   3. Mixed hyperlipidemia   4. Chronic anticoagulation    PLAN:    In order of problems listed above:  Gregoire continues to do well with CAD after multiple PCI's and on current medical therapy continue his combined anticoagulant antiplatelet with recurrent pulmonary embolism along with his lipid-lowering high intensity statin and beta-blocker. He has episodes of lightheadedness postero he has a validated blood pressure cuff at home and of asked him good technique to check his blood pressure record and let me know if he is having systolics frequently range of 100 210.  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  Concerns regarding medicines are outlined above.  No orders of the defined types were placed in this encounter.  No orders of the defined types were placed in this encounter.   Chief complaint follow-up CAD and heart failure   History of Present Illness:    Derek Hart is a 71 y.o. male with a hx of CAD PCI of the LAD and right coronary artery left circumflex 2015 LAD March 2018 after non-ST elevation MI in 10/26/2020 with recurrent acute coronary  syndrome with severe proximal LAD stenosis with PCI and stent he has had previous severe left ventricular dysfunction and cardiomyopathy most recent ejection fraction 45 to 50% 12/14/2020 last seen 03/15/2022.  Compliance with diet, lifestyle and medications: Yes  Continues to do well from a cardiology perspective no edema shortness of breath orthopnea chest pain palpitation or syncope.  He tolerates his high intensity statin without muscle pain or weakness he is on dual antithrombotic therapy with Eliquis and clopidogrel without bleeding.  At times when standing he feels lightheaded and I have asked him to trend record his blood pressures at home and let me know if he frequently gets systolic blood pressures in the range of 100-110.  Recent labs [facility] 05/14/2022 cholesterol 152 LDL 101 HDL 43 triglycerides 56 sodium 141 potassium 4.1 creatinine 1.16 GFR 67 cc/min normal CBC hemoglobin 14.4 platelets 165,000 Past Medical History:  Diagnosis Date   Allergic rhinitis 07/19/2016   Asthma 07/19/2016   Atherosclerotic heart disease of native coronary artery without angina pectoris 07/28/2015    CAD/ MI/ CABG  Overview:  Derek Hart.  MI LAD stent. 01/2017 Overview:  He had MI with staged PCI of LAD and later RCA and LCF 2015, EF 40% Overview:  Added automatically from request for surgery [id number]   Cardiomyopathy [facility]) 07/19/2016   Overview:  EF 45% or 54% 01/2017   Chronic bronchitis (HCC)    COPD (chronic obstructive pulmonary disease) ([place]) 04/26/2015   PFT-06/27/2015-minimal obstructive airways disease with insignificant response to bronchodilator, minimal diffusion defect, normal lung  volumes. FEV1/FVC 0.76, DLCO 72%   Coronary artery disease    Emphysema lung ([place])    "treated for asthma but later found out it was emphysema" (10/15/2017)   Essential hypertension 07/19/2016   GERD (gastroesophageal reflux disease)    High risk medication use 07/19/2016   Hyperlipidemia    "left lung"    Inguinal hernia 07/19/2016   Overview:  Mild bilateral.   Lung nodule seen on imaging study 07/28/2015   chest x-ray report from 03/05/2014 at [facility] described a vague nodular density in the left upper lobe and recommended follow-up CT scan which was never done.    MI (myocardial infarction) (Derek Hart) 12/2013; 01/2017   Old MI (myocardial infarction) 05/30/2017   Prediabetes 07/19/2016   Pulmonary embolism ([place]) 10/15/2017   Stage 3 chronic kidney disease ([place]) 07/19/2016   STEMI (ST elevation myocardial infarction) [facility]) 10/18/2017   Feb 2015    Past Surgical History:  Procedure Laterality Date   CARDIAC CATHETERIZATION  11/2016   CATARACT EXTRACTION W/ INTRAOCULAR LENS  IMPLANT, BILATERAL Bilateral    CORONARY ANGIOPLASTY WITH STENT PLACEMENT  12/2013-02/2014   "2 + 3 stents"   CORONARY ANGIOPLASTY WITH STENT PLACEMENT  01/2017   distal LAD   CORONARY STENT INTERVENTION N/A 10/25/2020   Procedure: CORONARY STENT INTERVENTION;  Surgeon: Martinique, Peter M, MD;  Location: [place] [facility];  Service: Cardiovascular;  Laterality: N/A;   INTRAVASCULAR ULTRASOUND/IVUS N/A 10/25/2020   Procedure: Intravascular Ultrasound/IVUS;  Surgeon: Martinique, Peter M, MD;  Location: [place] [facility];  Service: Cardiovascular;  Laterality: N/A;   KNEE ARTHROSCOPY Right 1982   LEFT HEART CATH AND CORONARY ANGIOGRAPHY N/A 10/25/2020   Procedure: LEFT HEART CATH AND CORONARY ANGIOGRAPHY;  Surgeon: Martinique, Peter M, MD;  Location: [place] [facility];  Service: Cardiovascular;  Laterality: N/A;    Current Medications: Current Meds  Medication Sig   Ascorbic Acid (VITAMIN C) 1000 MG tablet Take 2,000 mg by mouth daily.   atorvastatin (LIPITOR) 80 MG tablet Take 1 tablet (80 mg total) by mouth every evening.   BREO ELLIPTA 200-25 MCG/ACT AEPB Inhale 1 puff into the lungs daily.   carvedilol (COREG) 12.5 MG tablet Take 1.5 tablets (18.75 mg total) by mouth 2 (two) times daily with a meal.   cephALEXin (KEFLEX) 500  MG capsule 2 caps po bid x 7 days   cholecalciferol (VITAMIN D3) 25 MCG (1000 UNIT) tablet Take 1,000 Units by mouth daily.   clopidogrel (PLAVIX) 75 MG tablet Take 1 tablet (75 mg total) by mouth daily with breakfast.   Cyanocobalamin (B-12 PO) Take 1 tablet by mouth daily.    ELIQUIS 5 MG TABS tablet TAKE ONE TABLET BY MOUTH TWICE DAILY (Patient taking differently: Take 5 mg by mouth 2 (two) times daily.)   montelukast (SINGULAIR) 10 MG tablet Take 10 mg by mouth daily.   nitroGLYCERIN (NITROSTAT) 0.4 MG SL tablet Place 1 tablet (0.4 mg total) under the tongue every 5 (five) minutes as needed for chest pain (max 3 doses).   omeprazole (PRILOSEC) 40 MG capsule Take 40 mg by mouth daily.   QUERCETIN PO Take 1 tablet by mouth daily.   tamsulosin (FLOMAX) 0.4 MG CAPS capsule Take 1 capsule (0.4 mg total) by mouth daily.   valsartan (DIOVAN) 80 MG tablet Take 1 tablet (80 mg total) by mouth daily.   zinc gluconate 50 MG tablet Take 50 mg by mouth daily.     Allergies:   Pneumovax 23 [pneumococcal  vac polyvalent]   Social History   Socioeconomic History   Marital status: Married    Spouse name: Not on file   Number of children: Not on file   Years of education: Not on file   Highest education level: Not on file  Occupational History   Not on file  Tobacco Use   Smoking status: Former    Packs/day: 1.00    Years: 15.00    Total pack years: 15.00    Types: Cigarettes    Quit date: 11/19/1976    Years since quitting: 45.8   Smokeless tobacco: Former    Types: Chew    Quit date: 1980  Vaping Use   Vaping Use: Never used  Substance and Sexual Activity   Alcohol use: No   Drug use: No   Sexual activity: Not Currently  Other Topics Concern   Not on file  Social History Narrative   Not on file   Social Determinants of Health   Financial Resource Strain: Not on file  Food Insecurity: Not on file  Transportation Needs: Not on file  Physical Activity: Not on file  Stress: Not on  file  Social Connections: Not on file     Family History: The patient's family history includes Diabetes in his mother; Heart attack in his maternal grandmother and maternal uncle; Hyperlipidemia in his mother; Hypertension in his mother. ROS:   Please see the history of present illness.    All other systems reviewed and are negative.  EKGs/Labs/Other Studies Reviewed:    The following studies were reviewed today:    Recent Labs: See history   Physical Exam:    VS:  BP (!) 144/82 (BP Location: Right Arm)   Pulse 70   Ht 6\' 3"  (1.905 m)   Wt 197 lb 6.4 oz (89.5 kg)   SpO2 98%   BMI 24.67 kg/m     Wt Readings from Last 3 Encounters:  09/11/22 197 lb 6.4 oz (89.5 kg)  03/15/22 193 lb 9.6 oz (87.8 kg)  08/16/21 190 lb 12.8 oz (86.5 kg)     GEN:  Well nourished, well developed in no acute distress HEENT: Normal NECK: No JVD; No carotid bruits LYMPHATICS: No lymphadenopathy CARDIAC: RRR, no murmurs, rubs, gallops RESPIRATORY:  Clear to auscultation without rales, wheezing or rhonchi  ABDOMEN: Soft, non-tender, non-distended MUSCULOSKELETAL:  No edema; No deformity  SKIN: Warm and dry NEUROLOGIC:  Alert and oriented x 3 PSYCHIATRIC:  Normal affect    Signed, Shirlee More, MD  09/11/2022 8:15 AM    [place]

## 2022-09-11 ENCOUNTER — Ambulatory Visit: Payer: Medicare PPO | Attending: Cardiology | Admitting: Cardiology

## 2022-09-11 ENCOUNTER — Encounter: Payer: Self-pay | Admitting: Cardiology

## 2022-09-11 VITALS — BP 128/80 | HR 70 | Ht 75.0 in | Wt 197.4 lb

## 2022-09-11 DIAGNOSIS — E782 Mixed hyperlipidemia: Secondary | ICD-10-CM

## 2022-09-11 DIAGNOSIS — I110 Hypertensive heart disease with heart failure: Secondary | ICD-10-CM

## 2022-09-11 DIAGNOSIS — Z7901 Long term (current) use of anticoagulants: Secondary | ICD-10-CM

## 2022-09-11 DIAGNOSIS — I5042 Chronic combined systolic (congestive) and diastolic (congestive) heart failure: Secondary | ICD-10-CM

## 2022-09-11 DIAGNOSIS — I25119 Atherosclerotic heart disease of native coronary artery with unspecified angina pectoris: Secondary | ICD-10-CM

## 2022-09-11 NOTE — Patient Instructions (Signed)
Use your phone to track activity. Minimum 2500 steps daily with a goal of 8500 steps.  Check home BP, is consistently systolic BP greater than 364-680 let Dr. Bettina Gavia know.  Medication Instructions:  No medication changes. *If you need a refill on your cardiac medications before your next appointment, please call your pharmacy*   Lab Work: None ordered If you have labs (blood work) drawn today and your tests are completely normal, you will receive your results only by: [place] (if you have MyChart) OR A paper copy in the mail If you have any lab test that is abnormal or we need to change your treatment, we will call you to review the results.   Testing/Procedures: None ordered   Follow-Up: At [facility], you and your health needs are our priority.  As part of our continuing mission to provide you with exceptional heart care, we have created designated Provider Care Teams.  These Care Teams include your primary Cardiologist (physician) and Advanced Practice Providers (APPs -  Physician Assistants and Nurse Practitioners) who all work together to provide you with the care you need, when you need it.  We recommend signing up for the patient portal called "MyChart".  Sign up information is provided on this After Visit Summary.  MyChart is used to connect with patients for Virtual Visits (Telemedicine).  Patients are able to view lab/test results, encounter notes, upcoming appointments, etc.  Non-urgent messages can be sent to your provider as well.   To learn more about what you can do with MyChart, go to [email or web address].    Your next appointment:   9 month(s)  The format for your next appointment:   In Person  Provider:   Shirlee More, MD   Other Instructions NA

## 2023-02-28 ENCOUNTER — Other Ambulatory Visit: Payer: Self-pay | Admitting: Cardiology

## 2023-02-28 DIAGNOSIS — I2693 Single subsegmental pulmonary embolism without acute cor pulmonale: Secondary | ICD-10-CM

## 2023-02-28 NOTE — Telephone Encounter (Signed)
Prescription refill request for Eliquis received. Indication: PE Last office visit: 09/11/22  Caryl Pina MD Scr: 1.22 on 10/23/22  Epic Age: 72 Weight: 89.5kg  Based on above findings Eliquis 5mg  twice daily is the appropriate dose. Refill approved.

## 2023-06-24 ENCOUNTER — Emergency Department (HOSPITAL_COMMUNITY): Payer: Medicare PPO

## 2023-06-24 ENCOUNTER — Other Ambulatory Visit: Payer: Self-pay

## 2023-06-24 ENCOUNTER — Inpatient Hospital Stay (HOSPITAL_COMMUNITY)
Admission: EM | Admit: 2023-06-24 | Discharge: 2023-06-27 | DRG: 243 | Disposition: A | Discharge status: Home / Self Care | Payer: Medicare PPO | Attending: Cardiology | Admitting: Cardiology

## 2023-06-24 ENCOUNTER — Encounter (HOSPITAL_COMMUNITY): Payer: Self-pay

## 2023-06-24 DIAGNOSIS — Z833 Family history of diabetes mellitus: Secondary | ICD-10-CM

## 2023-06-24 DIAGNOSIS — J4489 Other specified chronic obstructive pulmonary disease: Secondary | ICD-10-CM | POA: Diagnosis present

## 2023-06-24 DIAGNOSIS — K219 Gastro-esophageal reflux disease without esophagitis: Secondary | ICD-10-CM | POA: Diagnosis present

## 2023-06-24 DIAGNOSIS — Z7901 Long term (current) use of anticoagulants: Secondary | ICD-10-CM | POA: Diagnosis not present

## 2023-06-24 DIAGNOSIS — Z9842 Cataract extraction status, left eye: Secondary | ICD-10-CM

## 2023-06-24 DIAGNOSIS — E785 Hyperlipidemia, unspecified: Secondary | ICD-10-CM | POA: Diagnosis present

## 2023-06-24 DIAGNOSIS — Z955 Presence of coronary angioplasty implant and graft: Secondary | ICD-10-CM | POA: Diagnosis not present

## 2023-06-24 DIAGNOSIS — I5022 Chronic systolic (congestive) heart failure: Secondary | ICD-10-CM

## 2023-06-24 DIAGNOSIS — Z79899 Other long term (current) drug therapy: Secondary | ICD-10-CM

## 2023-06-24 DIAGNOSIS — Z87891 Personal history of nicotine dependence: Secondary | ICD-10-CM | POA: Diagnosis not present

## 2023-06-24 DIAGNOSIS — I2510 Atherosclerotic heart disease of native coronary artery without angina pectoris: Secondary | ICD-10-CM | POA: Diagnosis not present

## 2023-06-24 DIAGNOSIS — N179 Acute kidney failure, unspecified: Secondary | ICD-10-CM | POA: Diagnosis not present

## 2023-06-24 DIAGNOSIS — Z7902 Long term (current) use of antithrombotics/antiplatelets: Secondary | ICD-10-CM | POA: Diagnosis not present

## 2023-06-24 DIAGNOSIS — I130 Hypertensive heart and chronic kidney disease with heart failure and stage 1 through stage 4 chronic kidney disease, or unspecified chronic kidney disease: Secondary | ICD-10-CM | POA: Diagnosis present

## 2023-06-24 DIAGNOSIS — I441 Atrioventricular block, second degree: Principal | ICD-10-CM | POA: Diagnosis present

## 2023-06-24 DIAGNOSIS — N1831 Chronic kidney disease, stage 3a: Secondary | ICD-10-CM | POA: Diagnosis present

## 2023-06-24 DIAGNOSIS — Z951 Presence of aortocoronary bypass graft: Secondary | ICD-10-CM

## 2023-06-24 DIAGNOSIS — I255 Ischemic cardiomyopathy: Secondary | ICD-10-CM | POA: Diagnosis present

## 2023-06-24 DIAGNOSIS — Z83438 Family history of other disorder of lipoprotein metabolism and other lipidemia: Secondary | ICD-10-CM

## 2023-06-24 DIAGNOSIS — R001 Bradycardia, unspecified: Secondary | ICD-10-CM

## 2023-06-24 DIAGNOSIS — E876 Hypokalemia: Secondary | ICD-10-CM | POA: Diagnosis not present

## 2023-06-24 DIAGNOSIS — R7303 Prediabetes: Secondary | ICD-10-CM | POA: Diagnosis present

## 2023-06-24 DIAGNOSIS — I252 Old myocardial infarction: Secondary | ICD-10-CM

## 2023-06-24 DIAGNOSIS — Z86711 Personal history of pulmonary embolism: Secondary | ICD-10-CM | POA: Diagnosis not present

## 2023-06-24 DIAGNOSIS — Z8249 Family history of ischemic heart disease and other diseases of the circulatory system: Secondary | ICD-10-CM | POA: Diagnosis not present

## 2023-06-24 DIAGNOSIS — N400 Enlarged prostate without lower urinary tract symptoms: Secondary | ICD-10-CM | POA: Diagnosis present

## 2023-06-24 DIAGNOSIS — Z961 Presence of intraocular lens: Secondary | ICD-10-CM | POA: Diagnosis present

## 2023-06-24 DIAGNOSIS — Z887 Allergy status to serum and vaccine status: Secondary | ICD-10-CM

## 2023-06-24 DIAGNOSIS — J439 Emphysema, unspecified: Secondary | ICD-10-CM | POA: Diagnosis present

## 2023-06-24 DIAGNOSIS — I5042 Chronic combined systolic (congestive) and diastolic (congestive) heart failure: Secondary | ICD-10-CM | POA: Diagnosis present

## 2023-06-24 DIAGNOSIS — Z9841 Cataract extraction status, right eye: Secondary | ICD-10-CM

## 2023-06-24 HISTORY — DX: Chronic systolic (congestive) heart failure: I50.22

## 2023-06-24 HISTORY — DX: Atrioventricular block, second degree: I44.1

## 2023-06-24 HISTORY — DX: Bradycardia, unspecified: R00.1

## 2023-06-24 LAB — BASIC METABOLIC PANEL
Anion gap: 7 (ref 5–15)
BUN: 20 mg/dL (ref 8–23)
CO2: 25 mmol/L (ref 22–32)
Calcium: 8.9 mg/dL (ref 8.9–10.3)
Chloride: 108 mmol/L (ref 98–111)
Creatinine, Ser: 1.34 mg/dL — ABNORMAL HIGH (ref 0.61–1.24)
GFR, Estimated: 56 mL/min — ABNORMAL LOW (ref >60)
Glucose, Bld: 108 mg/dL — ABNORMAL HIGH (ref 70–99)
Potassium: 4 mmol/L (ref 3.5–5.1)
Sodium: 140 mmol/L (ref 135–145)

## 2023-06-24 LAB — CBC
HCT: 42.6 % (ref 39.0–52.0)
Hemoglobin: 13.9 g/dL (ref 13.0–17.0)
MCH: 29.2 pg (ref 26.0–34.0)
MCHC: 32.6 g/dL (ref 30.0–36.0)
MCV: 89.5 fL (ref 80.0–100.0)
Platelets: 206 10*3/uL (ref 150–400)
RBC: 4.76 MIL/uL (ref 4.22–5.81)
RDW: 13.9 % (ref 11.5–15.5)
WBC: 8.9 10*3/uL (ref 4.0–10.5)
nRBC: 0 % (ref 0.0–0.2)

## 2023-06-24 LAB — HEPARIN LEVEL (UNFRACTIONATED): Heparin Unfractionated: >1.1 IU/mL — ABNORMAL HIGH (ref 0.30–0.70)

## 2023-06-24 LAB — BRAIN NATRIURETIC PEPTIDE: B Natriuretic Peptide: 150 pg/mL — ABNORMAL HIGH (ref 0.0–100.0)

## 2023-06-24 LAB — TROPONIN I (HIGH SENSITIVITY)
Troponin I (High Sensitivity): 6 ng/L (ref <18)
Troponin I (High Sensitivity): 6 ng/L (ref <18)

## 2023-06-24 LAB — APTT: aPTT: 34 seconds (ref 24–36)

## 2023-06-24 MED ORDER — ACETAMINOPHEN 325 MG PO TABS
650.0000 mg | ORAL_TABLET | ORAL | Status: DC | PRN
  Administered 2023-06-26 – 2023-06-27 (×2): 650 mg via ORAL
  Filled 2023-06-24 (×2): qty 2

## 2023-06-24 MED ORDER — HEPARIN (PORCINE) 25000 UT/250ML-% IV SOLN
1500.0000 [IU]/h | INTRAVENOUS | Status: AC
  Administered 2023-06-24 – 2023-06-25 (×2): 1500 [IU]/h via INTRAVENOUS
  Filled 2023-06-24 (×2): qty 250

## 2023-06-24 MED ORDER — ONDANSETRON HCL 4 MG/2ML IJ SOLN: 4.0000 mg | Freq: Four times a day (QID) | INTRAMUSCULAR | Status: DC | PRN

## 2023-06-24 MED ORDER — ATORVASTATIN CALCIUM 80 MG PO TABS
80.0000 mg | ORAL_TABLET | Freq: Every evening | ORAL | Status: DC
  Administered 2023-06-24 – 2023-06-26 (×3): 80 mg via ORAL
  Filled 2023-06-24 (×2): qty 1

## 2023-06-24 MED ORDER — FLUTICASONE FUROATE-VILANTEROL 200-25 MCG/ACT IN AEPB
1.0000 | INHALATION_SPRAY | Freq: Every day | RESPIRATORY_TRACT | Status: DC
  Administered 2023-06-25 – 2023-06-27 (×3): 1 via RESPIRATORY_TRACT
  Filled 2023-06-24: qty 28

## 2023-06-24 MED ORDER — DEXAMETHASONE SODIUM PHOSPHATE 4 MG/ML IJ SOLN
4.0000 mg | Freq: Once | INTRAMUSCULAR | Status: AC
  Administered 2023-06-24: 4 mg via INTRAVENOUS
  Filled 2023-06-24: qty 1

## 2023-06-24 MED ORDER — CLOPIDOGREL BISULFATE 75 MG PO TABS
75.0000 mg | ORAL_TABLET | Freq: Every day | ORAL | Status: DC
  Administered 2023-06-25: 75 mg via ORAL
  Filled 2023-06-24: qty 1

## 2023-06-24 NOTE — ED Notes (Signed)
ED TO INPATIENT HANDOFF REPORT  ED Nurse Name and Phone #: Vernona Rieger [phone]  S Name/Age/Gender Derek Hart 72 y.o. male Room/Bed: 040C/040C  Code Status   Code Status: Full Code  Home/SNF/Other Home Patient oriented to: self, place, time, and situation Is this baseline? Yes   Triage Complete: Triage complete  Chief Complaint Second degree heart block [I44.1]  Triage Note Pt sent by UC from bradycardia HR in the 30's. Pt's hr is in the 60's in triage. Pt c/o SOBx2-3d. Pt denies any other sx.   Allergies Allergies  Allergen Reactions   Pneumovax 23 [Pneumococcal Vac Polyvalent] Rash and Other (See Comments)    Some rash and muscle pain for 3 months     Level of Care/Admitting Diagnosis ED Disposition     ED Disposition  Admit   Condition  --   Comment  H[facility] [[id number]]  Level of Care: Telemetry Cardiac [103]  May admit patient to [facility] or [facility] if equivalent level of care is available:: No  Covid Evaluation: Asymptomatic - no recent exposure (last 10 days) testing not required  Diagnosis: Second degree heart block [218506]  Admitting Physician: Little Ishikawa [[id number]]  Attending Physician: Little Ishikawa [phone]  Certification:: I certify this patient will need inpatient services for at least 2 midnights  Estimated Length of Stay: 2          B Medical/Surgery History Past Medical History:  Diagnosis Date   Allergic rhinitis 07/19/2016   Asthma 07/19/2016   Atherosclerotic heart disease of native coronary artery without angina pectoris 07/28/2015    CAD/ MI/ CABG  Overview:  [place].  MI LAD stent. 01/2017 Overview:  He had MI with staged PCI of LAD and later RCA and LCF 2015, EF 40% Overview:  Added automatically from request for surgery [id number]   Cardiomyopathy [facility]) 07/19/2016   Overview:  EF 45% or 54% 01/2017   Chronic bronchitis (HCC)    COPD (chronic obstructive pulmonary disease) (HCC)  04/26/2015   PFT-06/27/2015-minimal obstructive airways disease with insignificant response to bronchodilator, minimal diffusion defect, normal lung volumes. FEV1/FVC 0.76, DLCO 72%   Coronary artery disease    Emphysema lung (HCC)    "treated for asthma but later found out it was emphysema" (10/15/2017)   Essential hypertension 07/19/2016   GERD (gastroesophageal reflux disease)    High risk medication use 07/19/2016   Hyperlipidemia    "left lung"   Inguinal hernia 07/19/2016   Overview:  Mild bilateral.   Lung nodule seen on imaging study 07/28/2015   chest x-ray report from 03/05/2014 at [facility] described a vague nodular density in the left upper lobe and recommended follow-up CT scan which was never done.    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   Prediabetes 07/19/2016   Pulmonary embolism (HCC) 10/15/2017   Stage 3 chronic kidney disease (HCC) 07/19/2016   STEMI (ST elevation myocardial infarction) [facility]) 10/18/2017   Feb 2015   Past Surgical History:  Procedure Laterality Date   CARDIAC CATHETERIZATION  11/2016   CATARACT EXTRACTION W/ INTRAOCULAR LENS  IMPLANT, BILATERAL Bilateral    CORONARY ANGIOPLASTY WITH STENT PLACEMENT  12/2013-02/2014   "2 + 3 stents"   CORONARY ANGIOPLASTY WITH STENT PLACEMENT  01/2017   distal LAD   CORONARY STENT INTERVENTION N/A 10/25/2020   Procedure: CORONARY STENT INTERVENTION;  Surgeon: Swaziland, Peter M, MD;  Location: [facility] INVASIVE CV LAB;  Service:  Cardiovascular;  Laterality: N/A;   CORONARY ULTRASOUND/IVUS N/A 10/25/2020   Procedure: Intravascular Ultrasound/IVUS;  Surgeon: Swaziland, Peter M, MD;  Location: [facility] INVASIVE CV LAB;  Service: Cardiovascular;  Laterality: N/A;   KNEE ARTHROSCOPY Right 1982   LEFT HEART CATH AND CORONARY ANGIOGRAPHY N/A 10/25/2020   Procedure: LEFT HEART CATH AND CORONARY ANGIOGRAPHY;  Surgeon: Swaziland, Peter M, MD;  Location: [facility] INVASIVE CV LAB;  Service:  Cardiovascular;  Laterality: N/A;     A IV Location/Drains/Wounds Patient Lines/Drains/Airways Status     Active Line/Drains/Airways     Name Placement date Placement time Site Days   Peripheral IV 06/24/23 20 G 1" Anterior;Distal;Right;Upper Arm 06/24/23  1437  Arm  less than 1            Intake/Output Last 24 hours No intake or output data in the 24 hours ending 06/24/23 1845  Labs/Imaging Results for orders placed or performed during the hospital encounter of 06/24/23 (from the past 48 hour(s))  Basic metabolic panel     Status: Abnormal   Collection Time: 06/24/23 11:33 AM  Result Value Ref Range   Sodium 140 135 - 145 mmol/L   Potassium 4.0 3.5 - 5.1 mmol/L   Chloride 108 98 - 111 mmol/L   CO2 25 22 - 32 mmol/L   Glucose, Bld 108 (H) 70 - 99 mg/dL    Comment: Glucose reference range applies only to samples taken after fasting for at least 8 hours.   BUN 20 8 - 23 mg/dL   Creatinine, Ser 1.61 (H) 0.61 - 1.24 mg/dL   Calcium 8.9 8.9 - 09.6 mg/dL   GFR, Estimated 56 (L) >60 mL/min    Comment: (NOTE) Calculated using the CKD-EPI Creatinine Equation (2021)    Anion gap 7 5 - 15    Comment: Performed at [facility], [address], [place], [place] [address]  CBC     Status: None   Collection Time: 06/24/23 11:33 AM  Result Value Ref Range   WBC 8.9 4.0 - 10.5 K/uL   RBC 4.76 4.22 - 5.81 MIL/uL   Hemoglobin 13.9 13.0 - 17.0 g/dL   HCT 98.1 19.1 - 47.8 %   MCV 89.5 80.0 - 100.0 fL   MCH 29.2 26.0 - 34.0 pg   MCHC 32.6 30.0 - 36.0 g/dL   RDW 29.5 62.1 - 30.8 %   Platelets 206 150 - 400 K/uL   nRBC 0.0 0.0 - 0.2 %    Comment: Performed at [facility] Lab, [address], [place], [place] [address]  Troponin I (High Sensitivity)     Status: None   Collection Time: 06/24/23 11:33 AM  Result Value Ref Range   Troponin I (High Sensitivity) 6 <18 ng/L    Comment: (NOTE) Elevated high sensitivity troponin I (hsTnI) values and significant  changes across  serial measurements may suggest ACS but many other  chronic and acute conditions are known to elevate hsTnI results.  Refer to the "Links" section for chest pain algorithms and additional  guidance. Performed at [facility], [address], [place], [place] [address]   Brain natriuretic peptide     Status: Abnormal   Collection Time: 06/24/23 11:47 AM  Result Value Ref Range   B Natriuretic Peptide 150.0 (H) 0.0 - 100.0 pg/mL    Comment: Performed at [facility] Lab, [address], [place], [place] [address]  Troponin I (High Sensitivity)     Status: None  Collection Time: 06/24/23  2:30 PM  Result Value Ref Range   Troponin I (High Sensitivity) 6 <18 ng/L    Comment: (NOTE) Elevated high sensitivity troponin I (hsTnI) values and significant  changes across serial measurements may suggest ACS but many other  chronic and acute conditions are known to elevate hsTnI results.  Refer to the "Links" section for chest pain algorithms and additional  guidance. Performed at [facility], [address], [place], [place] [address]   Heparin level (unfractionated)     Status: Abnormal   Collection Time: 06/24/23  4:55 PM  Result Value Ref Range   Heparin Unfractionated >1.10 (H) 0.30 - 0.70 IU/mL    Comment: (NOTE) The clinical reportable range upper limit is being lowered to >1.10 to align with the FDA approved guidance for the current laboratory assay.  If heparin results are below expected values, and patient dosage has  been confirmed, suggest follow up testing of antithrombin III levels. Performed at [facility], [address] [place], [place] [address]   APTT     Status: None   Collection Time: 06/24/23  4:55 PM  Result Value Ref Range   aPTT 34 24 - 36 seconds    Comment: Performed at [facility] Lab, [address] [place], [place] [address]   DG Chest 2 View  Result Date: 06/24/2023 CLINICAL DATA:  Shortness of breath EXAM: CHEST - 2 VIEW  COMPARISON:  10/24/2020 FINDINGS: The heart size and mediastinal contours are within normal limits. No focal airspace consolidation, pleural effusion, or pneumothorax. The visualized skeletal structures are unremarkable. IMPRESSION: No active cardiopulmonary disease. Electronically Signed   By: Duanne Guess D.O.   On: 06/24/2023 12:45    Pending Labs Unresulted Labs (From admission, onward)     Start     Ordered   06/25/23 0500  TSH  Tomorrow morning,   R        06/24/23 1813   06/25/23 0500  Basic metabolic panel  Tomorrow morning,   R        06/24/23 1813   06/25/23 0300  Heparin level (unfractionated)  Once-Timed,   TIMED        06/24/23 1649   06/25/23 0300  APTT  Once-Timed,   TIMED        06/24/23 1649            Vitals/Pain Today's Vitals   06/24/23 1730 06/24/23 1745 06/24/23 1800 06/24/23 1815  BP: 118/73 121/78 123/79 117/79  Pulse: (!) 34 (!) 36 (!) 37 (!) 37  Resp: 10 10 16 14   Temp:      TempSrc:      SpO2: 98% 96% 97% 98%  Weight:      Height:      PainSc:        Isolation Precautions No active isolations  Medications Medications  atorvastatin (LIPITOR) tablet 80 mg (has no administration in time range)  clopidogrel (PLAVIX) tablet 75 mg (has no administration in time range)  fluticasone furoate-vilanterol (BREO ELLIPTA) 200-25 MCG/ACT 1 puff (has no administration in time range)  acetaminophen (TYLENOL) tablet 650 mg (has no administration in time range)  ondansetron (ZOFRAN) injection 4 mg (has no administration in time range)  heparin ADULT infusion 100 units/mL (25000 units/242mL) (has no administration in time range)  dexamethasone (DECADRON) injection 4 mg (4 mg Intravenous Given 06/24/23 1658)    Mobility walks     Focused Assessments Cardiac Assessment Handoff:  Cardiac Rhythm: Sinus bradycardia (HR 35) Lab Results  Component Value Date   TROPONINI <0.03 09/30/2018   Lab Results  Component Value Date   DDIMER 0.30 10/25/2020    Does the Patient currently have chest pain? No    R Recommendations: See Admitting Provider Note  Report given to:   Additional Notes: Able to care for his own ADLs

## 2023-06-24 NOTE — ED Triage Notes (Signed)
Pt sent by UC from bradycardia HR in the 30's. Pt's hr is in the 60's in triage. Pt c/o SOBx2-3d. Pt denies any other sx.

## 2023-06-24 NOTE — ED Provider Notes (Signed)
Patient signed out to me at 1500 by Dr. Posey Rea pending cardiology evaluation.  In short this is a 72 year old male with a past medical history of CAD, VTE on Eliquis, CKD, COPD, hypertension presented to the emergency department from urgent care with degree heart block.  His initial arrival here he was in normal sinus rhythm.  Patient's labs including troponin are within normal range, creatinine is at baseline.  On my evaluation, the patient is resting in bed comfortably no acute distress.  Does appear to be back in second-degree heart block on the monitor.  He is asymptomatic at this time.  Blood pressure is stable.  He is evaluated by cardiology who plans to admit to their service for further management.   Elayne Snare K, DO 06/24/23 1559

## 2023-06-24 NOTE — Plan of Care (Signed)

## 2023-06-24 NOTE — ED Notes (Signed)
ED TO INPATIENT HANDOFF REPORT  ED Nurse Name and Phone #: Angelica Chessman [phone]  S Name/Age/Gender Derek Hart 72 y.o. male Room/Bed: 028C/028C  Code Status   Code Status: Full Code  Home/SNF/Other Home Patient oriented to: self, place, time, and situation Is this baseline? Yes   Triage Complete: Triage complete  Chief Complaint Second degree heart block [I44.1]  Triage Note Pt sent by UC from bradycardia HR in the 30's. Pt's hr is in the 60's in triage. Pt c/o SOBx2-3d. Pt denies any other sx.   Allergies Allergies  Allergen Reactions   Pneumovax 23 [Pneumococcal Vac Polyvalent] Rash and Other (See Comments)    Some rash and muscle pain for 3 months     Level of Care/Admitting Diagnosis ED Disposition     ED Disposition  Admit   Condition  --   Comment  [facility] [[id number]]  Level of Care: Telemetry Cardiac [103]  [facility] if equivalent level of care is available:: No  Covid Evaluation: Asymptomatic - no recent exposure (last 10 days) testing not required  Diagnosis: Second degree heart block [218506]  Admitting Physician: Little Ishikawa [[id number]]  Attending Physician: Little Ishikawa [phone]  Certification:: I certify this patient will need inpatient services for at least 2 midnights  Estimated Length of Stay: 2          B Medical/Surgery History Past Medical History:  Diagnosis Date   Allergic rhinitis 07/19/2016   Asthma 07/19/2016   Atherosclerotic heart disease of native coronary artery without angina pectoris 07/28/2015    CAD/ MI/ CABG  Overview:  [place].  MI LAD stent. 01/2017 Overview:  He had MI with staged PCI of LAD and later RCA and LCF 2015, EF 40% Overview:  Added automatically from request for surgery [id number]   Cardiomyopathy [facility]) 07/19/2016   Overview:  EF 45% or 54% 01/2017   Chronic bronchitis (HCC)    COPD (chronic obstructive pulmonary disease) (HCC)  04/26/2015   PFT-06/27/2015-minimal obstructive airways disease with insignificant response to bronchodilator, minimal diffusion defect, normal lung volumes. FEV1/FVC 0.76, DLCO 72%   Coronary artery disease    Emphysema lung (HCC)    "treated for asthma but later found out it was emphysema" (10/15/2017)   Essential hypertension 07/19/2016   GERD (gastroesophageal reflux disease)    High risk medication use 07/19/2016   Hyperlipidemia    "left lung"   Inguinal hernia 07/19/2016   Overview:  Mild bilateral.   Lung nodule seen on imaging study 07/28/2015   chest x-ray report from 03/05/2014 at [facility] described a vague nodular density in the left upper lobe and recommended follow-up CT scan which was never done.    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   Prediabetes 07/19/2016   Pulmonary embolism (HCC) 10/15/2017   Stage 3 chronic kidney disease (HCC) 07/19/2016   STEMI (ST elevation myocardial infarction) [facility]) 10/18/2017   Feb 2015   Past Surgical History:  Procedure Laterality Date   CARDIAC CATHETERIZATION  11/2016   CATARACT EXTRACTION W/ INTRAOCULAR LENS  IMPLANT, BILATERAL Bilateral    CORONARY ANGIOPLASTY WITH STENT PLACEMENT  12/2013-02/2014   "2 + 3 stents"   CORONARY ANGIOPLASTY WITH STENT PLACEMENT  01/2017   distal LAD   CORONARY STENT INTERVENTION N/A 10/25/2020   Procedure: CORONARY STENT INTERVENTION;  Surgeon: Swaziland, Peter M, MD;  Location: [facility] INVASIVE CV LAB;  Service:  Cardiovascular;  Laterality: N/A;   CORONARY ULTRASOUND/IVUS N/A 10/25/2020   Procedure: Intravascular Ultrasound/IVUS;  Surgeon: Swaziland, Peter M, MD;  Location: [facility] INVASIVE CV LAB;  Service: Cardiovascular;  Laterality: N/A;   KNEE ARTHROSCOPY Right 1982   LEFT HEART CATH AND CORONARY ANGIOGRAPHY N/A 10/25/2020   Procedure: LEFT HEART CATH AND CORONARY ANGIOGRAPHY;  Surgeon: Swaziland, Peter M, MD;  Location: [facility] INVASIVE CV LAB;  Service:  Cardiovascular;  Laterality: N/A;     A IV Location/Drains/Wounds Patient Lines/Drains/Airways Status     Active Line/Drains/Airways     Name Placement date Placement time Site Days   Peripheral IV 06/24/23 20 G 1" Anterior;Distal;Right;Upper Arm 06/24/23  1437  Arm  less than 1            Intake/Output Last 24 hours No intake or output data in the 24 hours ending 06/24/23 1714  Labs/Imaging Results for orders placed or performed during the hospital encounter of 06/24/23 (from the past 48 hour(s))  Basic metabolic panel     Status: Abnormal   Collection Time: 06/24/23 11:33 AM  Result Value Ref Range   Sodium 140 135 - 145 mmol/L   Potassium 4.0 3.5 - 5.1 mmol/L   Chloride 108 98 - 111 mmol/L   CO2 25 22 - 32 mmol/L   Glucose, Bld 108 (H) 70 - 99 mg/dL    Comment: Glucose reference range applies only to samples taken after fasting for at least 8 hours.   BUN 20 8 - 23 mg/dL   Creatinine, Ser 1.30 (H) 0.61 - 1.24 mg/dL   Calcium 8.9 8.9 - 86.5 mg/dL   GFR, Estimated 56 (L) >60 mL/min    Comment: (NOTE) Calculated using the CKD-EPI Creatinine Equation (2021)    Anion gap 7 5 - 15    Comment: Performed at [facility] Lab, [address], [place], [place] [address]  CBC     Status: None   Collection Time: 06/24/23 11:33 AM  Result Value Ref Range   WBC 8.9 4.0 - 10.5 K/uL   RBC 4.76 4.22 - 5.81 MIL/uL   Hemoglobin 13.9 13.0 - 17.0 g/dL   HCT 62.9 52.8 - 41.3 %   MCV 89.5 80.0 - 100.0 fL   MCH 29.2 26.0 - 34.0 pg   MCHC 32.6 30.0 - 36.0 g/dL   RDW 24.4 01.0 - 27.2 %   Platelets 206 150 - 400 K/uL   nRBC 0.0 0.0 - 0.2 %    Comment: Performed at [facility], [address], [place], [place] [address]  Troponin I (High Sensitivity)     Status: None   Collection Time: 06/24/23 11:33 AM  Result Value Ref Range   Troponin I (High Sensitivity) 6 <18 ng/L    Comment: (NOTE) Elevated high sensitivity troponin I (hsTnI) values and significant  changes across  serial measurements may suggest ACS but many other  chronic and acute conditions are known to elevate hsTnI results.  Refer to the "Links" section for chest pain algorithms and additional  guidance. Performed at [facility], [address], [place], [place] [address]   Brain natriuretic peptide     Status: Abnormal   Collection Time: 06/24/23 11:47 AM  Result Value Ref Range   B Natriuretic Peptide 150.0 (H) 0.0 - 100.0 pg/mL    Comment: Performed at [facility] Lab, [address] [place], [place] [address]  Troponin I (High Sensitivity)     Status: None  Collection Time: 06/24/23  2:30 PM  Result Value Ref Range   Troponin I (High Sensitivity) 6 <18 ng/L    Comment: (NOTE) Elevated high sensitivity troponin I (hsTnI) values and significant  changes across serial measurements may suggest ACS but many other  chronic and acute conditions are known to elevate hsTnI results.  Refer to the "Links" section for chest pain algorithms and additional  guidance. Performed at [facility] Lab, [address], [place], [place] [address]    DG Chest 2 View  Result Date: 06/24/2023 CLINICAL DATA:  Shortness of breath EXAM: CHEST - 2 VIEW COMPARISON:  10/24/2020 FINDINGS: The heart size and mediastinal contours are within normal limits. No focal airspace consolidation, pleural effusion, or pneumothorax. The visualized skeletal structures are unremarkable. IMPRESSION: No active cardiopulmonary disease. Electronically Signed   By: Duanne Guess D.O.   On: 06/24/2023 12:45    Pending Labs Unresulted Labs (From admission, onward)     Start     Ordered   06/25/23 0300  Heparin level (unfractionated)  Once-Timed,   TIMED        06/24/23 1649   06/25/23 0300  APTT  Once-Timed,   TIMED        06/24/23 1649   06/24/23 1631  Heparin level (unfractionated)  ONCE - URGENT,   URGENT        06/24/23 1631   06/24/23 1631  APTT  ONCE - STAT,   STAT        06/24/23 1631   Signed and Held   TSH  Tomorrow morning,   R        Signed and Held   Signed and Held  Basic metabolic panel  Tomorrow morning,   R        Signed and Held            Vitals/Pain Today's Vitals   06/24/23 1330 06/24/23 1415 06/24/23 1430 06/24/23 1540  BP: 127/86 133/88    Pulse: (!) 54 (!) 52 (!) 34   Resp: (!) 9 11 10    Temp:    97.8 F (36.6 C)  TempSrc:    Oral  SpO2: 95% 96% 97%   Weight:      Height:      PainSc:        Isolation Precautions No active isolations  Medications Medications  heparin ADULT infusion 100 units/mL (25000 units/229mL) (has no administration in time range)  dexamethasone (DECADRON) injection 4 mg (4 mg Intravenous Given 06/24/23 1658)    Mobility walks     Focused Assessments Cardiac Assessment Handoff:  Cardiac Rhythm: Sinus bradycardia (HR 35) Lab Results  Component Value Date   TROPONINI <0.03 09/30/2018   Lab Results  Component Value Date   DDIMER 0.30 10/25/2020   Does the Patient currently have chest pain? No    R Recommendations: See Admitting Provider Note  Report given to:   Additional Notes: Pt has been asymptomatic, HR in the 30's, walky/talky, is frustrated about having to use a urinal but per Bjorn Pippin, MD, he would prefer him to stay in the bed.  Family at bedside

## 2023-06-24 NOTE — Progress Notes (Signed)
ANTICOAGULATION CONSULT NOTE - Initial Consult  Pharmacy Consult for heparin Indication: pulmonary embolus  Allergies  Allergen Reactions   Pneumovax 23 [Pneumococcal Vac Polyvalent] Rash and Other (See Comments)    Some rash and muscle pain for 3 months     Patient Measurements: Height: 6\' 3"  (190.5 cm) Weight: 89.5 kg (197 lb 5 oz) IBW/kg (Calculated) : 84.5 Heparin Dosing Weight: 89.5 kg  Vital Signs: Temp: 97.8 F (36.6 C) (08/05 1540) Temp Source: Oral (08/05 1540) BP: 133/88 (08/05 1415) Pulse Rate: 34 (08/05 1430)  Labs: Recent Labs    06/24/23 1133 06/24/23 1430  HGB 13.9  --   HCT 42.6  --   PLT 206  --   CREATININE 1.34*  --   TROPONINIHS 6 6    Estimated Creatinine Clearance: 58.7 mL/min (A) (by C-G formula based on SCr of 1.34 mg/dL (H)).   Medical History: Past Medical History:  Diagnosis Date   Allergic rhinitis 07/19/2016   Asthma 07/19/2016   Atherosclerotic heart disease of native coronary artery without angina pectoris 07/28/2015    CAD/ MI/ CABG  Overview:  Munley.  MI LAD stent. 01/2017 Overview:  He had MI with staged PCI of LAD and later RCA and LCF 2015, EF 40% Overview:  Added automatically from request for surgery [id number]   Cardiomyopathy [facility]) 07/19/2016   Overview:  EF 45% or 54% 01/2017   Chronic bronchitis (HCC)    COPD (chronic obstructive pulmonary disease) (HCC) 04/26/2015   PFT-06/27/2015-minimal obstructive airways disease with insignificant response to bronchodilator, minimal diffusion defect, normal lung volumes. FEV1/FVC 0.76, DLCO 72%   Coronary artery disease    Emphysema lung (HCC)    "treated for asthma but later found out it was emphysema" (10/15/2017)   Essential hypertension 07/19/2016   GERD (gastroesophageal reflux disease)    High risk medication use 07/19/2016   Hyperlipidemia    "left lung"   Inguinal hernia 07/19/2016   Overview:  Mild bilateral.   Lung nodule seen on imaging study 07/28/2015   chest x-ray report from  03/05/2014 at [facility] described a vague nodular density in the left upper lobe and recommended follow-up CT scan which was never done.    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 Patient taking Eliquis prior to [facility] for history of recurrent PE, last dose this morning. Eliquis being held in anticipation of PPM. Baseline heparin level and aPTT ordered. CBC wnl.   Goal of Therapy:  Heparin level 0.3-0.7 units/ml aPTT 66-102 seconds Monitor platelets by anticoagulation protocol: Yes   Plan:  Start heparin infusion at 1500 units/hr  Check heparin level and aPTT in 8 hours until levels correlate Continue to monitor H&H and platelets   I  06/24/2023,4:31 PM

## 2023-06-24 NOTE — ED Provider Notes (Signed)
[place] [facility]E PROGRESSIVE CARE Provider Note  CSN: [id number] Arrival date & time: 06/24/23 1119  Chief Complaint(s) Bradycardia  HPI Paladin Veillette is a 72 y.o. male with PMH CAD status post DES placement, CHF, HTN, HLD, COPD, CKD 3 who presents emergency room for evaluation of bradycardia and shortness of breath.  Patient states that over the last 72 hours he has had some mild progressive exertional dyspnea.  He states he primarily went to urgent care today to get "a Kenalog shot" because this traditionally makes him feel better and he gets this about once a month.  While at urgent care, an EKG was performed showing a Mobitz type II heart block and he was transferred to the emergency department for further evaluation.  Here in the emergency department, while at rest he has no symptoms including chest pain, shortness of breath, abdominal pain, nausea, vomiting or other systemic symptoms.   Past Medical History Past Medical History:  Diagnosis Date   Allergic rhinitis 07/19/2016   Asthma 07/19/2016   Atherosclerotic heart disease of native coronary artery without angina pectoris 07/28/2015    CAD/ MI/ CABG  Overview:  Munley.  MI LAD stent. 01/2017 Overview:  He had MI with staged PCI of LAD and later RCA and LCF 2015, EF 40% Overview:  Added automatically from request for surgery [id number]   Cardiomyopathy [facility]) 07/19/2016   Overview:  EF 45% or 54% 01/2017   Chronic bronchitis (HCC)    COPD (chronic obstructive pulmonary disease) (HCC) 04/26/2015   PFT-06/27/2015-minimal obstructive airways disease with insignificant response to bronchodilator, minimal diffusion defect, normal lung volumes. FEV1/FVC 0.76, DLCO 72%   Coronary artery disease    Emphysema lung (HCC)    "treated for asthma but later found out it was emphysema" (10/15/2017)   Essential hypertension 07/19/2016   GERD (gastroesophageal reflux disease)    High risk medication use 07/19/2016   Hyperlipidemia    "left lung"   Inguinal  hernia 07/19/2016   Overview:  Mild bilateral.   Lung nodule seen on imaging study 07/28/2015   chest x-ray report from 03/05/2014 at [facility] described a vague nodular density in the left upper lobe and recommended follow-up CT scan which was never done.    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   Prediabetes 07/19/2016   Pulmonary embolism (HCC) 10/15/2017   Stage 3 chronic kidney disease (HCC) 07/19/2016   STEMI (ST elevation myocardial infarction) [facility]) 10/18/2017   Feb 2015   Patient Active Problem List   Diagnosis Date Noted   Second degree heart block 06/24/2023   Bradycardia 06/24/2023   Chronic systolic heart failure (HCC) 06/24/2023   Elevated PSA 09/28/2021   Fibrosis of liver 09/26/2021   Elevated alkaline phosphatase level 04/06/2021   Anemia, chronic disease 03/09/2021   MI (myocardial infarction) (HCC)    Hyperlipidemia    GERD (gastroesophageal reflux disease)    Emphysema lung (HCC)    Coronary artery disease    Chronic bronchitis (HCC)    Chest pain 10/25/2020   NSTEMI (non-ST elevated myocardial infarction) (HCC) 10/25/2020   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   Hematuria 08/15/2018   Chronic anticoagulation 10/21/2017   STEMI (ST elevation myocardial infarction) (HCC) 10/18/2017   Pulmonary embolism (HCC) 10/15/2017   Ischemic cardiomyopathy 05/30/2017   Old MI (myocardial infarction) 05/30/2017   Chest pain in adult 02/01/2017   Allergic rhinitis 07/19/2016   Asthma 07/19/2016   Essential hypertension 07/19/2016  High risk medication use 07/19/2016   Inguinal hernia 07/19/2016   Mixed hyperlipidemia 07/19/2016   Prediabetes 07/19/2016   Stage 3 chronic kidney disease (HCC) 07/19/2016   Cardiomyopathy (HCC) 07/19/2016   Lung nodule seen on imaging study 07/28/2015   Coronary artery disease involving native coronary artery of native heart with angina pectoris (HCC) 07/28/2015    Atherosclerotic heart disease of native coronary artery without angina pectoris 07/28/2015   Dyspnea on exertion 04/26/2015   COPD (chronic obstructive pulmonary disease) (HCC) 04/26/2015   Home Medication(s) Prior to Admission medications   Medication Sig Start Date End Date Taking? Authorizing Provider  apixaban (ELIQUIS) 5 MG TABS tablet TAKE ONE TABLET BY MOUTH TWICE DAILY 02/28/23  Yes Baldo Daub, MD  Ascorbic Acid (VITAMIN C) 1000 MG tablet Take 2,000 mg by mouth daily.   Yes [provider]  atorvastatin (LIPITOR) 80 MG tablet Take 1 tablet (80 mg total) by mouth every evening. 02/06/21  Yes Munley, Iline Oven, MD  BREO ELLIPTA 200-25 MCG/ACT AEPB Inhale 1 puff into the lungs daily. 12/13/21  Yes [provider]  carvedilol (COREG) 12.5 MG tablet Take 1.5 tablets (18.75 mg total) by mouth 2 (two) times daily with a meal. 02/06/21  Yes Munley, Iline Oven, MD  cholecalciferol (VITAMIN D3) 25 MCG (1000 UNIT) tablet Take 1,000 Units by mouth daily.   Yes [provider]  clopidogrel (PLAVIX) 75 MG tablet Take 1 tablet (75 mg total) by mouth daily with breakfast. 02/06/21  Yes Baldo Daub, MD  Cyanocobalamin (B-12 PO) Take 1 tablet by mouth daily.    Yes [provider]  montelukast (SINGULAIR) 10 MG tablet Take 10 mg by mouth daily. 04/04/15  Yes [provider]  omeprazole (PRILOSEC) 40 MG capsule Take 40 mg by mouth daily.   Yes [provider]  tamsulosin (FLOMAX) 0.4 MG CAPS capsule Take 1 capsule (0.4 mg total) by mouth daily. 04/08/22  Yes Harris, Abigail, PA-C  valsartan (DIOVAN) 80 MG tablet Take 1 tablet (80 mg total) by mouth daily. 02/06/21  Yes Baldo Daub, MD                                                                                                                                    Past Surgical History Past Surgical History:  Procedure Laterality Date   CARDIAC CATHETERIZATION  11/2016   CATARACT EXTRACTION W/  INTRAOCULAR LENS  IMPLANT, BILATERAL Bilateral    CORONARY ANGIOPLASTY WITH STENT PLACEMENT  12/2013-02/2014   "2 + 3 stents"   CORONARY ANGIOPLASTY WITH STENT PLACEMENT  01/2017   distal LAD   CORONARY STENT INTERVENTION N/A 10/25/2020   Procedure: CORONARY STENT INTERVENTION;  Surgeon: Swaziland, Peter M, MD;  Location[facility] INVASIVE CV LAB;  Service: Cardiovascular;  Laterality: N/A;   CORONARY ULTRASOUND/IVUS N/A 10/25/2020   Procedure: Intravascular Ultrasound/IVUS;  Surgeon: Swaziland, Peter M, MD;  Location: [facility] INVASIVE  CV LAB;  Service: Cardiovascular;  Laterality: N/A;   KNEE ARTHROSCOPY Right 1982   LEFT HEART CATH AND CORONARY ANGIOGRAPHY N/A 10/25/2020   Procedure: LEFT HEART CATH AND CORONARY ANGIOGRAPHY;  Surgeon: Swaziland, Peter M, MD;  Location: [facility] INVASIVE CV LAB;  Service: Cardiovascular;  Laterality: N/A;   Family History Family History  Problem Relation Age of Onset   Hyperlipidemia Mother    Hypertension Mother    Diabetes Mother    Heart attack Maternal Uncle    Heart attack Maternal Grandmother     Social History Social History   Tobacco Use   Smoking status: Former    Current packs/day: 0.00    Average packs/day: 1 pack/day for 15.0 years (15.0 ttl pk-yrs)    Types: Cigarettes    Start date: 11/19/1961    Quit date: 11/19/1976    Years since quitting: 46.6   Smokeless tobacco: Former    Types: Chew    Quit date: 1980  Vaping Use   Vaping status: Never Used  Substance Use Topics   Alcohol use: No   Drug use: No   Allergies Pneumovax 23 [pneumococcal vac polyvalent]  Review of Systems Review of Systems  Respiratory:  Positive for shortness of breath.     Physical Exam Vital Signs  I have reviewed the triage vital signs BP 122/76 (BP Location: Left Arm)   Pulse (!) 42   Temp 97.6 F (36.4 C) (Oral)   Resp 16   Ht 6\' 3"  (1.905 m)   Wt 89.5 kg   SpO2 96%   BMI 24.66 kg/m   Physical Exam Constitutional:      General: He is not in acute distress.     Appearance: Normal appearance.  HENT:     Head: Normocephalic and atraumatic.     Nose: No congestion or rhinorrhea.  Eyes:     General:        Right eye: No discharge.        Left eye: No discharge.     Extraocular Movements: Extraocular movements intact.     Pupils: Pupils are equal, round, and reactive to light.  Cardiovascular:     Rate and Rhythm: Regular rhythm. Bradycardia present.     Heart sounds: No murmur heard. Pulmonary:     Effort: No respiratory distress.     Breath sounds: No wheezing or rales.  Abdominal:     General: There is no distension.     Tenderness: There is no abdominal tenderness.  Musculoskeletal:        General: Normal range of motion.     Cervical back: Normal range of motion.  Skin:    General: Skin is warm and dry.  Neurological:     General: No focal deficit present.     Mental Status: He is alert.     ED Results and Treatments Labs (all labs ordered are listed, but only abnormal results are displayed) Labs Reviewed  BASIC METABOLIC PANEL - Abnormal; Notable for the following components:      Result Value   Glucose, Bld 108 (*)    Creatinine, Ser 1.34 (*)    GFR, Estimated 56 (*)    All other components within normal limits  HEPARIN LEVEL (UNFRACTIONATED) - Abnormal; Notable for the following components:   Heparin Unfractionated >1.10 (*)    All other components within normal limits  CBC  APTT  HEPARIN LEVEL (UNFRACTIONATED)  APTT  TSH  BASIC METABOLIC PANEL  TROPONIN I (HIGH SENSITIVITY)  TROPONIN I (HIGH SENSITIVITY)                                                                                                                          Radiology DG Chest 2 View  Result Date: 06/24/2023 CLINICAL DATA:  Shortness of breath EXAM: CHEST - 2 VIEW COMPARISON:  10/24/2020 FINDINGS: The heart size and mediastinal contours are within normal limits. No focal airspace consolidation, pleural effusion, or pneumothorax. The visualized  skeletal structures are unremarkable. IMPRESSION: No active cardiopulmonary disease. Electronically Signed   By: Duanne Guess D.O.   On: 06/24/2023 12:45    Pertinent labs & imaging results that were available during my care of the patient were reviewed by me and considered in my medical decision making (see MDM for details).  Medications Ordered in ED Medications  atorvastatin (LIPITOR) tablet 80 mg (80 mg Oral Given 06/24/23 1917)  clopidogrel (PLAVIX) tablet 75 mg (has no administration in time range)  fluticasone furoate-vilanterol (BREO ELLIPTA) 200-25 MCG/ACT 1 puff (has no administration in time range)  acetaminophen (TYLENOL) tablet 650 mg (has no administration in time range)  ondansetron (ZOFRAN) injection 4 mg (has no administration in time range)  heparin ADULT infusion 100 units/mL (25000 units/267mL) (1,500 Units/hr Intravenous New Bag/Given 06/24/23 1919)  dexamethasone (DECADRON) injection 4 mg (4 mg Intravenous Given 06/24/23 1658)                                                                                                                                     Procedures Procedures  (including critical care time)  Medical Decision Making / ED Course   This patient presents to the ED for concern of shortness of breath, this involves an extensive number of treatment options, and is a complaint that carries with it a high risk of complications and morbidity.  The differential diagnosis includes arrhythmia, heart block, electrolyte abnormality, pneumonia, CHF  MDM: Patient seen emergency room for evaluation of shortness of breath and abnormal EKG.  Physical exam is largely unremarkable outside of a regular bradycardia.  Laboratory evaluation with a creatinine of 1.34 but is otherwise unremarkable.  High-sensitivity troponin unremarkable.  Chest x-ray unremarkable.  Initial EKG while here in the emergency department shows normal sinus rhythm with no evidence of heart block but I  am able to review the EKG from urgent care and it is indeed concerning for a  Mobitz type II heart block.  I consulted the cardiology team and their recommendations are currently pending at time of signout.  Please see provider signout continuation of workup.   Additional history obtained: -Additional history obtained from wife -External records from outside source obtained and reviewed including: Chart review including previous notes, labs, imaging, consultation notes   Lab Tests: -I ordered, reviewed, and interpreted labs.   The pertinent results include:   Labs Reviewed  BASIC METABOLIC PANEL - Abnormal; Notable for the following components:      Result Value   Glucose, Bld 108 (*)    Creatinine, Ser 1.34 (*)    GFR, Estimated 56 (*)    All other components within normal limits  HEPARIN LEVEL (UNFRACTIONATED) - Abnormal; Notable for the following components:   Heparin Unfractionated >1.10 (*)    All other components within normal limits  CBC  APTT  HEPARIN LEVEL (UNFRACTIONATED)  APTT  TSH  BASIC METABOLIC PANEL  TROPONIN I (HIGH SENSITIVITY)  TROPONIN I (HIGH SENSITIVITY)      EKG   EKG Interpretation Date/Time:  Monday June 24 2023 11:29:03 EDT Ventricular Rate:  62 PR Interval:  206 QRS Duration:  92 QT Interval:  416 QTC Calculation: 422 R Axis:   10  Text Interpretation: Normal sinus rhythm Low voltage QRS When compared with ECG of 26-Oct-2020 04:52, PREVIOUS ECG IS PRESENT Confirmed by ,  ([id number]) on 06/24/2023 11:44:05 AM         Imaging Studies ordered: I ordered imaging studies including chest x-ray I independently visualized and interpreted imaging. I agree with the radiologist interpretation   Medicines ordered and prescription drug management: Meds ordered this encounter  Medications   dexamethasone (DECADRON) injection 4 mg   atorvastatin (LIPITOR) tablet 80 mg   clopidogrel (PLAVIX) tablet 75 mg   fluticasone furoate-vilanterol  (BREO ELLIPTA) 200-25 MCG/ACT 1 puff   acetaminophen (TYLENOL) tablet 650 mg   ondansetron (ZOFRAN) injection 4 mg   heparin ADULT infusion 100 units/mL (25000 units/237mL)    -I have reviewed the patients home medicines and have made adjustments as needed  Critical interventions none  Consultations Obtained: I requested consultation with the cardiology team on-call,  and discussed lab and imaging findings as well as pertinent plan - they recommend: Recommendations are pending   Cardiac Monitoring: The patient was maintained on a cardiac monitor.  I personally viewed and interpreted the cardiac monitored which showed an underlying rhythm of: NSR, sinus bradycardia  Social Determinants of Health:  Factors impacting patients care include: none   Reevaluation: After the interventions noted above, I reevaluated the patient and found that they have :stayed the same  Co morbidities that complicate the patient evaluation  Past Medical History:  Diagnosis Date   Allergic rhinitis 07/19/2016   Asthma 07/19/2016   Atherosclerotic heart disease of native coronary artery without angina pectoris 07/28/2015    CAD/ MI/ CABG  Overview:  Munley.  MI LAD stent. 01/2017 Overview:  He had MI with staged PCI of LAD and later RCA and LCF 2015, EF 40% Overview:  Added automatically from request for surgery [id number]   Cardiomyopathy [facility]) 07/19/2016   Overview:  EF 45% or 54% 01/2017   Chronic bronchitis (HCC)    COPD (chronic obstructive pulmonary disease) (HCC) 04/26/2015   PFT-06/27/2015-minimal obstructive airways disease with insignificant response to bronchodilator, minimal diffusion defect, normal lung volumes. FEV1/FVC 0.76, DLCO 72%   Coronary artery disease    Emphysema lung (HCC)    "  treated for asthma but later found out it was emphysema" (10/15/2017)   Essential hypertension 07/19/2016   GERD (gastroesophageal reflux disease)    High risk medication use 07/19/2016   Hyperlipidemia    "left  lung"   Inguinal hernia 07/19/2016   Overview:  Mild bilateral.   Lung nodule seen on imaging study 07/28/2015   chest x-ray report from 03/05/2014 at [facility] described a vague nodular density in the left upper lobe and recommended follow-up CT scan which was never done.    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   Prediabetes 07/19/2016   Pulmonary embolism (HCC) 10/15/2017   Stage 3 chronic kidney disease (HCC) 07/19/2016   STEMI (ST elevation myocardial infarction) [facility]) 10/18/2017   Feb 2015      Dispostion: I considered admission for this patient, and disposition pending evaluation by cardiology team.  Please see provider signout for continuation of workup.     Final Clinical Impression(s) / ED Diagnoses Final diagnoses:  Second degree heart block     @PCDICTATION @    Glendora Score, MD 06/24/23 1946

## 2023-06-24 NOTE — H&P (Addendum)
Cardiology H&P   Patient ID: Derek Hart MRN: [id number]; DOB: 1951/03/14  Admit date: 06/24/2023 Date of Consult: 06/24/2023  PCP:  Gordan Payment., MD   [facility] Providers Cardiologist:  Norman Herrlich, MD     Patient Profile:   Derek Hart is a 72 y.o. male with a hx of CAD s/p PCI of LAD, Lcx '18, NSTEMI s/p PCI of pLAD, HFrEF, HTN, HLD, emphysema/COPD, CKD III who is being seen 06/24/2023 for the evaluation of 2nd degree AVB, type II, recurrent PE at the request of Dr. Posey Rea.  History of Present Illness:   Derek Hart is a 72 yo male with PMH noted above. Has been followed by Dr. Dulce Hart as an outpatient. Underwent PCI of the LAD and RCA in 2015. Seen again in 10/2020 with NSTEMI and PCI of severe pLAD. Echo 11/2020 with LVEF of 45-50%, G1DD, normal RV, mild to moderate dilation of the ascending aorta, 41 mm. Last seen in office with Dr. Dulce Hart on 08/2022 and reported doing well at his baseline.   Presented to UC with nasal congestion and they noted his heart rate in the 30s. Advised he should be transferred via EMS but he declined. EKG at [facility] showed 2nd degree AVB type II, rate 37bpm.   Labs in the ED showed Na+ 140, K+ 4.0, Cr 1.34, BNP 150, hsTn 6, WBC 8.9, Hgb 13.9. EKG in the ED showed sinus rhythm, 62 bpm with nonspecific changes. In review of telemetry, he was initially in SR but converted back into 2nd degree AVB type II around 2pm with rates in the 30s. He is completely asymptomatic with this rhythm. Denies any chest pain or shortness of breath.   He has been on coreg 18.75mg  BID for a number of years, denies any issues. Reports his last dose was 7am this morning.  Past Medical History:  Diagnosis Date   Allergic rhinitis 07/19/2016   Asthma 07/19/2016   Atherosclerotic heart disease of native coronary artery without angina pectoris 07/28/2015    CAD/ MI/ CABG  Overview:  [facility].  MI LAD stent. 01/2017 Overview:  He had MI with staged PCI of LAD and later RCA and  LCF 2015, EF 40% Overview:  Added automatically from request for surgery [id number]   Cardiomyopathy [facility]) 07/19/2016   Overview:  EF 45% or 54% 01/2017   Chronic bronchitis (HCC)    COPD (chronic obstructive pulmonary disease) (HCC) 04/26/2015   PFT-06/27/2015-minimal obstructive airways disease with insignificant response to bronchodilator, minimal diffusion defect, normal lung volumes. FEV1/FVC 0.76, DLCO 72%   Coronary artery disease    Emphysema lung (HCC)    "treated for asthma but later found out it was emphysema" (10/15/2017)   Essential hypertension 07/19/2016   GERD (gastroesophageal reflux disease)    High risk medication use 07/19/2016   Hyperlipidemia    "left lung"   Inguinal hernia 07/19/2016   Overview:  Mild bilateral.   Lung nodule seen on imaging study 07/28/2015   chest x-ray report from 03/05/2014 at [facility] described a vague nodular density in the left upper lobe and recommended follow-up CT scan which was never done.    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   Prediabetes 07/19/2016   Pulmonary embolism (HCC) 10/15/2017   Stage 3 chronic kidney disease (HCC) 07/19/2016   STEMI (ST elevation myocardial infarction) [facility]) 10/18/2017   Feb 2015    Past Surgical History:  Procedure Laterality Date   CARDIAC  CATHETERIZATION  11/2016   CATARACT EXTRACTION W/ INTRAOCULAR LENS  IMPLANT, BILATERAL Bilateral    CORONARY ANGIOPLASTY WITH STENT PLACEMENT  12/2013-02/2014   "2 + 3 stents"   CORONARY ANGIOPLASTY WITH STENT PLACEMENT  01/2017   distal LAD   CORONARY STENT INTERVENTION N/A 10/25/2020   Procedure: CORONARY STENT INTERVENTION;  Surgeon: Swaziland, Peter M, MD;  Location: [facility] INVASIVE CV LAB;  Service: Cardiovascular;  Laterality: N/A;   CORONARY ULTRASOUND/IVUS N/A 10/25/2020   Procedure: Intravascular Ultrasound/IVUS;  Surgeon: Swaziland, Peter M, MD;  Location: [facility] INVASIVE CV LAB;  Service: Cardiovascular;  Laterality:  N/A;   KNEE ARTHROSCOPY Right 1982   LEFT HEART CATH AND CORONARY ANGIOGRAPHY N/A 10/25/2020   Procedure: LEFT HEART CATH AND CORONARY ANGIOGRAPHY;  Surgeon: Swaziland, Peter M, MD;  Location: [facility] INVASIVE CV LAB;  Service: Cardiovascular;  Laterality: N/A;    Inpatient Medications: Scheduled Meds:  dexamethasone (DECADRON) injection  4 mg Intravenous Once   Continuous Infusions:  PRN Meds:   Allergies:    Allergies  Allergen Reactions   Pneumovax 23 [Pneumococcal Vac Polyvalent] Rash and Other (See Comments)    Some rash and muscle pain for 3 months     Social History:   Social History   Socioeconomic History   Marital status: Married    Spouse name: Not on file   Number of children: Not on file   Years of education: Not on file   Highest education level: Not on file  Occupational History   Not on file  Tobacco Use   Smoking status: Former    Current packs/day: 0.00    Average packs/day: 1 pack/day for 15.0 years (15.0 ttl pk-yrs)    Types: Cigarettes    Start date: 11/19/1961    Quit date: 11/19/1976    Years since quitting: 46.6   Smokeless tobacco: Former    Types: Chew    Quit date: 1980  Vaping Use   Vaping status: Never Used  Substance and Sexual Activity   Alcohol use: No   Drug use: No   Sexual activity: Not Currently  Other Topics Concern   Not on file  Social History Narrative   Not on file   Social Determinants of Health   Financial Resource Strain: Not on file  Food Insecurity: Not on file  Transportation Needs: Not on file  Physical Activity: Not on file  Stress: Not on file  Social Connections: Not on file  Intimate Partner Violence: Not on file    Family History:    Family History  Problem Relation Age of Onset   Hyperlipidemia Mother    Hypertension Mother    Diabetes Mother    Heart attack Maternal Uncle    Heart attack Maternal Grandmother      ROS:  Please see the history of present illness.   All other ROS reviewed and  negative.     Physical Exam/Data:   Vitals:   06/24/23 1330 06/24/23 1415 06/24/23 1430 06/24/23 1540  BP: 127/86 133/88    Pulse: (!) 54 (!) 52 (!) 34   Resp: (!) 9 11 10    Temp:    97.8 F (36.6 C)  TempSrc:    Oral  SpO2: 95% 96% 97%   Weight:      Height:       No intake or output data in the 24 hours ending 06/24/23 1616    06/24/2023   11:29 AM 09/11/2022    7:52 AM 03/15/2022  9:41 AM  Last 3 Weights  Weight (lbs) 197 lb 5 oz 197 lb 6.4 oz 193 lb 9.6 oz  Weight (kg) 89.5 kg 89.54 kg 87.816 kg     Body mass index is 24.66 kg/m.  General:  Well nourished, well developed, in no acute distress HEENT: normal Neck: no JVD Vascular: No carotid bruits; Distal pulses 2+ bilaterally Cardiac:  normal S1, S2; RRR; no murmur  Lungs:  clear to auscultation bilaterally, no wheezing, rhonchi or rales  Abd: soft, nontender, no hepatomegaly  Ext: no edema Musculoskeletal:  No deformities, BUE and BLE strength normal and equal Skin: warm and dry  Neuro:  CNs 2-12 intact, no focal abnormalities noted Psych:  Normal affect   EKG:  The EKG was personally reviewed and demonstrates:  Sinus Rhythm, 62 bpm with non-specific changes  Telemetry:  Telemetry was personally reviewed and demonstrates:  sinus rhythm-->2nd degree AVB type II  Relevant CV Studies:  Cath: 10/2020  Previously placed Mid RCA stent (unknown type) is widely patent. Previously placed Prox RCA-1 stent (unknown type) is widely patent. Prox RCA-2 lesion is 30% stenosed. Dist RCA lesion is 35% stenosed. RPDA lesion is 60% stenosed. 1st RPL lesion is 80% stenosed. Previously placed Mid Cx to Dist Cx stent (unknown type) is widely patent. 1st Mrg lesion is 95% stenosed. 2nd Mrg lesion is 50% stenosed. 3rd Mrg lesion is 40% stenosed. Prox LAD to Mid LAD lesion is 95% stenosed. A drug-eluting stent was successfully placed using a STENT RESOLUTE ONYX 3.5X15. Post intervention, there is a 0% residual stenosis. Dist  LAD lesion is 99% stenosed. Mid LAD to Dist LAD lesion is 50% stenosed. LV end diastolic pressure is normal.   1. 3 vessel CAD    - the culprit lesion is the proximal LAD with 95% in stent restenosis.     - 99% occlusion of the distal LAD stent with good right to left collaterals from the RCA    - diffuse 95% stenosis in tiny OM1. Patent stent in the mid LCx    - Patent stents in the proximal and mid RCA    - 80% stenosis in a very small PL branch. 60% ostial PDA 2. Normal LVEDP 3. Successful PCI of the proximal LAD with IVUS guidance and DES x 1 4. Unable to cross the distal LAD occlusion with a wire. This portion of the vessel is well collateralized.   Plan: DAPT with ASA for one month and Plavix for 12 months. May resume DOAC tomorrow. Plan to switch Eliquis to Xarelto due to formulary coverage. Anticipate DC in am.   Diagnostic Dominance: Right  Intervention    Laboratory Data:  High Sensitivity Troponin:   Recent Labs  Lab 06/24/23 1133 06/24/23 1430  TROPONINIHS 6 6     Chemistry Recent Labs  Lab 06/24/23 1133  NA 140  K 4.0  CL 108  CO2 25  GLUCOSE 108*  BUN 20  CREATININE 1.34*  CALCIUM 8.9  GFRNONAA 56*  ANIONGAP 7    No results for input(s): "PROT", "ALBUMIN", "AST", "ALT", "ALKPHOS", "BILITOT" in the last 168 hours. Lipids No results for input(s): "CHOL", "TRIG", "HDL", "LABVLDL", "LDLCALC", "CHOLHDL" in the last 168 hours.  Hematology Recent Labs  Lab 06/24/23 1133  WBC 8.9  RBC 4.76  HGB 13.9  HCT 42.6  MCV 89.5  MCH 29.2  MCHC 32.6  RDW 13.9  PLT 206   Thyroid No results for input(s): "TSH", "FREET4" in the last 168 hours.  BNP Recent Labs  Lab 06/24/23 1147  BNP 150.0*    DDimer No results for input(s): "DDIMER" in the last 168 hours.   Radiology/Studies:  DG Chest 2 View  Result Date: 06/24/2023 CLINICAL DATA:  Shortness of breath EXAM: CHEST - 2 VIEW COMPARISON:  10/24/2020 FINDINGS: The heart size and mediastinal contours  are within normal limits. No focal airspace consolidation, pleural effusion, or pneumothorax. The visualized skeletal structures are unremarkable. IMPRESSION: No active cardiopulmonary disease. Electronically Signed   By: Duanne Guess D.O.   On: 06/24/2023 12:45     Assessment and Plan:   Matsuo Rezendes is a 72 y.o. male with a hx of CAD s/p PCI of LAD, Lcx '18, NSTEMI s/p PCI of pLAD, HFrEF, HTN, HLD, emphysema/COPD, CKD III who is being seen 06/24/2023 for the evaluation of 2nd degree AVB, type II, recurrent PE at the request of Dr. Posey Rea.  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 He presented to the ED in sinus rhythm but converted back in AVB around 2pm with rates back into the 30s. He is well compensated with stable BPs.  -- last dose of coreg 18.75mg  BID was 7am this morning, will need wash out period with follow up of conduction -- check echo  Hx of recurrent PE -- on chronic eliquis 5mg  BID, will hold evening dose in the event PPM in needed -- add IV heparin for coverage  HFmrEF -- echo 2022 with LVEF of 45-50%, does not appear volume overloaded on exam -- repeat echo as above  CAD s/p LAD, Lcx  -- denies any anginal symptoms -- hsTn negative, continue plavix, statin  HTN -- blood pressures stable while in the ED -- will allow for higher BPs to compensate for now -- holding home ARB  #Full Code   For questions or updates, please contact [facility] Please consult [email or web address] for contact info under    Signed, Laverda Page, NP  06/24/2023 4:16 PM   Patient seen and examined.  Agree with above documentation.  Derek Hart is a 72 year old male with a history of CAD, COPD, CKD stage III, ischemic cardiomyopathy, hypertension who we are consulted for evaluation of bradycardia at the request of Dr. Posey Rea.  Patient follows with Dr. Dulce Hart.  He has had multiple coronary  interventions, underwent PCI of LAD/RCA in 2015.  Had NSTEMI in 2021 with PCI to proximal LAD.  Last echo 11/2020 showed EF 45 to 50%.  He presented to urgent care today with nasal congestion, noted to have heart rate in 30s.  EKG with 2:1 block.  He was sent to the ED.  On arrival in the ED, initial EKG showed sinus rhythm with rate 60s.  Subsequently on telemetry he went into 2-1 block with rate in 30s.  He appears asymptomatic, denies any chest pain, shortness of breath, or lightheadedness.  He has been on carvedilol 18.75 mg twice daily with last dose this morning.  Also takes Eliquis for recurrent PE, last dose this morning.  Vital signs notable for BP 145/101, pulse 59, SpO2 99% on room air.  Labs notable for creatinine 1.34, BNP 150, troponin 6 > 6, WBC 8.9, platelets 206, hemoglobin 13.9.  Chest x-ray unremarkable.  On exam, patient is alert and oriented, bradycardic, regular rhythm, no murmurs, lungs CTAB, no LE edema or JVD.  For his bradycardia with 2-1 AV block with  rate in 30s, appears asymptomatic and hemodynamically stable.  Will hold his carvedilol and monitor rhythm.  If continues to have AV block with carvedilol washout, will need evaluation for pacemaker.  Will update echocardiogram.  Will hold his Eliquis in case pacemaker is needed, given his history of recurrent PEs will start on heparin drip.  Derek Ishikawa, MD

## 2023-06-25 ENCOUNTER — Inpatient Hospital Stay (HOSPITAL_COMMUNITY): Payer: Medicare PPO

## 2023-06-25 DIAGNOSIS — N179 Acute kidney failure, unspecified: Secondary | ICD-10-CM

## 2023-06-25 DIAGNOSIS — I5022 Chronic systolic (congestive) heart failure: Secondary | ICD-10-CM | POA: Diagnosis not present

## 2023-06-25 DIAGNOSIS — I441 Atrioventricular block, second degree: Secondary | ICD-10-CM | POA: Diagnosis not present

## 2023-06-25 DIAGNOSIS — I2510 Atherosclerotic heart disease of native coronary artery without angina pectoris: Secondary | ICD-10-CM | POA: Diagnosis not present

## 2023-06-25 DIAGNOSIS — N1831 Chronic kidney disease, stage 3a: Secondary | ICD-10-CM

## 2023-06-25 HISTORY — DX: Acute kidney failure, unspecified: N17.9

## 2023-06-25 LAB — CBC
HCT: 40.4 % (ref 39.0–52.0)
Hemoglobin: 13.7 g/dL (ref 13.0–17.0)
MCH: 30 pg (ref 26.0–34.0)
MCHC: 33.9 g/dL (ref 30.0–36.0)
MCV: 88.6 fL (ref 80.0–100.0)
Platelets: 211 10*3/uL (ref 150–400)
RBC: 4.56 MIL/uL (ref 4.22–5.81)
RDW: 13.7 % (ref 11.5–15.5)
WBC: 9 10*3/uL (ref 4.0–10.5)
nRBC: 0 % (ref 0.0–0.2)

## 2023-06-25 LAB — SURGICAL PCR SCREEN
MRSA, PCR: NEGATIVE
Staphylococcus aureus: NEGATIVE

## 2023-06-25 LAB — APTT: aPTT: 85 seconds — ABNORMAL HIGH (ref 24–36)

## 2023-06-25 MED ORDER — ASPIRIN 81 MG PO TBEC
81.0000 mg | DELAYED_RELEASE_TABLET | Freq: Every day | ORAL | Status: DC
  Administered 2023-06-26 – 2023-06-27 (×2): 81 mg via ORAL
  Filled 2023-06-25 (×2): qty 1

## 2023-06-25 MED ORDER — SODIUM CHLORIDE 0.9 % IV SOLN
80.0000 mg | INTRAVENOUS | Status: AC
  Administered 2023-06-26: 80 mg
  Filled 2023-06-25: qty 2

## 2023-06-25 MED ORDER — LACTATED RINGERS IV SOLN: INTRAVENOUS | Status: DC

## 2023-06-25 MED ORDER — SODIUM CHLORIDE 0.9 % IV SOLN
250.0000 mL | INTRAVENOUS | Status: DC
  Administered 2023-06-26: 250 mL via INTRAVENOUS

## 2023-06-25 MED ORDER — SODIUM CHLORIDE 0.9% FLUSH
3.0000 mL | Freq: Two times a day (BID) | INTRAVENOUS | Status: DC
  Administered 2023-06-25 – 2023-06-27 (×3): 3 mL via INTRAVENOUS

## 2023-06-25 MED ORDER — SODIUM CHLORIDE 0.9% FLUSH: 3.0000 mL | INTRAVENOUS | Status: DC | PRN

## 2023-06-25 MED ORDER — SODIUM CHLORIDE 0.9 % IV SOLN: INTRAVENOUS | Status: DC

## 2023-06-25 MED ORDER — PANTOPRAZOLE SODIUM 40 MG PO TBEC
40.0000 mg | DELAYED_RELEASE_TABLET | Freq: Every day | ORAL | Status: DC
  Administered 2023-06-25 – 2023-06-27 (×3): 40 mg via ORAL
  Filled 2023-06-25 (×3): qty 1

## 2023-06-25 MED ORDER — TAMSULOSIN HCL 0.4 MG PO CAPS
0.4000 mg | ORAL_CAPSULE | Freq: Every day | ORAL | Status: DC
  Administered 2023-06-25 – 2023-06-27 (×3): 0.4 mg via ORAL
  Filled 2023-06-25 (×3): qty 1

## 2023-06-25 MED ORDER — CEFAZOLIN SODIUM-DEXTROSE 2-4 GM/100ML-% IV SOLN
2.0000 g | INTRAVENOUS | Status: AC
  Administered 2023-06-26: 2 g via INTRAVENOUS
  Filled 2023-06-25: qty 100

## 2023-06-25 NOTE — TOC CM/SW Note (Signed)
Transition of Care [facility]) - Inpatient Brief Assessment   Patient Details  Name: Derek Hart MRN: [id number] Date of Birth: [birth year]  Transition of Care [facility]) CM/SW Contact:    Gala Lewandowsky, RN Phone Number: 06/25/2023, 2:26 PM   Clinical Narrative: Patient presented for second degree HB. Plan for pacemaker implant today. Case Manager will continue to follow for transition of care needs as the patient progresses.      Transition of Care Asessment: Insurance and Status: Insurance coverage has been reviewed Patient has primary care physician: Yes  Prior/Current Home Services: No current home services Social Determinants of Health Reivew: SDOH reviewed no interventions necessary Readmission risk has been reviewed: Yes Transition of care needs: transition of care needs identified, TOC will continue to follow

## 2023-06-25 NOTE — Progress Notes (Signed)
ANTICOAGULATION CONSULT NOTE - Follow Up  Pharmacy Consult for heparin Indication: pulmonary embolus  Allergies  Allergen Reactions   Pneumovax 23 [Pneumococcal Vac Polyvalent] Rash and Other (See Comments)    Some rash and muscle pain for 3 months     Patient Measurements: Height: 6\' 3"  (190.5 cm) Weight: 89.5 kg (197 lb 5 oz) IBW/kg (Calculated) : 84.5 Heparin Dosing Weight: 89.5 kg  Vital Signs: Temp: 98 F (36.7 C) (08/06 0805) Temp Source: Oral (08/06 0805) BP: 98/67 (08/06 0805) Pulse Rate: 39 (08/06 0805)  Labs: Recent Labs    06/24/23 1133 06/24/23 1430 06/24/23 1655 06/25/23 0247 06/25/23 1102  HGB 13.9  --   --  13.7  --   HCT 42.6  --   --  40.4  --   PLT 206  --   --  211  --   APTT  --   --  34 91* 85*  HEPARINUNFRC  --   --  >1.10* >1.10*  --   CREATININE 1.34*  --   --  1.62*  --   TROPONINIHS 6 6  --   --   --     Estimated Creatinine Clearance: 48.5 mL/min (A) (by C-G formula based on SCr of 1.62 mg/dL (H)).   Medical History: Past Medical History:  Diagnosis Date   Allergic rhinitis 07/19/2016   Asthma 07/19/2016   Atherosclerotic heart disease of native coronary artery without angina pectoris 07/28/2015    CAD/ MI/ CABG  Overview:  [facility].  MI LAD stent. 01/2017 Overview:  He had MI with staged PCI of LAD and later RCA and LCF 2015, EF 40% Overview:  Added automatically from request for surgery [id number]   Cardiomyopathy [facility]) 07/19/2016   Overview:  EF 45% or 54% 01/2017   Chronic bronchitis (HCC)    COPD (chronic obstructive pulmonary disease) (HCC) 04/26/2015   PFT-06/27/2015-minimal obstructive airways disease with insignificant response to bronchodilator, minimal diffusion defect, normal lung volumes. FEV1/FVC 0.76, DLCO 72%   Coronary artery disease    Emphysema lung (HCC)    "treated for asthma but later found out it was emphysema" (10/15/2017)   Essential hypertension 07/19/2016   GERD (gastroesophageal reflux disease)    High risk  medication use 07/19/2016   Hyperlipidemia    "left lung"   Inguinal hernia 07/19/2016   Overview:  Mild bilateral.   Lung nodule seen on imaging study 07/28/2015   chest x-ray report from 03/05/2014 at [facility] described a vague nodular density in the left upper lobe and recommended follow-up CT scan which was never done.    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   Prediabetes 07/19/2016   Pulmonary embolism (HCC) 10/15/2017   Stage 3 chronic kidney disease (HCC) 07/19/2016   STEMI (ST elevation myocardial infarction) [facility]) 10/18/2017   Feb 2015    Medications:  Medications Prior to Admission  Medication Sig Dispense Refill Last Dose   apixaban (ELIQUIS) 5 MG TABS tablet TAKE ONE TABLET BY MOUTH TWICE DAILY 180 tablet 1 06/24/2023 at 0700   Ascorbic Acid (VITAMIN C) 1000 MG tablet Take 2,000 mg by mouth daily.   unk   atorvastatin (LIPITOR) 80 MG tablet Take 1 tablet (80 mg total) by mouth every evening. 90 tablet 3 06/23/2023   BREO ELLIPTA 200-25 MCG/ACT AEPB Inhale 1 puff into the lungs daily.   06/23/2023   carvedilol (COREG) 12.5 MG tablet Take 1.5 tablets (18.75 mg total) by mouth  2 (two) times daily with a meal. 270 tablet 3    cholecalciferol (VITAMIN D3) 25 MCG (1000 UNIT) tablet Take 1,000 Units by mouth daily.   unk   clopidogrel (PLAVIX) 75 MG tablet Take 1 tablet (75 mg total) by mouth daily with breakfast. 90 tablet 3 06/23/2023   Cyanocobalamin (B-12 PO) Take 1 tablet by mouth daily.    06/23/2023   montelukast (SINGULAIR) 10 MG tablet Take 10 mg by mouth daily.  2 unk   omeprazole (PRILOSEC) 40 MG capsule Take 40 mg by mouth daily.   06/24/2023   tamsulosin (FLOMAX) 0.4 MG CAPS capsule Take 1 capsule (0.4 mg total) by mouth daily. 30 capsule 1 06/23/2023   valsartan (DIOVAN) 80 MG tablet Take 1 tablet (80 mg total) by mouth daily. 90 tablet 3 unk    Assessment: 72 y/o male presenting with recurrent pulmonary embolism. Patient  taking Eliquis prior to [facility] for history of recurrent PE, last dose this morning. Eliquis being held in anticipation of PPM.   -aPTT= 85 on 1500 units/hr  Goal of Therapy:  Heparin level 0.3-0.7 units/ml aPTT 66-102 seconds Monitor platelets by anticoagulation protocol: Yes   Plan:  Continue heparin infusion at 1500 units/hr  Heparin on hold at 5am on 8/7 for possible PPM  Harland German, PharmD Clinical Pharmacist **Pharmacist phone directory can now be found on [email or web address] ([email or web address]).  Listed under [facility].

## 2023-06-25 NOTE — Plan of Care (Signed)
  Problem: Education: Goal: Understanding of cardiac disease, CV risk reduction, and recovery process will improve Outcome: Progressing Goal: Individualized Educational Video(s) Outcome: Progressing   Problem: Activity: Goal: Ability to tolerate increased activity will improve Outcome: Progressing   Problem: Cardiac: Goal: Ability to achieve and maintain adequate cardiovascular perfusion will improve Outcome: Progressing   Problem: Health Behavior/Discharge Planning: Goal: Ability to safely manage health-related needs after discharge will improve Outcome: Progressing   Problem: Education: Goal: Knowledge of General Education information will improve Description: Including pain rating scale, medication(s)/side effects and non-pharmacologic comfort measures Outcome: Progressing   Problem: Health Behavior/Discharge Planning: Goal: Ability to manage health-related needs will improve Outcome: Progressing   Problem: Clinical Measurements: Goal: Ability to maintain clinical measurements within normal limits will improve Outcome: Progressing Goal: Will remain free from infection Outcome: Progressing Goal: Diagnostic test results will improve Outcome: Progressing Goal: Respiratory complications will improve Outcome: Progressing Goal: Cardiovascular complication will be avoided Outcome: Progressing   Problem: Activity: Goal: Risk for activity intolerance will decrease Outcome: Progressing   Problem: Nutrition: Goal: Adequate nutrition will be maintained Outcome: Progressing   Problem: Coping: Goal: Level of anxiety will decrease Outcome: Progressing   Problem: Elimination: Goal: Will not experience complications related to bowel motility Outcome: Progressing Goal: Will not experience complications related to urinary retention Outcome: Progressing   Problem: Pain Managment: Goal: General experience of comfort will improve Outcome: Progressing   Problem:  Safety: Goal: Ability to remain free from injury will improve Outcome: Progressing   Problem: Skin Integrity: Goal: Risk for impaired skin integrity will decrease Outcome: Progressing   Problem: Education: Goal: Knowledge of cardiac device and self-care will improve Outcome: Progressing Goal: Ability to safely manage health related needs after discharge will improve Outcome: Progressing Goal: Individualized Educational Video(s) Outcome: Progressing   Problem: Cardiac: Goal: Ability to achieve and maintain adequate cardiopulmonary perfusion will improve Outcome: Progressing

## 2023-06-25 NOTE — Consult Note (Addendum)
Cardiology Consultation   Patient ID: Derek Hart MRN: [id number]; DOB: 1951/02/01  Admit date: 06/24/2023 Date of Consult: 06/25/2023  PCP:  Gordan Payment., MD   [facility] Providers Cardiologist:  Norman Herrlich, MD   {   Patient Profile:   Derek Hart is a 71 y.o. 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 Bjorn Pippin.  History of Present Illness:   Derek Hart went to an [facility]  with complaints of nasal congestion and incidentally found to be bradycardic and transferred to [facility] for further evaluation. He was noted to have intermittent periods of 2:1 AV block and admitted for BB washout and evaluation. His home coreg stopped (18.75mg  BID), last dose 06/24/23 morning @ 0700.  LABS K+ 4.0, 3.9 BUN/Creat 20/1.34 > 26/1.62 BNP 150 HS Trop 6, 6 WBC 9 H/H 13/40 Plts 211 TSH 0.799  The patient/his wife report about this time every year he gets a URI/sinus infection and that is what they sought attention for, surprised to hear there was any heart problems. He denies any CP, palpitations or cardiac awareness No SOB, DOE Very active He has though in the last 20mo been having fairly infrequent, maybe 3x/month or so, sudden and momentary dizzy spells that he sits for. He has hx of syncope years ago after a surgery, none otherwise    Past Medical History:  Diagnosis Date   Allergic rhinitis 07/19/2016   Asthma 07/19/2016   Atherosclerotic heart disease of native coronary artery without angina pectoris 07/28/2015    CAD/ MI/ CABG  Overview:  Munley.  MI LAD stent. 01/2017 Overview:  He had MI with staged PCI of LAD and later RCA and LCF 2015, EF 40% Overview:  Added automatically from request for surgery [id number]   Cardiomyopathy [facility]) 07/19/2016   Overview:  EF 45% or 54% 01/2017   Chronic bronchitis (HCC)    COPD  (chronic obstructive pulmonary disease) (HCC) 04/26/2015   PFT-06/27/2015-minimal obstructive airways disease with insignificant response to bronchodilator, minimal diffusion defect, normal lung volumes. FEV1/FVC 0.76, DLCO 72%   Coronary artery disease    Emphysema lung (HCC)    "treated for asthma but later found out it was emphysema" (10/15/2017)   Essential hypertension 07/19/2016   GERD (gastroesophageal reflux disease)    High risk medication use 07/19/2016   Hyperlipidemia    "left lung"   Inguinal hernia 07/19/2016   Overview:  Mild bilateral.   Lung nodule seen on imaging study 07/28/2015   chest x-ray report from 03/05/2014 at [facility] described a vague nodular density in the left upper lobe and recommended follow-up CT scan which was never done.    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   Prediabetes 07/19/2016   Pulmonary embolism (HCC) 10/15/2017   Stage 3 chronic kidney disease (HCC) 07/19/2016   STEMI (ST elevation myocardial infarction) [facility]) 10/18/2017   Feb 2015    Past Surgical History:  Procedure Laterality Date   CARDIAC CATHETERIZATION  11/2016   CATARACT EXTRACTION W/ INTRAOCULAR LENS  IMPLANT, BILATERAL Bilateral    CORONARY ANGIOPLASTY WITH STENT PLACEMENT  12/2013-02/2014   "2 + 3 stents"   CORONARY ANGIOPLASTY WITH STENT PLACEMENT  01/2017   distal LAD   CORONARY STENT INTERVENTION N/A 10/25/2020   Procedure: CORONARY STENT  INTERVENTION;  Surgeon: Swaziland, Peter M, MD;  Location: [facility] INVASIVE CV LAB;  Service: Cardiovascular;  Laterality: N/A;   CORONARY ULTRASOUND/IVUS N/A 10/25/2020   Procedure: Intravascular Ultrasound/IVUS;  Surgeon: Swaziland, Peter M, MD;  Location: [facility] INVASIVE CV LAB;  Service: Cardiovascular;  Laterality: N/A;   KNEE ARTHROSCOPY Right 1982   LEFT HEART CATH AND CORONARY ANGIOGRAPHY N/A 10/25/2020   Procedure: LEFT HEART CATH AND CORONARY ANGIOGRAPHY;  Surgeon: Swaziland, Peter M, MD;   Location: [facility] INVASIVE CV LAB;  Service: Cardiovascular;  Laterality: N/A;     Home Medications:  Prior to Admission medications   Medication Sig Start Date End Date Taking? Authorizing Provider  apixaban (ELIQUIS) 5 MG TABS tablet TAKE ONE TABLET BY MOUTH TWICE DAILY 02/28/23  Yes Baldo Daub, MD  Ascorbic Acid (VITAMIN C) 1000 MG tablet Take 2,000 mg by mouth daily.   Yes [provider]  atorvastatin (LIPITOR) 80 MG tablet Take 1 tablet (80 mg total) by mouth every evening. 02/06/21  Yes Munley, Iline Oven, MD  BREO ELLIPTA 200-25 MCG/ACT AEPB Inhale 1 puff into the lungs daily. 12/13/21  Yes [provider]  carvedilol (COREG) 12.5 MG tablet Take 1.5 tablets (18.75 mg total) by mouth 2 (two) times daily with a meal. 02/06/21  Yes Munley, Iline Oven, MD  cholecalciferol (VITAMIN D3) 25 MCG (1000 UNIT) tablet Take 1,000 Units by mouth daily.   Yes [provider]  clopidogrel (PLAVIX) 75 MG tablet Take 1 tablet (75 mg total) by mouth daily with breakfast. 02/06/21  Yes Baldo Daub, MD  Cyanocobalamin (B-12 PO) Take 1 tablet by mouth daily.    Yes [provider]  montelukast (SINGULAIR) 10 MG tablet Take 10 mg by mouth daily. 04/04/15  Yes [provider]  omeprazole (PRILOSEC) 40 MG capsule Take 40 mg by mouth daily.   Yes [provider]  tamsulosin (FLOMAX) 0.4 MG CAPS capsule Take 1 capsule (0.4 mg total) by mouth daily. 04/08/22  Yes Harris, Abigail, PA-C  valsartan (DIOVAN) 80 MG tablet Take 1 tablet (80 mg total) by mouth daily. 02/06/21  Yes Baldo Daub, MD    Inpatient Medications: Scheduled Meds:  atorvastatin  80 mg Oral QPM   clopidogrel  75 mg Oral Q breakfast   fluticasone furoate-vilanterol  1 puff Inhalation Daily   pantoprazole  40 mg Oral Daily   tamsulosin  0.4 mg Oral Daily   Continuous Infusions:  heparin 1,500 Units/hr (06/24/23 1919)   lactated ringers 80 mL/hr at 06/25/23 0830   PRN Meds: acetaminophen,  ondansetron (ZOFRAN) IV  Allergies:    Allergies  Allergen Reactions   Pneumovax 23 [Pneumococcal Vac Polyvalent] Rash and Other (See Comments)    Some rash and muscle pain for 3 months     Social History:   Social History   Socioeconomic History   Marital status: Married    Spouse name: Not on file   Number of children: Not on file   Years of education: Not on file   Highest education level: Not on file  Occupational History   Not on file  Tobacco Use   Smoking status: Former    Current packs/day: 0.00    Average packs/day: 1 pack/day for 15.0 years (15.0 ttl pk-yrs)    Types: Cigarettes    Start date: 11/19/1961    Quit date: 11/19/1976    Years since quitting: 46.6   Smokeless tobacco: Former    Types: Chew    Quit date:  1980  Vaping Use   Vaping status: Never Used  Substance and Sexual Activity   Alcohol use: No   Drug use: No   Sexual activity: Not Currently  Other Topics Concern   Not on file  Social History Narrative   Not on file   Social Determinants of Health   Financial Resource Strain: Not on file  Food Insecurity: No Food Insecurity (06/24/2023)   Hunger Vital Sign    Worried About Running Out of Food in the Last Year: Never true    Ran Out of Food in the Last Year: Never true  Transportation Needs: No Transportation Needs (06/24/2023)   PRAPARE - Administrator, Civil Service (Medical): No    Lack of Transportation (Non-Medical): No  Physical Activity: Not on file  Stress: Not on file  Social Connections: Not on file  Intimate Partner Violence: Not At Risk (06/24/2023)   Humiliation, Afraid, Rape, and Kick questionnaire    Fear of Current or Ex-Partner: No    Emotionally Abused: No    Physically Abused: No    Sexually Abused: No    Family History:   Family History  Problem Relation Age of Onset   Hyperlipidemia Mother    Hypertension Mother    Diabetes Mother    Heart attack Maternal Uncle    Heart attack Maternal Grandmother       ROS:  Please see the history of present illness.  All other ROS reviewed and negative.     Physical Exam/Data:   Vitals:   06/24/23 2133 06/25/23 0000 06/25/23 0500 06/25/23 0805  BP:   101/63 98/67  Pulse: (!) 43   (!) 39  Resp:   18 18  Temp:   97.7 F (36.5 C) 98 F (36.7 C)  TempSrc:   Oral Oral  SpO2: 94% 94%  96%  Weight:      Height:        Intake/Output Summary (Last 24 hours) at 06/25/2023 1021 Last data filed at 06/25/2023 0630 Gross per 24 hour  Intake 167.13 ml  Output 1100 ml  Net -932.87 ml      06/24/2023   11:29 AM 09/11/2022    7:52 AM 03/15/2022    9:41 AM  Last 3 Weights  Weight (lbs) 197 lb 5 oz 197 lb 6.4 oz 193 lb 9.6 oz  Weight (kg) 89.5 kg 89.54 kg 87.816 kg     Body mass index is 24.66 kg/m.  General:  Well nourished, well developed, in no acute distress HEENT: normal Neck: no JVD Vascular: No carotid bruits; Distal pulses 2+ bilaterally Cardiac: RRR; no murmurs, gallops or rubs Lungs:  CTA b/l, no wheezing, rhonchi or rales  Abd: soft, nontender Ext: no edema Musculoskeletal:  No deformities Skin: warm and dry  Neuro: no focal abnormalities noted Psych:  Normal affect   EKG:  The EKG was personally reviewed and demonstrates:    SR 62bpm, , Q V1-3  OLD SR 61bpm  Telemetry:  Telemetry was personally reviewed and demonstrates:    SR intermittent 2:1 AVBlock c/w Mobitz II block Rates 30's-60's    Relevant CV Studies:  Pending updated echo  12/14/20: TTE 1. Left ventricular ejection fraction, by estimation, is 45 to 50%. The  left ventricle has mildly decreased function. The left ventricle  demonstrates regional wall motion abnormalities (see scoring  diagram/findings for description). Left ventricular  diastolic parameters are consistent with Grade I diastolic dysfunction  (impaired relaxation).   2. Right  ventricular systolic function is normal. The right ventricular  size is normal. There is normal pulmonary artery  systolic pressure.   3. The mitral valve is normal in structure. No evidence of mitral valve  regurgitation. No evidence of mitral stenosis.   4. The aortic valve is normal in structure. Aortic valve regurgitation is  not visualized. No aortic stenosis is present.   5. There is mild to moderate dilatation of the ascending aorta, measuring  41 mm.   6. The inferior vena cava is normal in size with greater than 50%  respiratory variability, suggesting right atrial pressure of 3 mmHg.    Laboratory Data:  High Sensitivity Troponin:   Recent Labs  Lab 06/24/23 1133 06/24/23 1430  TROPONINIHS 6 6     Chemistry Recent Labs  Lab 06/24/23 1133 06/25/23 0247  NA 140 135  K 4.0 3.9  CL 108 104  CO2 25 22  GLUCOSE 108* 164*  BUN 20 26*  CREATININE 1.34* 1.62*  CALCIUM 8.9 8.8*  GFRNONAA 56* 45*  ANIONGAP 7 9    No results for input(s): "PROT", "ALBUMIN", "AST", "ALT", "ALKPHOS", "BILITOT" in the last 168 hours. Lipids No results for input(s): "CHOL", "TRIG", "HDL", "LABVLDL", "LDLCALC", "CHOLHDL" in the last 168 hours.  Hematology Recent Labs  Lab 06/24/23 1133 06/25/23 0247  WBC 8.9 9.0  RBC 4.76 4.56  HGB 13.9 13.7  HCT 42.6 40.4  MCV 89.5 88.6  MCH 29.2 30.0  MCHC 32.6 33.9  RDW 13.9 13.7  PLT 206 211   Thyroid  Recent Labs  Lab 06/25/23 0247  TSH 0.799    BNP Recent Labs  Lab 06/24/23 1147  BNP 150.0*    DDimer No results for input(s): "DDIMER" in the last 168 hours.   Radiology/Studies:  DG Chest 2 View  Result Date: 06/24/2023 CLINICAL DATA:  Shortness of breath EXAM: CHEST - 2 VIEW COMPARISON:  10/24/2020 FINDINGS: The heart size and mediastinal contours are within normal limits. No focal airspace consolidation, pleural effusion, or pneumothorax. The visualized skeletal structures are unremarkable. IMPRESSION: No active cardiopulmonary disease. Electronically Signed   By: Duanne Guess D.O.   On: 06/24/2023 12:45     Assessment and Plan:    Advanced conduction system disease Largely asymptomatic though has been getting random momentary dizzy spells on occasion in the last 6 months Stable BP This AM is 24 hours off his coreg >> follow telemetry With advanced heart block I think he may benefit from PPM  No EP schedule availability today   hold NPO for tomorrow Echo is done, pending read QRS is narrow 92ms EP MD  see later today   He is on Eliquis for hx of recurrent PE > heparin gtt here >> off at 0500 tomorrow in anticipation of PPM  hold his Plavix (last PCI 2021) OK for ASA   Risk Assessment/Risk Scores:     For questions or updates, please contact [facility] Please consult [email or web address] for contact info under    Signed, Sheilah Pigeon, PA-C  06/25/2023 10:21 AM]  I have seen and examined this patient with Francis Dowse.  Agree with above, note added to reflect my findings.  Patient with a past medical history as above.  He initially presented with nasal congestion.  He was noted to be bradycardic.  EKG showed intermittent episodes of 2-1 AV block.  Intermittently, he has episodes where he feels fatigued and short of breath.  He was initially on carvedilol, but this has  washed out.  He does feel sudden episodes of dizziness and lightheadedness.  GEN: Well nourished, well developed, in no acute distress  HEENT: normal  Neck: no JVD, carotid bruits, or masses Cardiac: RRR; no murmurs, rubs, or gallops,no edema  Respiratory:  clear to auscultation bilaterally, normal work of breathing GI: soft, nontender, nondistended, + BS MS: no deformity or atrophy  Skin: warm and dry Neuro:  Strength and sensation are intact Psych: euthymic mood, full affect   2-1 second-degree AV block: Carvedilol has been stopped.  Heart block continues.  He would likely benefit from pacemaker implant.  Would make him n.p.o. after midnight tonight.  Plan for pacemaker implant tomorrow.   M.   MD 06/25/2023 4:18 PM

## 2023-06-25 NOTE — Progress Notes (Signed)
ANTICOAGULATION CONSULT NOTE - Follow Up  Pharmacy Consult for heparin Indication: pulmonary embolus  Allergies  Allergen Reactions   Pneumovax 23 [Pneumococcal Vac Polyvalent] Rash and Other (See Comments)    Some rash and muscle pain for 3 months     Patient Measurements: Height: 6\' 3"  (190.5 cm) Weight: 89.5 kg (197 lb 5 oz) IBW/kg (Calculated) : 84.5 Heparin Dosing Weight: 89.5 kg  Vital Signs: Temp: 97.6 F (36.4 C) (08/05 1907) Temp Source: Oral (08/05 1907) BP: 122/76 (08/05 1907) Pulse Rate: 43 (08/05 2133)  Labs: Recent Labs    06/24/23 1133 06/24/23 1430 06/24/23 1655 06/25/23 0247  HGB 13.9  --   --   --   HCT 42.6  --   --   --   PLT 206  --   --   --   APTT  --   --  34 91*  HEPARINUNFRC  --   --  >1.10* >1.10*  CREATININE 1.34*  --   --  1.62*  TROPONINIHS 6 6  --   --     Estimated Creatinine Clearance: 48.5 mL/min (A) (by C-G formula based on SCr of 1.62 mg/dL (H)).   Medical History: Past Medical History:  Diagnosis Date   Allergic rhinitis 07/19/2016   Asthma 07/19/2016   Atherosclerotic heart disease of native coronary artery without angina pectoris 07/28/2015    CAD/ MI/ CABG  Overview:  [place].  MI LAD stent. 01/2017 Overview:  He had MI with staged PCI of LAD and later RCA and LCF 2015, EF 40% Overview:  Added automatically from request for surgery [id number]   Cardiomyopathy [facility]) 07/19/2016   Overview:  EF 45% or 54% 01/2017   Chronic bronchitis (HCC)    COPD (chronic obstructive pulmonary disease) (HCC) 04/26/2015   PFT-06/27/2015-minimal obstructive airways disease with insignificant response to bronchodilator, minimal diffusion defect, normal lung volumes. FEV1/FVC 0.76, DLCO 72%   Coronary artery disease    Emphysema lung (HCC)    "treated for asthma but later found out it was emphysema" (10/15/2017)   Essential hypertension 07/19/2016   GERD (gastroesophageal reflux disease)    High risk medication use 07/19/2016   Hyperlipidemia    "left  lung"   Inguinal hernia 07/19/2016   Overview:  Mild bilateral.   Lung nodule seen on imaging study 07/28/2015   chest x-ray report from 03/05/2014 at [facility] described a vague nodular density in the left upper lobe and recommended follow-up CT scan which was never done.    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   Prediabetes 07/19/2016   Pulmonary embolism (HCC) 10/15/2017   Stage 3 chronic kidney disease (HCC) 07/19/2016   STEMI (ST elevation myocardial infarction) [facility]) 10/18/2017   Feb 2015    Medications:  Medications Prior to Admission  Medication Sig Dispense Refill Last Dose   apixaban (ELIQUIS) 5 MG TABS tablet TAKE ONE TABLET BY MOUTH TWICE DAILY 180 tablet 1 06/24/2023 at 0700   Ascorbic Acid (VITAMIN C) 1000 MG tablet Take 2,000 mg by mouth daily.   unk   atorvastatin (LIPITOR) 80 MG tablet Take 1 tablet (80 mg total) by mouth every evening. 90 tablet 3 06/23/2023   BREO ELLIPTA 200-25 MCG/ACT AEPB Inhale 1 puff into the lungs daily.   06/23/2023   carvedilol (COREG) 12.5 MG tablet Take 1.5 tablets (18.75 mg total) by mouth 2 (two) times daily with a meal. 270 tablet 3    cholecalciferol (VITAMIN  D3) 25 MCG (1000 UNIT) tablet Take 1,000 Units by mouth daily.   unk   clopidogrel (PLAVIX) 75 MG tablet Take 1 tablet (75 mg total) by mouth daily with breakfast. 90 tablet 3 06/23/2023   Cyanocobalamin (B-12 PO) Take 1 tablet by mouth daily.    06/23/2023   montelukast (SINGULAIR) 10 MG tablet Take 10 mg by mouth daily.  2 unk   omeprazole (PRILOSEC) 40 MG capsule Take 40 mg by mouth daily.   06/24/2023   tamsulosin (FLOMAX) 0.4 MG CAPS capsule Take 1 capsule (0.4 mg total) by mouth daily. 30 capsule 1 06/23/2023   valsartan (DIOVAN) 80 MG tablet Take 1 tablet (80 mg total) by mouth daily. 90 tablet 3 unk    Assessment: 72 y/o male presenting with recurrent pulmonary embolism. Patient taking Eliquis prior to [facility] for history of  recurrent PE, last dose this morning. Eliquis being held in anticipation of PPM. Baseline heparin level and aPTT ordered. CBC wnl.   8/6 AM: Heparin level >1.1 (falsely elevated) and aPTT 91 seconds (within goal) at 1500 units/hr. No signs/symptoms of bleeding reported or issues with the heparin infusion running.  Goal of Therapy:  Heparin level 0.3-0.7 units/ml aPTT 66-102 seconds Monitor platelets by anticoagulation protocol: Yes   Plan:  Start heparin infusion at 1500 units/hr  Check confirmatory aPTT in 8 hours Check heparin level and aPTT daily until levels correlate Continue to monitor H&H and platelets  Arabella Merles, PharmD. Clinical Pharmacist 06/25/2023 3:39 AM

## 2023-06-25 NOTE — Progress Notes (Addendum)
Patient Name: Derek Hart Date of Encounter: 06/25/2023 Derek Hart Cardiologist: Norman Herrlich, MD   Interval Summary  .    72 yr old male with PMH of CAD s/p multiple PCIs to pLAD, dLAD, mLCx, RCA, chronic diastolic heart failure, HTN, HLD, COPD, CKD III, unprovoked PE on chronic eliquis, who is admitted under cardiology service for type II 2nd degree AVB.   Patient states he is doing well, probably feel a little slow, but no chest pain, SOB, dizziness, syncope. He has been doing well without any exertional chest pain at home over the past 6 month. No recent infection or open wound.   Vital Signs .    Vitals:   06/24/23 2133 06/25/23 0000 06/25/23 0500 06/25/23 0805  BP:   101/63 98/67  Pulse: (!) 43   (!) 39  Resp:   18 18  Temp:   97.7 F (36.5 C) 98 F (36.7 C)  TempSrc:   Oral Oral  SpO2: 94% 94%  96%  Weight:      Height:        Intake/Output Summary (Last 24 hours) at 06/25/2023 0807 Last data filed at 06/25/2023 0630 Gross per 24 hour  Intake 167.13 ml  Output 1100 ml  Net -932.87 ml      06/24/2023   11:29 AM 09/11/2022    7:52 AM 03/15/2022    9:41 AM  Last 3 Weights  Weight (lbs) 197 lb 5 oz 197 lb 6.4 oz 193 lb 9.6 oz  Weight (kg) 89.5 kg 89.54 kg 87.816 kg      Telemetry/ECG    Telemetry today showed type II 2nd degree AVB at 40s, intermittent sinus bradycardia 40s, no new EKG - Personally Reviewed  Physical Exam .   GEN: No acute distress.   Neck: No JVD Cardiac: Bradycardiac, no murmurs, rubs, or gallops.  Respiratory: Clear to auscultation bilaterally. On room air.  GI: Soft, nontender, non-distended  MS: No leg edema  Assessment & Plan .     2:1 AVB Sinus bradycardia - presented to urgent care yesterday with nasal congestion, noted to have heart rate in 30s, overall asymptomatic - PTA coreg 18.75mg  BID held, last dose 06/24/23 7am - TSH WNL, Echo pending today  - he remains in 2nd degree AVB type II today, discussed with EP, will  see in formal consultation   CKD IIIa - renal index worsened today with Cr 1.62 from 1.34, no recent labs for baseline evaluation - hold PTA valsartan, renally dose all meds, avoid nephrotoxin    CAD - most recent PCI was done 10/25/20 with PCI to pLAD, unable cross dLAD occlusion with a wire and has good right to left collaterals from RCA, mid Lcx/ prox and mid RCA stents patent - no angina symptoms; Hs trop negative x2 so far - on chronic plavix PTA, continued; stopped coreg as above; continued PTA lipitor   Chronic diastolic heart failure  - no evidence of exacerbation - Echo 2022 with LVEF 45-50%, apical lateral segment, mid inferoseptal segment, apical septal  segment, and apex are hypokinetic;  grade I DD, normal RV and PASP, mild to moderate dilatation of the ascending aorta 41 mm  - repeat Echo pending today  - GDMT: on PTA coreg and valsartan, both held at this time  HTN - BP controlled 101/63 - 145/101, on PTA coreg and valsartan, both held at this time, trend BP for now  HLD - PTA lipitor 80mg  continued   COPD - no  evidence of exacerbation, , PTA Breo Ellipta continued, add PRN albuterol if needed   BPH - resume PTA tamsulosin today   GERD - resume PTA PPI  Hx of recurrent unprovoked PE  - home dose Eliquis held, on heparin gtt currently   DVT prophylaxis  - on heparin gtt currently   AD: Full code Dispo: pending clinical course     For questions or updates, please contact [place] [facility] Please consult [email or web address] for contact info under      Signed, Cyndi Bender, NP    Patient seen and examined.  Agree with above documentation.  On exam, patient is alert and oriented,bradycardic,no murmurs, lungs CTAB, no LE edema or JVD.  Patient remains in intermittent  2-1 AV block on telemetry with rate 30s.  Remains asymptomatic.  Will consult EP for PPM evaluation.  Check echo.  Little Ishikawa, MD

## 2023-06-25 NOTE — Progress Notes (Signed)
Echocardiogram 2D Echocardiogram has been performed.  Irish Lack, RDCS 06/25/2023, 10:45 AM

## 2023-06-26 ENCOUNTER — Encounter (HOSPITAL_COMMUNITY)
Admission: EM | Disposition: A | Discharge status: Home / Self Care | Payer: Self-pay | Source: Home / Self Care | Attending: Cardiology

## 2023-06-26 ENCOUNTER — Other Ambulatory Visit: Payer: Self-pay

## 2023-06-26 DIAGNOSIS — I441 Atrioventricular block, second degree: Secondary | ICD-10-CM | POA: Diagnosis not present

## 2023-06-26 HISTORY — PX: PACEMAKER IMPLANT: EP1218

## 2023-06-26 LAB — URINALYSIS, ROUTINE W REFLEX MICROSCOPIC
Bilirubin Urine: NEGATIVE
Glucose, UA: NEGATIVE mg/dL
Hgb urine dipstick: NEGATIVE
Ketones, ur: NEGATIVE mg/dL
Leukocytes,Ua: NEGATIVE
Nitrite: NEGATIVE
Protein, ur: NEGATIVE mg/dL
Specific Gravity, Urine: 1.008 (ref 1.005–1.030)
pH: 6 (ref 5.0–8.0)

## 2023-06-26 LAB — RESPIRATORY PANEL BY PCR

## 2023-06-26 LAB — MAGNESIUM: Magnesium: 1.7 mg/dL (ref 1.7–2.4)

## 2023-06-26 SURGERY — PACEMAKER IMPLANT

## 2023-06-26 MED ORDER — CEFAZOLIN SODIUM-DEXTROSE 1-4 GM/50ML-% IV SOLN
1.0000 g | Freq: Four times a day (QID) | INTRAVENOUS | Status: AC
  Administered 2023-06-26 – 2023-06-27 (×3): 1 g via INTRAVENOUS
  Filled 2023-06-26 (×3): qty 50

## 2023-06-26 MED ORDER — SODIUM CHLORIDE 0.9 % IV SOLN
INTRAVENOUS | Status: AC
  Filled 2023-06-26: qty 2

## 2023-06-26 MED ORDER — ATORVASTATIN CALCIUM 80 MG PO TABS: 80.0000 mg | ORAL_TABLET | Freq: Every evening | ORAL | Status: DC

## 2023-06-26 MED ORDER — POTASSIUM CHLORIDE CRYS ER 20 MEQ PO TBCR: 40.0000 meq | EXTENDED_RELEASE_TABLET | Freq: Once | ORAL | Status: DC

## 2023-06-26 MED ORDER — CEFAZOLIN SODIUM-DEXTROSE 2-4 GM/100ML-% IV SOLN
INTRAVENOUS | Status: AC
  Filled 2023-06-26: qty 100

## 2023-06-26 MED ORDER — POTASSIUM CHLORIDE CRYS ER 20 MEQ PO TBCR
40.0000 meq | EXTENDED_RELEASE_TABLET | Freq: Once | ORAL | Status: AC
  Administered 2023-06-26: 40 meq via ORAL
  Filled 2023-06-26: qty 2

## 2023-06-26 MED ORDER — MIDAZOLAM HCL 2 MG/2ML IJ SOLN
INTRAMUSCULAR | Status: DC | PRN
  Administered 2023-06-26: 1 mg via INTRAVENOUS

## 2023-06-26 MED ORDER — MIDAZOLAM HCL 2 MG/2ML IJ SOLN
INTRAMUSCULAR | Status: AC
  Filled 2023-06-26: qty 2

## 2023-06-26 MED ORDER — FENTANYL CITRATE (PF) 100 MCG/2ML IJ SOLN
INTRAMUSCULAR | Status: AC
  Filled 2023-06-26: qty 2

## 2023-06-26 MED ORDER — HEPARIN (PORCINE) IN NACL 1000-0.9 UT/500ML-% IV SOLN
INTRAVENOUS | Status: DC | PRN
  Administered 2023-06-26: 500 mL

## 2023-06-26 MED ORDER — LIDOCAINE HCL (PF) 1 % IJ SOLN
INTRAMUSCULAR | Status: AC
  Filled 2023-06-26: qty 60

## 2023-06-26 MED ORDER — LIDOCAINE HCL (PF) 1 % IJ SOLN
INTRAMUSCULAR | Status: DC | PRN
  Administered 2023-06-26: 30 mL

## 2023-06-26 MED ORDER — FENTANYL CITRATE (PF) 100 MCG/2ML IJ SOLN
INTRAMUSCULAR | Status: DC | PRN
  Administered 2023-06-26: 25 ug via INTRAVENOUS

## 2023-06-26 SURGICAL SUPPLY — 13 items
CABLE SURGICAL S-101-97-12 (CABLE) ×1 IMPLANT
CATH CPS LOCATOR 3D MED (CATHETERS) IMPLANT
HELIX LOCKING TOOL (MISCELLANEOUS) ×1
LEAD ULTIPACE 52 LPA1231/52 (Lead) IMPLANT
LEAD ULTIPACE 65 LPA1231/65 (Lead) IMPLANT
PACEMAKER ASSURITY DR-RF (Pacemaker) IMPLANT
PAD DEFIB RADIO PHYSIO CONN (PAD) ×1 IMPLANT
SHEATH 7FR PRELUDE SNAP 13 (SHEATH) IMPLANT
SHEATH 9FR PRELUDE SNAP 13 (SHEATH) IMPLANT
SHEATH PROBE COVER 6X72 (BAG) IMPLANT
SLITTER AGILIS HISPRO (INSTRUMENTS) IMPLANT
TOOL HELIX LOCKING (MISCELLANEOUS) IMPLANT
TRAY PACEMAKER INSERTION (PACKS) ×1 IMPLANT

## 2023-06-26 NOTE — Progress Notes (Addendum)
Rounding Note    Patient Name: Derek Hart Date of Encounter: 06/26/2023   [facility] Cardiologist: Norman Herrlich, MD   Subjective   NAEO, feels well, no symptoms of UTI, illness  Inpatient Medications    Scheduled Meds:  aspirin EC  81 mg Oral Daily   atorvastatin  80 mg Oral QPM   fluticasone furoate-vilanterol  1 puff Inhalation Daily   gentamicin (GARAMYCIN) 80 mg in sodium chloride 0.9 % 500 mL irrigation  80 mg Irrigation On Call   pantoprazole  40 mg Oral Daily   potassium chloride  40 mEq Oral Once   sodium chloride flush  3 mL Intravenous Q12H   tamsulosin  0.4 mg Oral Daily   Continuous Infusions:  sodium chloride 50 mL/hr at 06/25/23 1706   sodium chloride 250 mL (06/26/23 0427)    ceFAZolin (ANCEF) IV     PRN Meds: acetaminophen, ondansetron (ZOFRAN) IV, sodium chloride flush   Vital Signs    Vitals:   06/26/23 0000 06/26/23 0349 06/26/23 0400 06/26/23 0748  BP:  118/72  (!) 146/87  Pulse:  (!) 36  (!) 37  Resp:  20  18  Temp:  97.8 F (36.6 C)  97.8 F (36.6 C)  TempSrc:  Axillary  Oral  SpO2: 95% 97% 96% 97%  Weight:      Height:        Intake/Output Summary (Last 24 hours) at 06/26/2023 0823 Last data filed at 06/26/2023 0514 Gross per 24 hour  Intake 842.39 ml  Output 1050 ml  Net -207.61 ml      06/24/2023   11:29 AM 09/11/2022    7:52 AM 03/15/2022    9:41 AM  Last 3 Weights  Weight (lbs) 197 lb 5 oz 197 lb 6.4 oz 193 lb 9.6 oz  Weight (kg) 89.5 kg 89.54 kg 87.816 kg      Telemetry    SR with intermittent 2:1 AV block 30's-60's - Personally Reviewed  ECG    No new EKGs - Personally Reviewed  Physical Exam   GEN: No acute distress.   Neck: No JVD Cardiac: RRR, no murmurs, rubs, or gallops.  Respiratory: CTA b/l. GI: Soft, nontender, non-distended  MS: No edema; No deformity. Neuro:  Nonfocal  Psych: Normal affect   Labs    High Sensitivity Troponin:   Recent Labs  Lab 06/24/23 1133 06/24/23 1430   TROPONINIHS 6 6     Chemistry Recent Labs  Lab 06/24/23 1133 06/25/23 0247 06/26/23 0214  NA 140 135 137  K 4.0 3.9 3.3*  CL 108 104 108  CO2 25 22 20*  GLUCOSE 108* 164* 136*  BUN 20 26* 30*  CREATININE 1.34* 1.62* 1.60*  CALCIUM 8.9 8.8* 8.4*  GFRNONAA 56* 45* 45*  ANIONGAP 7 9 9     Lipids No results for input(s): "CHOL", "TRIG", "HDL", "LABVLDL", "LDLCALC", "CHOLHDL" in the last 168 hours.  Hematology Recent Labs  Lab 06/24/23 1133 06/25/23 0247 06/26/23 0214  WBC 8.9 9.0 13.6*  RBC 4.76 4.56 4.28  HGB 13.9 13.7 12.5*  HCT 42.6 40.4 37.2*  MCV 89.5 88.6 86.9  MCH 29.2 30.0 29.2  MCHC 32.6 33.9 33.6  RDW 13.9 13.7 14.0  PLT 206 211 182   Thyroid  Recent Labs  Lab 06/25/23 0247  TSH 0.799    BNP Recent Labs  Lab 06/24/23 1147  BNP 150.0*    DDimer No results for input(s): "DDIMER" in the last 168 hours.   Radiology  Cardiac Studies   06/25/23: TTE 1. Left ventricular ejection fraction, by estimation, is 45 to 50%. The  left ventricle has mildly decreased function. The left ventricle has no  regional wall motion abnormalities. Left ventricular diastolic parameters  are consistent with Grade I  diastolic dysfunction (impaired relaxation).   2. Right ventricular systolic function is normal. The right ventricular  size is normal. There is normal pulmonary artery systolic pressure.   3. The mitral valve is normal in structure. Trivial mitral valve  regurgitation. No evidence of mitral stenosis.   4. The aortic valve is tricuspid. Aortic valve regurgitation is not  visualized. No aortic stenosis is present.   5. Aortic dilatation noted. There is mild dilatation of the ascending  aorta, measuring 40 mm.   6. The inferior vena cava is normal in size with greater than 50%  respiratory variability, suggesting right atrial pressure of 3 mmHg.    12/14/20: TTE 1. Left ventricular ejection fraction, by estimation, is 45 to 50%. The  left ventricle  has mildly decreased function. The left ventricle  demonstrates regional wall motion abnormalities (see scoring  diagram/findings for description). Left ventricular  diastolic parameters are consistent with Grade I diastolic dysfunction  (impaired relaxation).   2. Right ventricular systolic function is normal. The right ventricular  size is normal. There is normal pulmonary artery systolic pressure.   3. The mitral valve is normal in structure. No evidence of mitral valve  regurgitation. No evidence of mitral stenosis.   4. The aortic valve is normal in structure. Aortic valve regurgitation is  not visualized. No aortic stenosis is present.   5. There is mild to moderate dilatation of the ascending aorta, measuring  41 mm.   6. The inferior vena cava is normal in size with greater than 50%  respiratory variability, suggesting right atrial pressure of 3 mmHg.   Patient Profile     72 y.o. 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 Elberta Fortis has seen the patient Mild leukocytosis without fever or symptoms of illness  proceed for today   2. Mild hypokalemia Replacement ordered  3. CAD No symptoms Resume plavix post procedure pending pocket stability Resume BB post PPM  4. Hx of recurrent PE Eliquis out patient > heparin here > held this AM for PPM implant Resume Eliquis post procedure, pending pocket  stability    Further care as per primary team  For questions or updates, please contact [facility] Please consult [email or web address] for contact info under        Signed, Sheilah Pigeon, PA-C  06/26/2023, 8:23 AM    I have seen and examined this patient with Francis Dowse.  Agree with above, note added to reflect my findings.  Remains in 2-1 block.  Feeling well without acute issues.  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  He  need pacemaker implant.  Risk and benefits have been discussed.  Risk of bleeding, tamponade, infection, pneumothorax, lead dislodgment, MI, death, stroke, renal failure.  He understands these risks and has agreed to the procedure. Coronary artery disease: No current chest pain Recurrent pulmonary embolism: Resume Eliquis postprocedure   M.  MD 06/26/2023 10:44 AM

## 2023-06-26 NOTE — Progress Notes (Addendum)
Patient Name: Derek Hart Date of Encounter: 06/26/2023 [facility] Cardiologist: Norman Herrlich, MD   Interval Summary  .    72 yr old male with PMH of CAD s/p multiple PCIs to pLAD, dLAD, mLCx, RCA, chronic HFmrEF, HTN, HLD, COPD, CKD III, unprovoked PE on chronic eliquis, who is admitted under cardiology service for type II 2nd degree AVB.   Patient observed standing in the room fixing his IV pump this morning, he states he feels well, denied any fever, chills, nausea, emesis, abdominal pain, diarrhea, dysuria. He has a light non-productive cough. He had agree with PPM implant yesterday. He offers no compliant of chest pain, dizziness, syncope.   Vital Signs .    Vitals:   06/26/23 0000 06/26/23 0349 06/26/23 0400 06/26/23 0748  BP:  118/72  (!) 146/87  Pulse:  (!) 36  (!) 37  Resp:  20  18  Temp:  97.8 F (36.6 C)  97.8 F (36.6 C)  TempSrc:  Axillary  Oral  SpO2: 95% 97% 96% 97%  Weight:      Height:        Intake/Output Summary (Last 24 hours) at 06/26/2023 0811 Last data filed at 06/26/2023 0514 Gross per 24 hour  Intake 842.39 ml  Output 1050 ml  Net -207.61 ml      06/24/2023   11:29 AM 09/11/2022    7:52 AM 03/15/2022    9:41 AM  Last 3 Weights  Weight (lbs) 197 lb 5 oz 197 lb 6.4 oz 193 lb 9.6 oz  Weight (kg) 89.5 kg 89.54 kg 87.816 kg      Telemetry/ECG    Telemetry today showed type II 2nd degree AVB at 40s, 2:1, one episode of AT lasting 8 seconds noted, no new EKG today - Personally Reviewed  Physical Exam .   GEN: No acute distress.   Neck: No JVD Cardiac: Bradycardiac, no murmurs, rubs, or gallops.  Respiratory: Clear to auscultation bilaterally. On room air. Speaks full sentence  GI: Soft, nontender, non-distended  MS: No leg edema  Assessment & Plan .     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  Sinus bradycardia - presented to urgent care 8/5 with nasal congestion, noted to have heart rate in 30s, overall asymptomatic - PTA coreg 18.75mg  BID held, last  dose 06/24/23 7am - TSH WNL - Echo 06/25/23 showed LVEF 45-50%, no RWMA, grade I DD, normal RV and PASP, trivial MR, mild dilatation of ascending aorta 40mm - EP consulted, remains in 2:1 Type II AVB despite beta blocker wash out, plan for PPM today   CKD IIIa - renal index worsened 8/6 with Cr 1.62 from 1.34, no recent labs for baseline evaluation, s/p gentle IVF hydration, Cr 1.6 today, likely baseline/will continue trend BMP, UOP 1L over the past 24 hours, will check urine study today  - hold PTA valsartan, renally dose all meds, avoid nephrotoxin   Hypokalemia, new  - K 3.3 today, will replace PO K , check Mag level today   Leukocytosis, new  - WBC now elevated 13600, 8900 at admission - afebrile, c/o slight cough, no other infectious symptoms  - will check UA and viral respiratory panel today, monitor fever curve, symptoms, and trend WBC   CAD - most recent PCI was done 10/25/20 with PCI to pLAD, unable cross dLAD occlusion with a wire and has good right to left collaterals from RCA, mid Lcx/ prox and mid RCA stents patent - no angina symptoms; Hs trop negative  x2 this admission  - on chronic plavix PTA, held for PPM implant today and switched to ASA 81mg  for now; stopped coreg as above; continued PTA lipitor   Chronic HFmrEF - no evidence of exacerbation - Echo 2022 with LVEF 45-50%, apical lateral segment, mid inferoseptal segment, apical septal segment, and apex are hypokinetic;  grade I DD, normal RV and PASP, mild to moderate dilatation of the ascending aorta 41 mm  - repeat Echo 06/25/23 LVEF 45-50%, no RWMA, grade I DD, normal RV and PASP, trivial MR, mild dilatation of ascending aorta 40mm (stable) - GDMT: on PTA coreg and valsartan, both held at this time  HTN - BP controlled 94/55-146/87, on PTA coreg and valsartan, both held at this time, trend BP for now  HLD - PTA lipitor 80mg  continued   COPD - no evidence of exacerbation, , PTA Breo Ellipta continued, add PRN  albuterol if needed   BPH - resume PTA tamsulosin today   GERD - resume PTA PPI  Hx of recurrent unprovoked PE  - home dose Eliquis held, on heparin gtt , held since 5am today for PPM implant   DVT prophylaxis  - heparin gtt held since 5am today for PPM implant, plan to resume Eliquis post PPM implant once cleared by EP    AD: Full code Dispo: DC home likely in 24 hours   For questions or updates, please contact [facility] Please consult [email or web address] for contact info under      Signed, Cyndi Bender, NP     Patient seen and examined.  Agree with above documentation.  On exam, patient is alert and oriented, bradycardic, regular rhythm,  no murmurs, lungs CTAB, no LE edema or JVD.  Echo shows stable systolic dysfunction, EF 45 to 50%.  Remains in 2:1 block, rate 30s.  Planning pacemaker today.  Little Ishikawa, MD

## 2023-06-26 NOTE — H&P (View-Only) (Signed)
Rounding Note    Patient Name: Derek Hart Date of Encounter: 06/26/2023   [facility] Cardiologist: Norman Herrlich, MD   Subjective   NAEO, feels well, no symptoms of UTI, illness  Inpatient Medications    Scheduled Meds:  aspirin EC  81 mg Oral Daily   atorvastatin  80 mg Oral QPM   fluticasone furoate-vilanterol  1 puff Inhalation Daily   gentamicin (GARAMYCIN) 80 mg in sodium chloride 0.9 % 500 mL irrigation  80 mg Irrigation On Call   pantoprazole  40 mg Oral Daily   potassium chloride  40 mEq Oral Once   sodium chloride flush  3 mL Intravenous Q12H   tamsulosin  0.4 mg Oral Daily   Continuous Infusions:  sodium chloride 50 mL/hr at 06/25/23 1706   sodium chloride 250 mL (06/26/23 0427)    ceFAZolin (ANCEF) IV     PRN Meds: acetaminophen, ondansetron (ZOFRAN) IV, sodium chloride flush   Vital Signs    Vitals:   06/26/23 0000 06/26/23 0349 06/26/23 0400 06/26/23 0748  BP:  118/72  (!) 146/87  Pulse:  (!) 36  (!) 37  Resp:  20  18  Temp:  97.8 F (36.6 C)  97.8 F (36.6 C)  TempSrc:  Axillary  Oral  SpO2: 95% 97% 96% 97%  Weight:      Height:        Intake/Output Summary (Last 24 hours) at 06/26/2023 0823 Last data filed at 06/26/2023 0514 Gross per 24 hour  Intake 842.39 ml  Output 1050 ml  Net -207.61 ml      06/24/2023   11:29 AM 09/11/2022    7:52 AM 03/15/2022    9:41 AM  Last 3 Weights  Weight (lbs) 197 lb 5 oz 197 lb 6.4 oz 193 lb 9.6 oz  Weight (kg) 89.5 kg 89.54 kg 87.816 kg      Telemetry    SR with intermittent 2:1 AV block 30's-60's - Personally Reviewed  ECG    No new EKGs - Personally Reviewed  Physical Exam   GEN: No acute distress.   Neck: No JVD Cardiac: RRR, no murmurs, rubs, or gallops.  Respiratory: CTA b/l. GI: Soft, nontender, non-distended  MS: No edema; No deformity. Neuro:  Nonfocal  Psych: Normal affect   Labs    High Sensitivity Troponin:   Recent Labs  Lab 06/24/23 1133 06/24/23 1430   TROPONINIHS 6 6     Chemistry Recent Labs  Lab 06/24/23 1133 06/25/23 0247 06/26/23 0214  NA 140 135 137  K 4.0 3.9 3.3*  CL 108 104 108  CO2 25 22 20*  GLUCOSE 108* 164* 136*  BUN 20 26* 30*  CREATININE 1.34* 1.62* 1.60*  CALCIUM 8.9 8.8* 8.4*  GFRNONAA 56* 45* 45*  ANIONGAP 7 9 9     Lipids No results for input(s): "CHOL", "TRIG", "HDL", "LABVLDL", "LDLCALC", "CHOLHDL" in the last 168 hours.  Hematology Recent Labs  Lab 06/24/23 1133 06/25/23 0247 06/26/23 0214  WBC 8.9 9.0 13.6*  RBC 4.76 4.56 4.28  HGB 13.9 13.7 12.5*  HCT 42.6 40.4 37.2*  MCV 89.5 88.6 86.9  MCH 29.2 30.0 29.2  MCHC 32.6 33.9 33.6  RDW 13.9 13.7 14.0  PLT 206 211 182   Thyroid  Recent Labs  Lab 06/25/23 0247  TSH 0.799    BNP Recent Labs  Lab 06/24/23 1147  BNP 150.0*    DDimer No results for input(s): "DDIMER" in the last 168 hours.   Radiology  Cardiac Studies   06/25/23: TTE 1. Left ventricular ejection fraction, by estimation, is 45 to 50%. The  left ventricle has mildly decreased function. The left ventricle has no  regional wall motion abnormalities. Left ventricular diastolic parameters  are consistent with Grade I  diastolic dysfunction (impaired relaxation).   2. Right ventricular systolic function is normal. The right ventricular  size is normal. There is normal pulmonary artery systolic pressure.   3. The mitral valve is normal in structure. Trivial mitral valve  regurgitation. No evidence of mitral stenosis.   4. The aortic valve is tricuspid. Aortic valve regurgitation is not  visualized. No aortic stenosis is present.   5. Aortic dilatation noted. There is mild dilatation of the ascending  aorta, measuring 40 mm.   6. The inferior vena cava is normal in size with greater than 50%  respiratory variability, suggesting right atrial pressure of 3 mmHg.    12/14/20: TTE 1. Left ventricular ejection fraction, by estimation, is 45 to 50%. The  left ventricle  has mildly decreased function. The left ventricle  demonstrates regional wall motion abnormalities (see scoring  diagram/findings for description). Left ventricular  diastolic parameters are consistent with Grade I diastolic dysfunction  (impaired relaxation).   2. Right ventricular systolic function is normal. The right ventricular  size is normal. There is normal pulmonary artery systolic pressure.   3. The mitral valve is normal in structure. No evidence of mitral valve  regurgitation. No evidence of mitral stenosis.   4. The aortic valve is normal in structure. Aortic valve regurgitation is  not visualized. No aortic stenosis is present.   5. There is mild to moderate dilatation of the ascending aorta, measuring  41 mm.   6. The inferior vena cava is normal in size with greater than 50%  respiratory variability, suggesting right atrial pressure of 3 mmHg.   Patient Profile     72 y.o. 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 Elberta Fortis has seen the patient Mild leukocytosis without fever or symptoms of illness  proceed for today   2. Mild hypokalemia Replacement ordered  3. CAD No symptoms Resume plavix post procedure pending pocket stability Resume BB post PPM  4. Hx of recurrent PE Eliquis out patient > heparin here > held this AM for PPM implant Resume Eliquis post procedure, pending pocket  stability    Further care as per primary team  For questions or updates, please contact [facility] Please consult [email or web address] for contact info under        Signed, Sheilah Pigeon, PA-C  06/26/2023, 8:23 AM    I have seen and examined this patient with Francis Dowse.  Agree with above, note added to reflect my findings.  Remains in 2-1 block.  Feeling well without acute issues.  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  He  need pacemaker implant.  Risk and benefits have been discussed.  Risk of bleeding, tamponade, infection, pneumothorax, lead dislodgment, MI, death, stroke, renal failure.  He understands these risks and has agreed to the procedure. Coronary artery disease: No current chest pain Recurrent pulmonary embolism: Resume Eliquis postprocedure   M.  MD 06/26/2023 10:44 AM

## 2023-06-26 NOTE — Discharge Instructions (Signed)
After Your Pacemaker   You have a [employer] Pacemaker  ACTIVITY Do not lift your arm above shoulder height for 1 week after your procedure. After 7 days, you may progress as below.  You should remove your sling 24 hours after your procedure, unless otherwise instructed by your provider.     Wednesday July 03, 2023  Thursday July 04, 2023 Friday July 05, 2023 Saturday July 06, 2023   Do not lift, push, pull, or carry anything over 10 pounds with the affected arm until 6 weeks (Wednesday August 07, 2023 ) after your procedure.   You may drive AFTER your wound check, unless you have been told otherwise by your provider.   Ask your healthcare provider when you can go back to work   INCISION/Dressing If you are on a blood thinner such as Coumadin, Xarelto, Eliquis, Plavix, or Pradaxa please confirm with your provider when this should be resumed.   Monitor your Pacemaker site for redness, swelling, and drainage. Call the device clinic at [phone] if you experience these symptoms or fever/chills.  If your incision is sealed with Steri-strips or staples, you may shower 7 days after your procedure or when told by your provider. Do not remove the steri-strips or let the shower hit directly on your site. You may wash around your site with soap and water.     Avoid lotions, ointments, or perfumes over your incision until it is well-healed.  You may use a hot tub or a pool AFTER your wound check appointment if the incision is completely closed.  Pacemaker Alerts:  Some alerts are vibratory and others beep. These are NOT emergencies. Please call our office to let us know. If this occurs at night or on weekends, it can wait until the next business day. Send a remote transmission.  If your device is capable of reading fluid status (for heart failure), you will be offered monthly monitoring to review this with you.   DEVICE MANAGEMENT Remote monitoring is used to monitor your pacemaker  from home. This monitoring is scheduled every 91 days by our office. It allows [place] to keep an eye on the functioning of your device to ensure it is working properly. You will routinely see your Electrophysiologist annually (more often if necessary).   You should receive your ID card for your new device in 4-8 weeks. Keep this card with you at all times once received. Consider wearing a medical alert bracelet or necklace.  Your Pacemaker may be MRI compatible. This will be discussed at your next office visit/wound check.  You should avoid contact with strong electric or magnetic fields.   Do not use amateur (ham) radio equipment or electric (arc) welding torches. MP3 player headphones with magnets should not be used. Some devices are safe to use if held at least 12 inches (30 cm) from your Pacemaker. These include power tools, lawn mowers, and speakers. If you are unsure if something is safe to use, ask your health care provider.  When using your cell phone, hold it to the ear that is on the opposite side from the Pacemaker. Do not leave your cell phone in a pocket over the Pacemaker.  You may safely use electric blankets, heating pads, computers, and microwave ovens.  Call the office right away if: You have chest pain. You feel more short of breath than you have felt before. You feel more light-headed than you have felt before. Your incision starts to open up.  This information is  not intended to replace advice given to you by your health care provider. Make sure you discuss any questions you have with your health care provider.

## 2023-06-26 NOTE — Interval H&P Note (Signed)
History and Physical Interval Note:  06/26/2023 12:19 PM  Derek Hart  has presented today for surgery, with the diagnosis of heart block.  The various methods of treatment have been discussed with the patient and family. After consideration of risks, benefits and other options for treatment, the patient has consented to  Procedure(s): PACEMAKER IMPLANT (N/A) as a surgical intervention.  The patient's history has been reviewed, patient examined, no change in status, stable for surgery.  I have reviewed the patient's chart and labs.  Questions were answered to the patient's satisfaction.      [employer]

## 2023-06-27 ENCOUNTER — Inpatient Hospital Stay (HOSPITAL_COMMUNITY): Payer: Medicare PPO

## 2023-06-27 ENCOUNTER — Other Ambulatory Visit: Payer: Self-pay | Admitting: Physician Assistant

## 2023-06-27 ENCOUNTER — Other Ambulatory Visit (HOSPITAL_COMMUNITY): Payer: Self-pay

## 2023-06-27 ENCOUNTER — Encounter (HOSPITAL_COMMUNITY): Payer: Self-pay | Admitting: Cardiology

## 2023-06-27 DIAGNOSIS — N179 Acute kidney failure, unspecified: Secondary | ICD-10-CM

## 2023-06-27 DIAGNOSIS — I441 Atrioventricular block, second degree: Secondary | ICD-10-CM | POA: Diagnosis not present

## 2023-06-27 MED ORDER — CARVEDILOL 12.5 MG PO TABS: 12.5000 mg | ORAL_TABLET | Freq: Two times a day (BID) | ORAL | Status: DC

## 2023-06-27 MED ORDER — ASPIRIN 81 MG PO TBEC: 81.0000 mg | DELAYED_RELEASE_TABLET | Freq: Every day | ORAL | Status: AC

## 2023-06-27 MED ORDER — PANTOPRAZOLE SODIUM 40 MG PO TBEC
40.0000 mg | DELAYED_RELEASE_TABLET | Freq: Every day | ORAL | 5 refills | Status: DC
  Filled 2023-06-27: qty 30, 30d supply, fill #0

## 2023-06-27 MED ORDER — CARVEDILOL 12.5 MG PO TABS
12.5000 mg | ORAL_TABLET | Freq: Two times a day (BID) | ORAL | Status: DC
  Administered 2023-06-27: 12.5 mg via ORAL
  Filled 2023-06-27: qty 1

## 2023-06-27 MED ORDER — AMLODIPINE BESYLATE 5 MG PO TABS: 5.0000 mg | ORAL_TABLET | Freq: Every day | ORAL | Status: DC

## 2023-06-27 NOTE — Plan of Care (Signed)
  Problem: Education: Goal: Understanding of cardiac disease, CV risk reduction, and recovery process will improve Outcome: Progressing Goal: Individualized Educational Video(s) Outcome: Progressing   Problem: Activity: Goal: Ability to tolerate increased activity will improve Outcome: Progressing   Problem: Cardiac: Goal: Ability to achieve and maintain adequate cardiovascular perfusion will improve Outcome: Progressing   Problem: Health Behavior/Discharge Planning: Goal: Ability to safely manage health-related needs after discharge will improve Outcome: Progressing   Problem: Education: Goal: Knowledge of General Education information will improve Description: Including pain rating scale, medication(s)/side effects and non-pharmacologic comfort measures Outcome: Progressing   Problem: Health Behavior/Discharge Planning: Goal: Ability to manage health-related needs will improve Outcome: Progressing   Problem: Clinical Measurements: Goal: Ability to maintain clinical measurements within normal limits will improve Outcome: Progressing Goal: Will remain free from infection Outcome: Progressing Goal: Diagnostic test results will improve Outcome: Progressing Goal: Respiratory complications will improve Outcome: Progressing Goal: Cardiovascular complication will be avoided Outcome: Progressing   Problem: Activity: Goal: Risk for activity intolerance will decrease Outcome: Progressing   Problem: Nutrition: Goal: Adequate nutrition will be maintained Outcome: Progressing   Problem: Coping: Goal: Level of anxiety will decrease Outcome: Progressing   Problem: Elimination: Goal: Will not experience complications related to bowel motility Outcome: Progressing Goal: Will not experience complications related to urinary retention Outcome: Progressing   Problem: Pain Managment: Goal: General experience of comfort will improve Outcome: Progressing   Problem:  Safety: Goal: Ability to remain free from injury will improve Outcome: Progressing   Problem: Skin Integrity: Goal: Risk for impaired skin integrity will decrease Outcome: Progressing   Problem: Education: Goal: Knowledge of cardiac device and self-care will improve Outcome: Progressing Goal: Ability to safely manage health related needs after discharge will improve Outcome: Progressing Goal: Individualized Educational Video(s) Outcome: Progressing   Problem: Cardiac: Goal: Ability to achieve and maintain adequate cardiopulmonary perfusion will improve Outcome: Progressing

## 2023-06-27 NOTE — Progress Notes (Signed)
Delivered patient's TOC meds to the [facility], Flow RN.

## 2023-06-27 NOTE — Discharge Summary (Addendum)
DISCHARGE SUMMARY    Patient ID: Derek Hart,  MRN: [id number], DOB/AGE: 05/27/1951 72 y.o.  Admit date: 06/24/2023 Discharge date: 06/27/2023  Primary Care Physician: Gordan Payment., MD  Primary Cardiologist: Dr. Dulce Sellar Electrophysiologist: new > Dr. Elberta Fortis  Primary Discharge Diagnosis:  Mobitz II AV block Nasal congestion  Secondary Discharge Diagnosis:  CAD HTN HLD  Allergies  Allergen Reactions   Pneumovax 23 [Pneumococcal Vac Polyvalent] Rash and Other (See Comments)    Some rash and muscle pain for 3 months      Procedures This Admission:  1.  Implantation of a Abbott dual chamber PPM on 06/26/23 by Dr Elberta Fortis.   There were no immediate post procedure complications. CXR on 06/27/23 demonstrated no pneumothorax status post device implantation.   Brief HPI: Derek Hart is a 72 y.o. male w/PMHx as above sought medical attention for his "usual" head congestion/allergy type syndrome that he  typically get seen in an [facility] for a steroid shot, there he was incidentally found bradycardic and sent to the ER.   Hospital Course:  The patient was admitted his home coreg stopped, labs largely unremarkable with mild AKI, no CP, neg HS Trops.  He was observed to have intermittent 2:1 AV block c/w Mobitz II heart block with rates to the 40's reporting minimal symptoms if any.  He was given a dose of decadron for his congestion with resolution.  NO symptoms of illness.  Despite washout of his BB, intermittent advanced AV block persisted and EP was consulted.  TTE updated with LVEF 45-50%, cc/w his last echo.  Recommended PPM.  He underwent implantation of a PPM with details as outlined in the procedure report.  He  was monitored on telemetry overnight which demonstrated SR/V pacing, SR.  Left chest was without hematoma or ecchymosis.  The device was interrogated and found to be functioning normally.  CXR was obtained and demonstrated no pneumothorax status post device  implantation.  No SOB, lungs sound clear on exam, O2 sat on  RA 97%, encourage OOB and mobilization once, staying within LUE restrictions.  Wound care, arm mobility, and restrictions were reviewed with the patient.  The patient feels well, denies any CP/SOB, with minimal site discomfort.  He was examined by Dr. Elberta Fortis and considered stable for discharge to home.   Resume Home coreg Resume Plavix Sunday 06/30/23 Resume Eliquis Sunday 06/30/23 In d/w cardiology team MD, advised ECASA 81mg  daily until Plavix/Eliquis resumed, pt was advised, he has some at home  In review with pharmacy, recommends changing his omeprazole to pantoprazole 2/2 his clopidogrel.   Physical Exam: Vitals:   06/27/23 0008 06/27/23 0524 06/27/23 0819 06/27/23 0826  BP: (!) 133/90  (!) 150/96   Pulse: 76 68 70   Resp: 18 18 17 18   Temp: 97.8 F (36.6 C) 98.1 F (36.7 C) 98.1 F (36.7 C)   TempSrc: Oral Oral Oral   SpO2: 94%  95%   Weight:      Height:        GEN- The patient is well appearing, alert and oriented x 3 today.   HEENT: normocephalic, atraumatic; sclera clear, conjunctiva pink; hearing intact; oropharynx clear; neck supple, no JVP Lungs- CTA b/l CTA b/l, normal work of breathing.  No wheezes, rales, rhonchi Heart- RRR, no murmurs, rubs or gallops, PMI not laterally displaced GI- soft, non-tender, non-distended Extremities- no clubbing, cyanosis, or edema MS- no significant deformity or atrophy Skin- warm and dry, no rash or lesion,  left chest without hematoma/ecchymosis Psych- euthymic mood, full affect Neuro- no gross deficits   Labs:   Lab Results  Component Value Date   WBC 10.2 06/27/2023   HGB 13.5 06/27/2023   HCT 39.9 06/27/2023   MCV 90.1 06/27/2023   PLT 162 06/27/2023    Recent Labs  Lab 06/27/23 0207  NA 138  K 3.8  CL 108  CO2 21*  BUN 24*  CREATININE 1.51*  CALCIUM 8.4*  GLUCOSE 117*    Discharge Medications:  Allergies as of 06/27/2023       Reactions    Pneumovax 23 [pneumococcal Vac Polyvalent] Rash, Other (See Comments)   Some rash and muscle pain for 3 months         Medication List     STOP taking these medications    omeprazole 40 MG capsule Commonly known as: PRILOSEC Replaced by: pantoprazole 40 MG tablet       TAKE these medications    aspirin EC 81 MG tablet Take 1 tablet (81 mg total) by mouth daily for 2 days. Swallow whole. Take on 06/27/23, 06/28/23, 06/29/23 ONLY Stop this once you have resumed you plavix and Eliquis (on 06/30/23)   atorvastatin 80 MG tablet Commonly known as: LIPITOR Take 1 tablet (80 mg total) by mouth every evening.   B-12 PO Take 1 tablet by mouth daily.   Breo Ellipta 200-25 MCG/ACT Aepb Generic drug: fluticasone furoate-vilanterol Inhale 1 puff into the lungs daily.   carvedilol 12.5 MG tablet Commonly known as: COREG Take 1.5 tablets (18.75 mg total) by mouth 2 (two) times daily with a meal.   cholecalciferol 25 MCG (1000 UNIT) tablet Commonly known as: VITAMIN D3 Take 1,000 Units by mouth daily.   clopidogrel 75 MG tablet Commonly known as: PLAVIX Take 1 tablet (75 mg total) by mouth daily with breakfast. Notes to patient: Resume 06/30/23   Eliquis 5 MG Tabs tablet Generic drug: apixaban TAKE ONE TABLET BY MOUTH TWICE DAILY Notes to patient: Resume Sunday 06/30/23   montelukast 10 MG tablet Commonly known as: SINGULAIR Take 10 mg by mouth daily.   pantoprazole 40 MG tablet Commonly known as: PROTONIX Take 1 tablet (40 mg total) by mouth daily. Replaces: omeprazole 40 MG capsule   tamsulosin 0.4 MG Caps capsule Commonly known as: FLOMAX Take 1 capsule (0.4 mg total) by mouth daily.   valsartan 80 MG tablet Commonly known as: DIOVAN Take 1 tablet (80 mg total) by mouth daily.   vitamin C 1000 MG tablet Take 2,000 mg by mouth daily.        Disposition: Home Discharge Instructions     Diet - low sodium heart healthy   Complete by: As directed    Increase  activity slowly   Complete by: As directed         Duration of Discharge Encounter: Greater than 30 minutes including physician time.  Norma Fredrickson, PA-C 06/27/2023 9:13 AM   I have seen and examined this patient with Francis Dowse.  Agree with above, note added to reflect my findings.  On exam, RRR, device site clean, dry, intact.  She is now status post Abbott dual-chamber pacemaker for 2-1 second-degree AV block.  Device functioning appropriately.  Chest x-ray and interrogation without issue.  Plan for discharge today with follow-up in device clinic.   M.  MD 06/27/2023 9:57 AM

## 2023-06-27 NOTE — Care Management Important Message (Signed)
Important Message  Patient Details  Name: Derek Hart MRN: [id number] Date of Birth: 1951/03/28   Medicare Important Message Given:  Yes     Renie Ora 06/27/2023, 8:28 AM

## 2023-07-10 ENCOUNTER — Ambulatory Visit: Payer: Medicare PPO | Attending: Internal Medicine

## 2023-07-10 DIAGNOSIS — I441 Atrioventricular block, second degree: Secondary | ICD-10-CM

## 2023-07-10 LAB — CUP PACEART INCLINIC DEVICE CHECK
Battery Remaining Longevity: 135 mo
Battery Voltage: 3.05 V
Brady Statistic RA Percent Paced: 8.9 %
Brady Statistic RV Percent Paced: 19 %
Date Time Interrogation Session: 20240821143937
Implantable Lead Connection Status: 753985
Implantable Lead Connection Status: 753985
Implantable Lead Implant Date: 20240807
Implantable Lead Implant Date: 20240807
Implantable Lead Location: 753859
Implantable Lead Location: 753860
Implantable Pulse Generator Implant Date: 20240807
Lead Channel Impedance Value: 462.5 Ohm
Lead Channel Impedance Value: 537.5 Ohm
Lead Channel Pacing Threshold Amplitude: 0.75 V
Lead Channel Pacing Threshold Amplitude: 0.75 V
Lead Channel Pacing Threshold Amplitude: 0.75 V
Lead Channel Pacing Threshold Amplitude: 0.75 V
Lead Channel Pacing Threshold Pulse Width: 0.5 ms
Lead Channel Pacing Threshold Pulse Width: 0.5 ms
Lead Channel Pacing Threshold Pulse Width: 0.5 ms
Lead Channel Pacing Threshold Pulse Width: 0.5 ms
Lead Channel Sensing Intrinsic Amplitude: 3.9 mV
Lead Channel Sensing Intrinsic Amplitude: 6 mV
Lead Channel Setting Pacing Amplitude: 0.875
Lead Channel Setting Pacing Amplitude: 3.5 V
Lead Channel Setting Pacing Pulse Width: 0.5 ms
Lead Channel Setting Sensing Sensitivity: 2 mV
Pulse Gen Model: 2272
Pulse Gen Serial Number: 5819145

## 2023-07-10 NOTE — Progress Notes (Unsigned)
Cardiology Office Note:    Date:  07/11/2023   ID:  Derek Hart, DOB Sep 30, 1951, MRN [id number]  PCP:  Gordan Payment., MD  Cardiologist:  Norman Herrlich, MD    Referring MD: Gordan Payment., MD    ASSESSMENT:    1. Second degree heart block   2. Pacemaker   3. Coronary artery disease involving native coronary artery of native heart with angina pectoris (HCC)   4. Hypertensive heart disease with chronic combined systolic and diastolic congestive heart failure (HCC)   5. Mixed hyperlipidemia   6. Chronic anticoagulation    PLAN:    In order of problems listed above:  New problem with minimally symptomatic second-degree AV block recent permanent pacemaker stable Follow-up device interrogation and programming October Stable CAD he has had multiple PCI's in the past he will continue his combined antiplatelet anticoagulant therapy along with beta-blocker and lipid-lowering at this high intensity statin. Lipid profile 04/25/2023 LDL of 82 cholesterol 136 triglycerides 84 HDL 38 at target Continue his combined antiplatelet anticoagulant therapy for recurrent venous thromboembolism and recurrent ACS multiple PCI's   Next appointment: He will see me in 6 months   Medication Adjustments/Labs and Tests Ordered: Current medicines are reviewed at length with the patient today.  Concerns regarding medicines are outlined above.  No orders of the defined types were placed in this encounter.  No orders of the defined types were placed in this encounter.    History of Present Illness:    Derek Hart is a 72 y.o. male with a hx of CAD with multiple PCI and stent involving LAD right coronary artery left circumflex and cardiomyopathy most recent ejection fraction 45 to 50% last seen 09/11/2022.  Recently he presented with Mobitz 2 second-degree AV block with permanent pacemaker insertion dual-chamber 06/26/2023 by Dr. Elberta Fortis with no postoperative complications.Echocardiogram 06/25/2019 for  an ejection fraction of 45 to 50%.   EKG 06/25/2023 showed 2-1 AV block.  The day he was noted to have a heart rate of 34 he was asymptomatic Previously he has had episodes of transient weakness but had not lost consciousness He feels back to normal he has no pacemaker awareness edema shortness of breath chest pain palpitation or syncope He has follow-up for his device October Past Medical History:  Diagnosis Date   Allergic rhinitis 07/19/2016   Asthma 07/19/2016   Atherosclerotic heart disease of native coronary artery without angina pectoris 07/28/2015    CAD/ MI/ CABG  Overview:  Quida Glasser.  MI LAD stent. 01/2017 Overview:  He had MI with staged PCI of LAD and later RCA and LCF 2015, EF 40% Overview:  Added automatically from request for surgery [id number]   Cardiomyopathy [facility]) 07/19/2016   Overview:  EF 45% or 54% 01/2017   Chronic bronchitis (HCC)    COPD (chronic obstructive pulmonary disease) (HCC) 04/26/2015   PFT-06/27/2015-minimal obstructive airways disease with insignificant response to bronchodilator, minimal diffusion defect, normal lung volumes. FEV1/FVC 0.76, DLCO 72%   Coronary artery disease    Emphysema lung (HCC)    "treated for asthma but later found out it was emphysema" (10/15/2017)   Essential hypertension 07/19/2016   GERD (gastroesophageal reflux disease)    High risk medication use 07/19/2016   Hyperlipidemia    "left lung"   Inguinal hernia 07/19/2016   Overview:  Mild bilateral.   Lung nodule seen on imaging study 07/28/2015   chest x-ray report from 03/05/2014 at [facility] described a vague nodular density in the left  upper lobe and recommended follow-up CT scan which was never done.    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   atorvastatin (LIPITOR) 80 MG tablet Take 1 tablet (80 mg total) by mouth every evening.   BREO ELLIPTA 200-25 MCG/ACT AEPB Inhale 1 puff into the lungs daily.   carvedilol (COREG) 12.5 MG tablet Take 1.5 tablets (18.75 mg total) by mouth 2 (two) times daily with a meal.   cholecalciferol (VITAMIN D3) 25 MCG (1000 UNIT) tablet Take 1,000 Units by mouth daily.   clopidogrel (PLAVIX) 75 MG tablet Take 1 tablet (75 mg total) by mouth daily with breakfast.   Cyanocobalamin (B-12 PO) Take 1 tablet by mouth daily.    montelukast (SINGULAIR) 10 MG tablet Take 10 mg by mouth daily.   pantoprazole (PROTONIX) 40 MG tablet Take 1 tablet (40 mg total) by mouth daily.   tamsulosin (FLOMAX) 0.4 MG CAPS capsule Take 1 capsule (0.4 mg total) by mouth daily.   valsartan (DIOVAN) 80 MG tablet Take 1 tablet (80 mg total) by mouth daily.      EKGs/Labs/Other Studies Reviewed:    The following studies were reviewed today:  Cardiac Studies & Procedures   CARDIAC CATHETERIZATION  CARDIAC CATHETERIZATION 10/25/2020  Narrative  Previously placed Mid RCA stent (unknown type) is widely patent.  Previously placed Prox RCA-1 stent (unknown type) is widely patent.  Prox RCA-2 lesion is 30% stenosed.  Dist RCA lesion is 35% stenosed.  RPDA lesion is 60% stenosed.  1st RPL lesion is 80% stenosed.  Previously placed Mid Cx to Dist Cx stent (unknown type) is widely patent.  1st Mrg lesion is 95% stenosed.  2nd Mrg lesion is 50% stenosed.  3rd Mrg lesion is 40% stenosed.  Prox LAD to Mid LAD lesion is 95% stenosed.  A drug-eluting stent was successfully placed using a STENT RESOLUTE ONYX 3.5X15.  Post intervention, there is a 0%  residual stenosis.  Dist LAD lesion is 99% stenosed.  Mid LAD to Dist LAD lesion is 50% stenosed.  LV end diastolic pressure is normal.  1. 3 vessel CAD - the culprit lesion is the proximal LAD with 95% in stent restenosis. - 99% occlusion of the distal LAD stent with good right to left collaterals from the RCA - diffuse 95% stenosis in tiny OM1. Patent stent in the mid LCx - Patent stents in the proximal and mid RCA - 80% stenosis in a very small PL branch. 60% ostial PDA 2. Normal LVEDP 3. Successful PCI of the proximal LAD with IVUS guidance and DES x 1 4. Unable to cross the distal LAD occlusion with a wire. This portion of the vessel is well collateralized.  Plan: DAPT with ASA for one month and Plavix for 12 months. May resume DOAC tomorrow. Plan to switch Eliquis to Xarelto due to formulary coverage. Anticipate DC in am.  Findings Coronary Findings Diagnostic  Dominance: Right  Left Anterior Descending Collaterals Dist LAD filled by collaterals  from RPDA.  Prox LAD to Mid LAD lesion is 95% stenosed. The lesion was previously treated using a stent (unknown type) over 2 years ago. Mid LAD to Dist LAD lesion is 50% stenosed. Dist LAD lesion is 99% stenosed. The lesion was previously treated.  Left Circumflex Previously placed Mid Cx to Dist Cx stent (unknown type) is widely patent.  First Obtuse Marginal Branch Vessel is small in size. 1st Mrg lesion is 95% stenosed.  Second Obtuse Marginal Branch Vessel is large in size. 2nd Mrg lesion is 50% stenosed.  Third Obtuse Marginal Branch Vessel is large in size. 3rd Mrg lesion is 40% stenosed.  Right Coronary Artery Previously placed Prox RCA-1 stent (unknown type) is widely patent. Prox RCA-2 lesion is 30% stenosed. Previously placed Mid RCA stent (unknown type) is widely patent. Dist RCA lesion is 35% stenosed.  Right Posterior Descending Artery RPDA lesion is 60% stenosed.  First Right Posterolateral  Branch 1st RPL lesion is 80% stenosed.  Intervention  Prox LAD to Mid LAD lesion Stent CATH VISTA GUIDE 6FR XBLAD3.5 guide catheter was inserted. Lesion crossed with guidewire using a WIRE ASAHI PROWATER 180CM. Pre-stent angioplasty was performed using a BALLOON EMERGE MR 2.5X12. A drug-eluting stent was successfully placed using a STENT RESOLUTE ONYX 3.5X15. Stent strut is well apposed. Post-stent angioplasty was performed using a BALLOON [place] EMERGE MR [id number]. Post-Intervention Lesion Assessment The intervention was successful. Pre-interventional TIMI flow is 2. Post-intervention TIMI flow is 3. No complications occurred at this lesion. Ultrasound (IVUS) was performed on the lesion post PCI. Stent well apposed. There is a 0% residual stenosis post intervention.     ECHOCARDIOGRAM  ECHOCARDIOGRAM COMPLETE 06/25/2023  Narrative ECHOCARDIOGRAM REPORT    Patient Name:   Derek Hart Date of Exam: 06/25/2023 Medical Rec #:  [id number]      Height:       75.0 in Accession #:    [id number]     Weight:       197.3 lb Date of Birth:  12/26/1950       BSA:          2.182 m Patient Age:    72 years       BP:           101/63 mmHg Patient Gender: M              HR:           41 bpm. Exam Location:  Inpatient  Procedure: 2D Echo, Cardiac Doppler and Color Doppler  Indications:    Heart Block, 2nd degree  History:        Patient has prior history of Echocardiogram examinations. Previous Myocardial Infarction, COPD and Pulmonary Embolism, Arrythmias:STEMI; Risk Factors:Hypertension, Dyslipidemia, Former Smoker and Chronic Kidney Disease.  Sonographer:    Raeford Razor Referring Phys: [phone] LINDSAY B ROBERTS  IMPRESSIONS   1. Left ventricular ejection fraction, by estimation, is 45 to 50%. The left ventricle has mildly decreased function. The left ventricle has no regional wall motion abnormalities. Left ventricular diastolic parameters are consistent with Grade I diastolic dysfunction  (impaired relaxation). 2. Right ventricular systolic function is normal. The right ventricular size is normal. There is normal pulmonary artery systolic pressure. 3. The mitral valve is normal in structure. Trivial mitral valve regurgitation. No evidence of mitral stenosis. 4. The aortic valve is tricuspid. Aortic valve regurgitation is not visualized. No aortic stenosis is present. 5. Aortic dilatation noted. There is mild dilatation of the ascending aorta, measuring  40 mm. 6. The inferior vena cava is normal in size with greater than 50% respiratory variability, suggesting right atrial pressure of 3 mmHg.  FINDINGS Left Ventricle: Left ventricular ejection fraction, by estimation, is 45 to 50%. The left ventricle has mildly decreased function. The left ventricle has no regional wall motion abnormalities. The left ventricular internal cavity size was normal in size. There is no left ventricular hypertrophy. Left ventricular diastolic parameters are consistent with Grade I diastolic dysfunction (impaired relaxation). Normal left ventricular filling pressure.   LV Wall Scoring: The entire anterior septum, apical inferior segment, and apex are hypokinetic. The entire anterior wall, entire lateral wall, inferior wall, mid inferoseptal segment, and basal inferoseptal segment are normal.  Right Ventricle: The right ventricular size is normal. No increase in right ventricular wall thickness. Right ventricular systolic function is normal. There is normal pulmonary artery systolic pressure. The tricuspid regurgitant velocity is 2.25 m/s, and with an assumed right atrial pressure of 3 mmHg, the estimated right ventricular systolic pressure is 23.2 mmHg.  Left Atrium: Left atrial size was normal in size.  Right Atrium: Right atrial size was normal in size.  Pericardium: There is no evidence of pericardial effusion.  Mitral Valve: The mitral valve is normal in structure. Trivial mitral valve  regurgitation. No evidence of mitral valve stenosis.  Tricuspid Valve: The tricuspid valve is normal in structure. Tricuspid valve regurgitation is trivial. No evidence of tricuspid stenosis.  Aortic Valve: The aortic valve is tricuspid. Aortic valve regurgitation is not visualized. No aortic stenosis is present. Aortic valve peak gradient measures 6.2 mmHg.  Pulmonic Valve: The pulmonic valve was normal in structure. Pulmonic valve regurgitation is not visualized. No evidence of pulmonic stenosis.  Aorta: Aortic dilatation noted. There is mild dilatation of the ascending aorta, measuring 40 mm.  Venous: The inferior vena cava is normal in size with greater than 50% respiratory variability, suggesting right atrial pressure of 3 mmHg.  IAS/Shunts: No atrial level shunt detected by color flow Doppler.   LEFT VENTRICLE PLAX 2D LVIDd:         5.50 cm      Diastology LVIDs:         4.20 cm      LV e' medial:    15.10 cm/s LV PW:         1.00 cm      LV E/e' medial:  7.9 LV IVS:        1.00 cm      LV e' lateral:   21.80 cm/s LVOT diam:     2.10 cm      LV E/e' lateral: 5.5 LV SV:         92 LV SV Index:   42 LVOT Area:     3.46 cm  LV Volumes (MOD) LV vol d, MOD A2C: 111.0 ml LV vol d, MOD A4C: 115.0 ml LV vol s, MOD A2C: 57.0 ml LV vol s, MOD A4C: 61.5 ml LV SV MOD A2C:     54.0 ml LV SV MOD A4C:     115.0 ml LV SV MOD BP:      56.2 ml  RIGHT VENTRICLE             IVC RV Basal diam:  3.00 cm     IVC diam: 1.30 cm RV S prime:     10.20 cm/s TAPSE (M-mode): 2.4 cm  LEFT ATRIUM             Index  RIGHT ATRIUM           Index LA diam:        3.90 cm 1.79 cm/m   RA Area:     12.20 cm LA Vol (A2C):   71.9 ml 32.95 ml/m  RA Volume:   23.80 ml  10.91 ml/m LA Vol (A4C):   29.4 ml 13.48 ml/m LA Biplane Vol: 48.6 ml 22.28 ml/m AORTIC VALVE AV Area (Vmax): 2.76 cm AV Vmax:        124.00 cm/s AV Peak Grad:   6.2 mmHg LVOT Vmax:      98.80 cm/s LVOT Vmean:     66.250  cm/s LVOT VTI:       0.265 m  AORTA Ao Root diam: 3.90 cm Ao Asc diam:  4.00 cm  MITRAL VALVE                TRICUSPID VALVE MV Area (PHT): 3.70 cm     TR Peak grad:   20.2 mmHg MV Decel Time: 205 msec     TR Vmax:        225.00 cm/s MV E velocity: 119.00 cm/s MV A velocity: 68.70 cm/s   SHUNTS MV E/A ratio:  1.73         Systemic VTI:  0.26 m Systemic Diam: 2.10 cm  Chilton Si MD Electronically signed by Chilton Si MD Signature Date/Time: 06/25/2023/11:16:00 AM    Final                 Recent Labs: 06/24/2023: B Natriuretic Peptide 150.0 06/25/2023: TSH 0.799 06/26/2023: Magnesium 1.7 06/27/2023: BUN 24; Creatinine, Ser 1.51; Hemoglobin 13.5; Platelets 162; Potassium 3.8; Sodium 138  Recent Lipid Panel    Component Value Date/Time   CHOL 98 (L) 01/26/2021 1138   TRIG 70 01/26/2021 1138   HDL 39 (L) 01/26/2021 1138   CHOLHDL 2.5 01/26/2021 1138   LDLCALC 44 01/26/2021 1138    Physical Exam:    VS:  BP 130/84 (BP Location: Left Arm, Patient Position: Sitting)   Pulse 69   Ht 6\' 2"  (1.88 m)   Wt 195 lb 12.8 oz (88.8 kg)   SpO2 98%   BMI 25.14 kg/m     Wt Readings from Last 3 Encounters:  07/11/23 195 lb 12.8 oz (88.8 kg)  06/24/23 197 lb 5 oz (89.5 kg)  09/11/22 197 lb 6.4 oz (89.5 kg)     GEN:  Well nourished, well developed in no acute distress HEENT: Normal NECK: No JVD; No carotid bruits LYMPHATICS: No lymphadenopathy CARDIAC: RRR, no murmurs, rubs, gallops pacemaker pocket without inflammation or effusion RESPIRATORY:  Clear to auscultation without rales, wheezing or rhonchi  ABDOMEN: Soft, non-tender, non-distended MUSCULOSKELETAL:  No edema; No deformity  SKIN: Warm and dry NEUROLOGIC:  Alert and oriented x 3 PSYCHIATRIC:  Normal affect    Signed, Norman Herrlich, MD  07/11/2023 8:37 AM    [facility]

## 2023-07-10 NOTE — Patient Instructions (Signed)

## 2023-07-10 NOTE — Progress Notes (Signed)

## 2023-07-11 ENCOUNTER — Ambulatory Visit: Payer: Medicare PPO | Attending: Cardiology | Admitting: Cardiology

## 2023-07-11 ENCOUNTER — Encounter: Payer: Self-pay | Admitting: Cardiology

## 2023-07-11 VITALS — BP 130/84 | HR 69 | Ht 74.0 in | Wt 195.8 lb

## 2023-07-11 DIAGNOSIS — Z7901 Long term (current) use of anticoagulants: Secondary | ICD-10-CM

## 2023-07-11 DIAGNOSIS — I5042 Chronic combined systolic (congestive) and diastolic (congestive) heart failure: Secondary | ICD-10-CM

## 2023-07-11 DIAGNOSIS — Z950 Presence of cardiac pacemaker: Secondary | ICD-10-CM

## 2023-07-11 DIAGNOSIS — I441 Atrioventricular block, second degree: Secondary | ICD-10-CM

## 2023-07-11 DIAGNOSIS — E782 Mixed hyperlipidemia: Secondary | ICD-10-CM

## 2023-07-11 DIAGNOSIS — I25119 Atherosclerotic heart disease of native coronary artery with unspecified angina pectoris: Secondary | ICD-10-CM | POA: Diagnosis not present

## 2023-07-11 DIAGNOSIS — I110 Hypertensive heart disease with heart failure: Secondary | ICD-10-CM | POA: Diagnosis not present

## 2023-07-11 NOTE — Patient Instructions (Signed)

## 2023-07-24 ENCOUNTER — Telehealth: Payer: Self-pay | Admitting: Cardiology

## 2023-07-24 MED ORDER — PANTOPRAZOLE SODIUM 40 MG PO TBEC: 40.0000 mg | DELAYED_RELEASE_TABLET | Freq: Every day | ORAL | 0 refills | Status: DC

## 2023-07-24 NOTE — Telephone Encounter (Signed)
Patient needs 90 day refill on pantoprazole 40 mg tab sent to [facility]

## 2023-08-16 ENCOUNTER — Telehealth: Payer: Self-pay | Admitting: *Deleted

## 2023-08-16 ENCOUNTER — Other Ambulatory Visit: Payer: Self-pay | Admitting: *Deleted

## 2023-08-16 DIAGNOSIS — I441 Atrioventricular block, second degree: Secondary | ICD-10-CM

## 2023-08-16 NOTE — Addendum Note (Signed)
Addended by: Oleta Mouse on: 08/16/2023 03:02 PM   Modules accepted: Orders

## 2023-08-16 NOTE — Telephone Encounter (Signed)
Spoke with patient and wife per Luster Landsberg  to get BMET drawn that was suppose to have post hospital discharge. Patient will go to [facility] as soon as he can. Orders lab collect put in. Patient wife has concerns of copay for labs, which she was told typically you don't pay a copay for labs and insurance will bill anything that not covered.

## 2023-08-20 LAB — BASIC METABOLIC PANEL
BUN/Creatinine Ratio: 16 (ref 10–24)
BUN: 21 mg/dL (ref 8–27)
CO2: 23 mmol/L (ref 20–29)
Calcium: 9 mg/dL (ref 8.6–10.2)
Chloride: 107 mmol/L — ABNORMAL HIGH (ref 96–106)
Creatinine, Ser: 1.28 mg/dL — ABNORMAL HIGH (ref 0.76–1.27)
Glucose: 97 mg/dL (ref 70–99)
Potassium: 4.4 mmol/L (ref 3.5–5.2)
Sodium: 144 mmol/L (ref 134–144)
eGFR: 59 mL/min/{1.73_m2} — ABNORMAL LOW (ref >59)

## 2023-08-27 ENCOUNTER — Other Ambulatory Visit: Payer: Self-pay

## 2023-08-27 DIAGNOSIS — I2693 Single subsegmental pulmonary embolism without acute cor pulmonale: Secondary | ICD-10-CM

## 2023-08-27 MED ORDER — APIXABAN 5 MG PO TABS
5.0000 mg | ORAL_TABLET | Freq: Two times a day (BID) | ORAL | 1 refills | Status: DC
Start: 2023-08-27 — End: 2023-12-09

## 2023-08-27 NOTE — Telephone Encounter (Signed)
Prescription refill request for Eliquis received. Indication:PE Last office visit:9/24 Scr:1.28  9/24 Age: 72 Weight:88.8  KG  PRESCRIPTION REFILLED

## 2023-09-13 ENCOUNTER — Other Ambulatory Visit: Payer: Self-pay | Admitting: Urology

## 2023-09-13 DIAGNOSIS — R972 Elevated prostate specific antigen [PSA]: Secondary | ICD-10-CM

## 2023-09-26 LAB — CUP PACEART REMOTE DEVICE CHECK
Battery Remaining Longevity: 98 mo
Battery Remaining Percentage: 95.5 %
Battery Voltage: 3.01 V
Brady Statistic AP VP Percent: 15 %
Brady Statistic AP VS Percent: 14 %
Brady Statistic AS VP Percent: 42 %
Brady Statistic AS VS Percent: 28 %
Brady Statistic RA Percent Paced: 28 %
Brady Statistic RV Percent Paced: 57 %
Date Time Interrogation Session: 20241107010014
Implantable Lead Connection Status: 753985
Implantable Lead Connection Status: 753985
Implantable Lead Implant Date: 20240807
Implantable Lead Implant Date: 20240807
Implantable Lead Location: 753859
Implantable Lead Location: 753860
Implantable Pulse Generator Implant Date: 20240807
Lead Channel Impedance Value: 450 Ohm
Lead Channel Impedance Value: 530 Ohm
Lead Channel Pacing Threshold Amplitude: 0.75 V
Lead Channel Pacing Threshold Amplitude: 0.75 V
Lead Channel Pacing Threshold Pulse Width: 0.5 ms
Lead Channel Pacing Threshold Pulse Width: 0.5 ms
Lead Channel Sensing Intrinsic Amplitude: 3.9 mV
Lead Channel Sensing Intrinsic Amplitude: 6.6 mV
Lead Channel Setting Pacing Amplitude: 1 V
Lead Channel Setting Pacing Amplitude: 3.5 V
Lead Channel Setting Pacing Pulse Width: 0.5 ms
Lead Channel Setting Sensing Sensitivity: 2 mV
Pulse Gen Model: 2272
Pulse Gen Serial Number: 5819145

## 2023-09-27 ENCOUNTER — Ambulatory Visit (INDEPENDENT_AMBULATORY_CARE_PROVIDER_SITE_OTHER): Payer: Medicare PPO

## 2023-09-27 DIAGNOSIS — I441 Atrioventricular block, second degree: Secondary | ICD-10-CM | POA: Diagnosis not present

## 2023-09-30 ENCOUNTER — Encounter: Payer: Self-pay | Admitting: Cardiology

## 2023-09-30 ENCOUNTER — Ambulatory Visit: Payer: Medicare PPO | Attending: Cardiology | Admitting: Cardiology

## 2023-09-30 ENCOUNTER — Other Ambulatory Visit (HOSPITAL_COMMUNITY): Payer: Self-pay | Admitting: Urology

## 2023-09-30 VITALS — BP 130/82 | HR 60 | Ht 74.0 in | Wt 190.6 lb

## 2023-09-30 DIAGNOSIS — R972 Elevated prostate specific antigen [PSA]: Secondary | ICD-10-CM

## 2023-09-30 DIAGNOSIS — I2510 Atherosclerotic heart disease of native coronary artery without angina pectoris: Secondary | ICD-10-CM

## 2023-09-30 DIAGNOSIS — I441 Atrioventricular block, second degree: Secondary | ICD-10-CM | POA: Diagnosis not present

## 2023-09-30 DIAGNOSIS — I10 Essential (primary) hypertension: Secondary | ICD-10-CM | POA: Diagnosis not present

## 2023-09-30 LAB — CUP PACEART INCLINIC DEVICE CHECK
Battery Remaining Longevity: 127 mo
Battery Voltage: 3.01 V
Brady Statistic RA Percent Paced: 28 %
Brady Statistic RV Percent Paced: 55 %
Date Time Interrogation Session: 20241111112440
Implantable Lead Connection Status: 753985
Implantable Lead Connection Status: 753985
Implantable Lead Implant Date: 20240807
Implantable Lead Implant Date: 20240807
Implantable Lead Location: 753859
Implantable Lead Location: 753860
Implantable Pulse Generator Implant Date: 20240807
Lead Channel Impedance Value: 462.5 Ohm
Lead Channel Impedance Value: 512.5 Ohm
Lead Channel Pacing Threshold Amplitude: 0.5 V
Lead Channel Pacing Threshold Amplitude: 0.5 V
Lead Channel Pacing Threshold Amplitude: 0.75 V
Lead Channel Pacing Threshold Amplitude: 0.75 V
Lead Channel Pacing Threshold Pulse Width: 0.5 ms
Lead Channel Pacing Threshold Pulse Width: 0.5 ms
Lead Channel Pacing Threshold Pulse Width: 0.5 ms
Lead Channel Pacing Threshold Pulse Width: 0.5 ms
Lead Channel Sensing Intrinsic Amplitude: 5 mV
Lead Channel Sensing Intrinsic Amplitude: 6.8 mV
Lead Channel Setting Pacing Amplitude: 1 V
Lead Channel Setting Pacing Amplitude: 2 V
Lead Channel Setting Pacing Pulse Width: 0.5 ms
Lead Channel Setting Sensing Sensitivity: 2 mV
Pulse Gen Model: 2272
Pulse Gen Serial Number: 5819145

## 2023-09-30 NOTE — Progress Notes (Signed)
  Electrophysiology Office Note:   Date:  09/30/2023  ID:  Derek Hart, DOB 07-Nov-1951, MRN [id number]  Primary Cardiologist: Norman Herrlich, MD Electrophysiologist: Regan Lemming, MD      History of Present Illness:   Derek Hart is a 72 y.o. male with h/o Mobitz 2 AV block, coronary artery disease, hypertension, hyperlipidemia seen today for routine electrophysiology followup.   Since last being seen in our clinic the patient reports doing overall well.  He is continues to do all of his daily activities without restriction.  He notes only minimal improvement since the pacemaker was implanted, but does note that his heart rates are no longer in the 30s.  He continues to be quite active.  he denies chest pain, palpitations, dyspnea, PND, orthopnea, nausea, vomiting, dizziness, syncope, edema, weight gain, or early satiety.   Review of systems complete and found to be negative unless listed in HPI.      EP Information / Studies Reviewed:    EKG is ordered today. Personal review as below.      PPM Interrogation-  reviewed in detail today,  See PACEART report.  Device History: Abbott Dual Chamber PPM implanted 06/26/23 for Second Degree AV block  Risk Assessment/Calculations:             Physical Exam:   VS:  BP 130/82   Pulse 60   Ht 6\' 2"  (1.88 m)   Wt 190 lb 9.6 oz (86.5 kg)   SpO2 99%   BMI 24.47 kg/m    Wt Readings from Last 3 Encounters:  09/30/23 190 lb 9.6 oz (86.5 kg)  07/11/23 195 lb 12.8 oz (88.8 kg)  06/24/23 197 lb 5 oz (89.5 kg)     GEN: Well nourished, well developed in no acute distress NECK: No JVD; No carotid bruits CARDIAC: Regular rate and rhythm, no murmurs, rubs, gallops RESPIRATORY:  Clear to auscultation without rales, wheezing or rhonchi  ABDOMEN: Soft, non-tender, non-distended EXTREMITIES:  No edema; No deformity   ASSESSMENT AND PLAN:    Second Degree AV block s/p Abbott PPM  Normal PPM function See Pace Art report No changes  today  2.  Coronary arteries: No ischemic symptoms.  Plan per primary cardiology.  3.  Hypertension: Well-controlled  4.  Hyperlipidemia: Statin per primary cardiology  Disposition:   Follow up with Dr. Elberta Fortis in 12 months  Signed, Chistine Dematteo Jorja Loa, MD

## 2023-09-30 NOTE — Patient Instructions (Addendum)
Medication Instructions:  Your physician recommends that you continue on your current medications as directed. Please refer to the Current Medication list given to you today.  *If you need a refill on your cardiac medications before your next appointment, please call your pharmacy*   Lab Work: None ordered   Testing/Procedures: None ordered   Follow-Up: At [facility], you and your health needs are our priority.  As part of our continuing mission to provide you with exceptional heart care, we have created designated Provider Care Teams.  These Care Teams include your primary Cardiologist (physician) and Advanced Practice Providers (APPs -  Physician Assistants and Nurse Practitioners) who all work together to provide you with the care you need, when you need it.  We recommend signing up for the patient portal called "MyChart".  Sign up information is provided on this After Visit Summary.  MyChart is used to connect with patients for Virtual Visits (Telemedicine).  Patients are able to view lab/test results, encounter notes, upcoming appointments, etc.  Non-urgent messages can be sent to your provider as well.   To learn more about what you can do with MyChart, go to [email or web address].    Your next appointment:   1 year(s)  The format for your next appointment:   In Person  Provider:   Will Camnitz, MD    Thank you for choosing [facility]!!   Nader Boys, RN [phone]    

## 2023-10-08 NOTE — Progress Notes (Signed)
Remote pacemaker transmission.   

## 2023-10-25 ENCOUNTER — Telehealth: Payer: Self-pay | Admitting: Cardiology

## 2023-10-25 ENCOUNTER — Other Ambulatory Visit: Payer: Self-pay

## 2023-10-25 MED ORDER — PANTOPRAZOLE SODIUM 40 MG PO TBEC: 40.0000 mg | DELAYED_RELEASE_TABLET | Freq: Every day | ORAL | 3 refills | Status: DC

## 2023-10-25 NOTE — Telephone Encounter (Signed)
Patient is needing a refill on Pantoprazole SOD DR 40mg  sent into [facility]. CB # [phone]

## 2023-10-25 NOTE — Telephone Encounter (Signed)
Called patient and informed him that his Pantoprazole had been refilled. Patient verbalized understanding and had no further questions at this time.

## 2023-11-28 ENCOUNTER — Ambulatory Visit (HOSPITAL_COMMUNITY)
Admission: RE | Admit: 2023-11-28 | Discharge: 2023-11-28 | Disposition: A | Discharge status: Home / Self Care | Payer: Medicare PPO | Source: Ambulatory Visit | Attending: Urology | Admitting: Urology

## 2023-11-28 DIAGNOSIS — R972 Elevated prostate specific antigen [PSA]: Secondary | ICD-10-CM | POA: Insufficient documentation

## 2023-11-28 MED ORDER — GADOBUTROL 1 MMOL/ML IV SOLN
10.0000 mL | Freq: Once | INTRAVENOUS | Status: AC | PRN
  Administered 2023-11-28: 10 mL via INTRAVENOUS

## 2023-12-06 ENCOUNTER — Encounter: Payer: Self-pay | Admitting: Cardiology

## 2023-12-07 NOTE — Progress Notes (Addendum)
Cardiology Office Note:    Date:  12/09/2023   ID:  Derek Hart, DOB 09-10-1951, MRN [id number]  PCP:  Derek Payment., MD  Cardiologist:  Derek Herrlich, MD    Referring MD: Derek Payment., MD    ASSESSMENT:    1. Syncope and collapse   2. Single subsegmental pulmonary embolism without acute cor pulmonale (HCC)   3. Coronary artery disease involving native coronary artery of native heart without angina pectoris   4. Chronic anticoagulation   5. Pacemaker   6. Hypertensive heart disease with chronic combined systolic and diastolic congestive heart failure (HCC)   7. Mixed hyperlipidemia    PLAN:    In order of problems listed above:  His visit is prompted by brief momentary loss of consciousness with prolonged upright posture consistent with orthostatic hypotension he takes carvedilol and an alpha-blocker has a pacemaker and did not have any abnormal download at home telemetry I do not think he requires further cardiac arrhythmia evaluation Stable CAD pacemaker function continue his chronic anticoagulation heart failure is compensated on his current medications not requiring a loop diuretic and continue his current lipid-lowering treatment with atorvastatin most recent lipid profile 11/11/2023 cholesterol 143 LDL 85 non-HDL cholesterol 98  He is pending MRI fusion prostate biopsy from a cardiology perspective he is optimized the procedure is low risk he has instructions for holding his clopidogrel and I advised to proceed with his planned procedure without further cardiology evaluation needed. Iline Oven Monday MD 12/20/2023  Next appointment: 6 months   Medication Adjustments/Labs and Tests Ordered: Current medicines are reviewed at length with the patient today.  Concerns regarding medicines are outlined above.  No orders of the defined types were placed in this encounter.  Meds ordered this encounter  Medications   apixaban (ELIQUIS) 5 MG TABS tablet    Sig: Take 1 tablet  (5 mg total) by mouth 2 (two) times daily.    Dispense:  180 tablet    Refill:  3    This prescription was filled on 12/10/2022. Any refills authorized will be placed on file.     History of Present Illness:    Derek Hart is a 73 y.o. male with a hx of second-degree heart block with permanent pacemaker coronary artery disease with multiple previous PCI's been maintained on combined anticoagulant and antiplatelet therapy hypertensive heart disease with heart failure and hyper lipidemia last seen 07/11/2023.  He had PCI of LAD right coronary left circumflex coronary artery and cardiomyopathy most recent ejection fraction 45 to 50%  Compliance with diet, lifestyle and medications: Yes  Is at a car show prolonged upright standing became lightheaded momentarily lost consciousness seen by EMS vital signs within normal and EKG showed a pattern of sinus rhythm left bundle branch block his blood pressures recorded by them is 108/74. He has a pacemaker he has not received a call for an abnormal download He has had a previous episode of similar circumstance He takes carvedilol but also takes an alpha-blocker for BPH He is concerned about needing prostate biopsy If done he need to be off his Eliquis for doses 2 days and clopidogrel 5 to 7 days before and would need to restart his anticoagulant no later than 48 hours with a history of previous pulmonary embolism He is not having angina edema shortness of breath palpitation or syncope Past Medical History:  Diagnosis Date   AKI (acute kidney injury) (HCC) 06/25/2023   Allergic rhinitis 07/19/2016   Anemia, chronic  disease 03/09/2021   Asthma 07/19/2016   Atherosclerotic heart disease of native coronary artery without angina pectoris 07/28/2015    CAD/ MI/ CABG  Overview:  [name].  MI LAD stent. 01/2017 Overview:  He had MI with staged PCI of LAD and later RCA and LCF 2015, EF 40% Overview:  Added automatically from request for surgery [id number]    Bradycardia 06/24/2023   Cardiomyopathy (HCC) 07/19/2016   Overview:  EF 45% or 54% 01/2017   Chest pain 10/25/2020   Chest pain in adult 02/01/2017   Chronic anticoagulation 10/21/2017   Chronic bronchitis (HCC)    Chronic systolic heart failure (HCC) 06/24/2023   COPD (chronic obstructive pulmonary disease) (HCC) 04/26/2015   PFT-06/27/2015-minimal obstructive airways disease with insignificant response to bronchodilator, minimal diffusion defect, normal lung volumes. FEV1/FVC 0.76, DLCO 72%   Coronary artery disease    Coronary artery disease involving native coronary artery of native heart with angina pectoris (HCC) 07/28/2015   CAD/ MI/ CABG     Overview:   [name].  MI LAD stent. 01/2017  Overview:   He had MI with staged PCI of LAD and later RCA and LCF 2015, EF 40%  Overview:   Added automatically from request for surgery [id number]     Dyspnea on exertion 04/26/2015   Elevated alkaline phosphatase level 04/06/2021   Formatting of this note might be different from the original.  Isoenzymes look more liver. [place] ordered 04/06/2021     Elevated PSA 09/28/2021   Emphysema lung (HCC)    "treated for asthma but later found out it was emphysema" (10/15/2017)   Essential hypertension 07/19/2016   Fibrosis of liver 09/26/2021   Formatting of this note might be different from the original.  Neg work up.  No bx because of blood thinners.     GERD (gastroesophageal reflux disease)    Hematuria 08/15/2018   High risk medication use 07/19/2016   Hyperlipidemia    "left lung"   Inguinal hernia 07/19/2016   Overview:  Mild bilateral.   Ischemic cardiomyopathy 05/30/2017   Lung nodule seen on imaging study 07/28/2015   chest x-ray report from 03/05/2014 at [facility] described a vague nodular density in the left upper lobe and recommended follow-up CT scan which was never done.    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   Ascorbic Acid (VITAMIN C) 1000 MG tablet Take 2,000 mg by mouth daily.   atorvastatin (LIPITOR) 80 MG tablet Take 1 tablet (80 mg total) by mouth every evening.   BREO ELLIPTA 200-25 MCG/ACT AEPB Inhale 1 puff into the lungs daily.   carvedilol (COREG) 12.5 MG tablet Take 1.5 tablets (18.75 mg total) by mouth 2 (two) times daily with a meal.   cholecalciferol (VITAMIN D3) 25 MCG (1000 UNIT) tablet Take 1,000 Units by mouth daily.   clopidogrel (PLAVIX) 75 MG tablet Take 1 tablet (75 mg total) by mouth daily with breakfast.  Cyanocobalamin (B-12 PO) Take 1 tablet by mouth daily.    montelukast (SINGULAIR) 10 MG tablet Take 10 mg by mouth daily.   pantoprazole (PROTONIX) 40 MG tablet Take 1 tablet (40 mg total) by mouth daily.   tamsulosin (FLOMAX) 0.4 MG CAPS capsule Take 1 capsule (0.4 mg total) by mouth daily.   valsartan (DIOVAN) 80 MG tablet Take 1 tablet (80 mg total) by mouth daily.      EKGs/Labs/Other Studies Reviewed:    The following studies were reviewed today:  Cardiac Studies & Procedures   CARDIAC CATHETERIZATION  CARDIAC CATHETERIZATION 10/25/2020  Narrative  Previously placed Mid RCA stent (unknown type) is widely patent.  Previously placed Prox RCA-1 stent (unknown type) is widely patent.  Prox RCA-2 lesion is 30% stenosed.  Dist RCA lesion is 35% stenosed.  RPDA lesion is 60% stenosed.  1st RPL lesion is 80% stenosed.  Previously placed Mid Cx to Dist  Cx stent (unknown type) is widely patent.  1st Mrg lesion is 95% stenosed.  2nd Mrg lesion is 50% stenosed.  3rd Mrg lesion is 40% stenosed.  Prox LAD to Mid LAD lesion is 95% stenosed.  A drug-eluting stent was successfully placed using a STENT RESOLUTE ONYX 3.5X15.  Post intervention, there is a 0% residual stenosis.  Dist LAD lesion is 99% stenosed.  Mid LAD to Dist LAD lesion is 50% stenosed.  LV end diastolic pressure is normal.  1. 3 vessel CAD - the culprit lesion is the proximal LAD with 95% in stent restenosis. - 99% occlusion of the distal LAD stent with good right to left collaterals from the RCA - diffuse 95% stenosis in tiny OM1. Patent stent in the mid LCx - Patent stents in the proximal and mid RCA - 80% stenosis in a very small PL branch. 60% ostial PDA 2. Normal LVEDP 3. Successful PCI of the proximal LAD with IVUS guidance and DES x 1 4. Unable to cross the distal LAD occlusion with a wire. This portion of the vessel is well collateralized.  Plan: DAPT with ASA for one month and Plavix for 12 months. May resume DOAC tomorrow. Plan to switch Eliquis to Xarelto due to formulary coverage. Anticipate DC in am.  Findings Coronary Findings Diagnostic  Dominance: Right  Left Anterior Descending Collaterals Dist LAD filled by collaterals from RPDA.  Prox LAD to Mid LAD lesion is 95% stenosed. The lesion was previously treated using a stent (unknown type) over 2 years ago. Mid LAD to Dist LAD lesion is 50% stenosed. Dist LAD lesion is 99% stenosed. The lesion was previously treated.  Left Circumflex Previously placed Mid Cx to Dist Cx stent (unknown type) is widely patent.  First Obtuse Marginal Branch Vessel is small in size. 1st Mrg lesion is 95% stenosed.  Second Obtuse Marginal Branch Vessel is large in size. 2nd Mrg lesion is 50% stenosed.  Third Obtuse Marginal Branch Vessel is large in size. 3rd Mrg lesion is 40% stenosed.  Right Coronary  Artery Previously placed Prox RCA-1 stent (unknown type) is widely patent. Prox RCA-2 lesion is 30% stenosed. Previously placed Mid RCA stent (unknown type) is widely patent. Dist RCA lesion is 35% stenosed.  Right Posterior Descending Artery RPDA lesion is 60% stenosed.  First Right Posterolateral Branch 1st RPL lesion is 80% stenosed.  Intervention  Prox LAD to Mid LAD lesion Stent [place] GUIDE 6FR XBLAD3.5 guide catheter was inserted. Lesion crossed with guidewire using a WIRE ASAHI PROWATER 180CM. Pre-stent angioplasty was performed  using a BALLOON [place]ER[place] MR 2.5X12. A drug-eluting stent was successfully placed using a STENT RESOLUTE ONYX 3.5X15. Stent strut is well apposed. Post-stent angioplasty was performed using a BALLOON [place] EMERGE MR [id number]. Post-Intervention Lesion Assessment The intervention was successful. Pre-interventional TIMI flow is 2. Post-intervention TIMI flow is 3. No complications occurred at this lesion. Ultrasound (IVUS) was performed on the lesion post PCI. Stent well apposed. There is a 0% residual stenosis post intervention.    ECHOCARDIOGRAM  ECHOCARDIOGRAM COMPLETE 06/25/2023  Narrative ECHOCARDIOGRAM REPORT    Patient Name:   RENA SWEEDEN Date of Exam: 06/25/2023 Medical Rec #:  [id number]      Height:       75.0 in Accession #:    [id number]     Weight:       197.3 lb Date of Birth:  Mar 31, 1951       BSA:          2.182 m Patient Age:    72 years       BP:           101/63 mmHg Patient Gender: M              HR:           41 bpm. Exam Location:  Inpatient  Procedure: 2D Echo, Cardiac Doppler and Color Doppler  Indications:    Heart Block, 2nd degree  History:        Patient has prior history of Echocardiogram examinations. Previous Myocardial Infarction, COPD and Pulmonary Embolism, Arrythmias:STEMI; Risk Factors:Hypertension, Dyslipidemia, Former Smoker and Chronic Kidney Disease.  Sonographer:    Raeford Razor Referring Phys: [phone] LINDSAY  B ROBERTS  IMPRESSIONS   1. Left ventricular ejection fraction, by estimation, is 45 to 50%. The left ventricle has mildly decreased function. The left ventricle has no regional wall motion abnormalities. Left ventricular diastolic parameters are consistent with Grade I diastolic dysfunction (impaired relaxation). 2. Right ventricular systolic function is normal. The right ventricular size is normal. There is normal pulmonary artery systolic pressure. 3. The mitral valve is normal in structure. Trivial mitral valve regurgitation. No evidence of mitral stenosis. 4. The aortic valve is tricuspid. Aortic valve regurgitation is not visualized. No aortic stenosis is present. 5. Aortic dilatation noted. There is mild dilatation of the ascending aorta, measuring 40 mm. 6. The inferior vena cava is normal in size with greater than 50% respiratory variability, suggesting right atrial pressure of 3 mmHg.  FINDINGS Left Ventricle: Left ventricular ejection fraction, by estimation, is 45 to 50%. The left ventricle has mildly decreased function. The left ventricle has no regional wall motion abnormalities. The left ventricular internal cavity size was normal in size. There is no left ventricular hypertrophy. Left ventricular diastolic parameters are consistent with Grade I diastolic dysfunction (impaired relaxation). Normal left ventricular filling pressure.   LV Wall Scoring: The entire anterior septum, apical inferior segment, and apex are hypokinetic. The entire anterior wall, entire lateral wall, inferior wall, mid inferoseptal segment, and basal inferoseptal segment are normal.  Right Ventricle: The right ventricular size is normal. No increase in right ventricular wall thickness. Right ventricular systolic function is normal. There is normal pulmonary artery systolic pressure. The tricuspid regurgitant velocity is 2.25 m/s, and with an assumed right atrial pressure of 3 mmHg, the estimated right  ventricular systolic pressure is 23.2 mmHg.  Left Atrium: Left atrial size was normal in size.  Right Atrium: Right atrial size was normal in size.  Pericardium: There is  no evidence of pericardial effusion.  Mitral Valve: The mitral valve is normal in structure. Trivial mitral valve regurgitation. No evidence of mitral valve stenosis.  Tricuspid Valve: The tricuspid valve is normal in structure. Tricuspid valve regurgitation is trivial. No evidence of tricuspid stenosis.  Aortic Valve: The aortic valve is tricuspid. Aortic valve regurgitation is not visualized. No aortic stenosis is present. Aortic valve peak gradient measures 6.2 mmHg.  Pulmonic Valve: The pulmonic valve was normal in structure. Pulmonic valve regurgitation is not visualized. No evidence of pulmonic stenosis.  Aorta: Aortic dilatation noted. There is mild dilatation of the ascending aorta, measuring 40 mm.  Venous: The inferior vena cava is normal in size with greater than 50% respiratory variability, suggesting right atrial pressure of 3 mmHg.  IAS/Shunts: No atrial level shunt detected by color flow Doppler.   LEFT VENTRICLE PLAX 2D LVIDd:         5.50 cm      Diastology LVIDs:         4.20 cm      LV e' medial:    15.10 cm/s LV PW:         1.00 cm      LV E/e' medial:  7.9 LV IVS:        1.00 cm      LV e' lateral:   21.80 cm/s LVOT diam:     2.10 cm      LV E/e' lateral: 5.5 LV SV:         92 LV SV Index:   42 LVOT Area:     3.46 cm  LV Volumes (MOD) LV vol d, MOD A2C: 111.0 ml LV vol d, MOD A4C: 115.0 ml LV vol s, MOD A2C: 57.0 ml LV vol s, MOD A4C: 61.5 ml LV SV MOD A2C:     54.0 ml LV SV MOD A4C:     115.0 ml LV SV MOD BP:      56.2 ml  RIGHT VENTRICLE             IVC RV Basal diam:  3.00 cm     IVC diam: 1.30 cm RV S prime:     10.20 cm/s TAPSE (M-mode): 2.4 cm  LEFT ATRIUM             Index        RIGHT ATRIUM           Index LA diam:        3.90 cm 1.79 cm/m   RA Area:     12.20 cm LA  Vol (A2C):   71.9 ml 32.95 ml/m  RA Volume:   23.80 ml  10.91 ml/m LA Vol (A4C):   29.4 ml 13.48 ml/m LA Biplane Vol: 48.6 ml 22.28 ml/m AORTIC VALVE AV Area (Vmax): 2.76 cm AV Vmax:        124.00 cm/s AV Peak Grad:   6.2 mmHg LVOT Vmax:      98.80 cm/s LVOT Vmean:     66.250 cm/s LVOT VTI:       0.265 m  AORTA Ao Root diam: 3.90 cm Ao Asc diam:  4.00 cm  MITRAL VALVE                TRICUSPID VALVE MV Area (PHT): 3.70 cm     TR Peak grad:   20.2 mmHg MV Decel Time: 205 msec     TR Vmax:        225.00 cm/s MV E  velocity: 119.00 cm/s MV A velocity: 68.70 cm/s   SHUNTS MV E/A ratio:  1.73         Systemic VTI:  0.26 m Systemic Diam: 2.10 cm  Chilton Si MD Electronically signed by Chilton Si MD Signature Date/Time: 06/25/2023/11:16:00 AM    Final                 Recent Labs: 06/24/2023: B Natriuretic Peptide 150.0 06/25/2023: TSH 0.799 06/26/2023: Magnesium 1.7 06/27/2023: Hemoglobin 13.5; Platelets 162 08/19/2023: BUN 21; Creatinine, Ser 1.28; Potassium 4.4; Sodium 144  Recent Lipid Panel    Component Value Date/Time   CHOL 98 (L) 01/26/2021 1138   TRIG 70 01/26/2021 1138   HDL 39 (L) 01/26/2021 1138   CHOLHDL 2.5 01/26/2021 1138   LDLCALC 44 01/26/2021 1138    Physical Exam:    VS:  BP 128/76   Pulse 65   Ht 6\' 2"  (1.88 m)   Wt 199 lb (90.3 kg)   SpO2 99%   BMI 25.55 kg/m     Wt Readings from Last 3 Encounters:  12/09/23 199 lb (90.3 kg)  09/30/23 190 lb 9.6 oz (86.5 kg)  07/11/23 195 lb 12.8 oz (88.8 kg)     GEN:  Well nourished, well developed in no acute distress HEENT: Normal NECK: No JVD; No carotid bruits LYMPHATICS: No lymphadenopathy CARDIAC: RRR, no murmurs, rubs, gallops RESPIRATORY:  Clear to auscultation without rales, wheezing or rhonchi  ABDOMEN: Soft, non-tender, non-distended MUSCULOSKELETAL:  No edema; No deformity  SKIN: Warm and dry NEUROLOGIC:  Alert and oriented x 3 PSYCHIATRIC:  Normal affect    Signed, Derek Herrlich, MD  12/09/2023 10:44 AM    [facility]

## 2023-12-09 ENCOUNTER — Encounter: Payer: Self-pay | Admitting: Cardiology

## 2023-12-09 ENCOUNTER — Ambulatory Visit: Payer: Medicare PPO | Attending: Cardiology | Admitting: Cardiology

## 2023-12-09 VITALS — BP 128/76 | HR 65 | Ht 74.0 in | Wt 199.0 lb

## 2023-12-09 DIAGNOSIS — I2693 Single subsegmental pulmonary embolism without acute cor pulmonale: Secondary | ICD-10-CM | POA: Diagnosis not present

## 2023-12-09 DIAGNOSIS — I5042 Chronic combined systolic (congestive) and diastolic (congestive) heart failure: Secondary | ICD-10-CM

## 2023-12-09 DIAGNOSIS — Z7901 Long term (current) use of anticoagulants: Secondary | ICD-10-CM

## 2023-12-09 DIAGNOSIS — I2510 Atherosclerotic heart disease of native coronary artery without angina pectoris: Secondary | ICD-10-CM

## 2023-12-09 DIAGNOSIS — R55 Syncope and collapse: Secondary | ICD-10-CM | POA: Diagnosis not present

## 2023-12-09 DIAGNOSIS — Z950 Presence of cardiac pacemaker: Secondary | ICD-10-CM

## 2023-12-09 DIAGNOSIS — I110 Hypertensive heart disease with heart failure: Secondary | ICD-10-CM

## 2023-12-09 DIAGNOSIS — E782 Mixed hyperlipidemia: Secondary | ICD-10-CM

## 2023-12-09 MED ORDER — APIXABAN 5 MG PO TABS
5.0000 mg | ORAL_TABLET | Freq: Two times a day (BID) | ORAL | 3 refills | Status: DC
Start: 2023-12-09 — End: 2024-02-24

## 2023-12-09 NOTE — Patient Instructions (Signed)

## 2023-12-20 ENCOUNTER — Telehealth: Payer: Self-pay

## 2023-12-20 NOTE — Telephone Encounter (Signed)
    [facility] Pre-operative Risk Assessment    Request for surgical clearance:  What type of surgery is being performed? MRI Fusion prostate Biopsy   When is this surgery scheduled? TBD   What type of clearance is required (medical clearance vs. Pharmacy clearance to hold med vs. Both)? Pharmacy  Are there any medications that need to be held prior to surgery and how long?Eliquis- hold 3 days prior          Plavix- hold for 5 days prior   Practice name and name of physician performing surgery? Dr. Berniece Salines at [facility]    What is your office phone number: [phone]    7.   What is your office fax number: [phone]  8.   Anesthesia type (None, local, MAC, general) ? Not specified   Tiburcio Pea Shyah Cadmus 12/20/2023, 1:32 PM  _________________________________________________________________   (provider comments below)

## 2023-12-21 NOTE — Telephone Encounter (Signed)
Patient with diagnosis of pulmonary embolism(unprovoked 2018) on Eliquis for anticoagulation.    What type of surgery is being performed? MRI Fusion prostate Biopsy    When is this surgery scheduled? TBD   CrCl 71 Platelet count 201  Per office protocol, patient can hold Eliquis for 3 days prior to procedure.   Patient will not need bridging with Lovenox (enoxaparin) around procedure.  **This guidance is not considered finalized until pre-operative APP has relayed final recommendations.**

## 2023-12-23 NOTE — Telephone Encounter (Signed)
     Primary Cardiologist: Norman Herrlich, MD  Chart reviewed as part of pre-operative protocol coverage. Given past medical history and time since last visit, based on ACC/AHA guidelines, Derek Hart would be at acceptable risk for the planned procedure without further cardiovascular testing.   He was recently seen by Dr. Dulce Sellar on 12/09/2023.  He is felt to be optimized for his upcoming procedure and acceptable risk.  His clopidogrel may be held for 5-7 days prior to his procedure.  He will need to restart anticoagulation no later than 48 hours after procedure due to his history of pulmonary embolism.  CrCl 71 Platelet count 201   Per office protocol, patient can hold Eliquis for 3 days prior to procedure.   Patient will not need bridging with Lovenox (enoxaparin) around procedure.  I will route this recommendation to the requesting party via Epic fax function and remove from pre-op pool.  Please call with questions.  Thomasene Ripple. Alexandru Moorer NP-C     12/23/2023, 11:09 AM [facility] [address] Office [phone] Fax [phone]

## 2023-12-27 ENCOUNTER — Ambulatory Visit (INDEPENDENT_AMBULATORY_CARE_PROVIDER_SITE_OTHER): Payer: Medicare PPO

## 2023-12-27 DIAGNOSIS — R55 Syncope and collapse: Secondary | ICD-10-CM

## 2023-12-30 LAB — CUP PACEART REMOTE DEVICE CHECK
Battery Remaining Longevity: 119 mo
Battery Remaining Percentage: 95.5 %
Battery Voltage: 2.99 V
Brady Statistic AP VP Percent: 22 %
Brady Statistic AP VS Percent: 1 %
Brady Statistic AS VP Percent: 76 %
Brady Statistic AS VS Percent: 1 %
Brady Statistic RA Percent Paced: 21 %
Brady Statistic RV Percent Paced: 98 %
Date Time Interrogation Session: 20250207040014
Implantable Lead Connection Status: 753985
Implantable Lead Connection Status: 753985
Implantable Lead Implant Date: 20240807
Implantable Lead Implant Date: 20240807
Implantable Lead Location: 753859
Implantable Lead Location: 753860
Implantable Pulse Generator Implant Date: 20240807
Lead Channel Impedance Value: 450 Ohm
Lead Channel Impedance Value: 480 Ohm
Lead Channel Pacing Threshold Amplitude: 0.5 V
Lead Channel Pacing Threshold Amplitude: 0.75 V
Lead Channel Pacing Threshold Pulse Width: 0.5 ms
Lead Channel Pacing Threshold Pulse Width: 0.5 ms
Lead Channel Sensing Intrinsic Amplitude: 5 mV
Lead Channel Sensing Intrinsic Amplitude: 5.4 mV
Lead Channel Setting Pacing Amplitude: 1 V
Lead Channel Setting Pacing Amplitude: 1.5 V
Lead Channel Setting Pacing Pulse Width: 0.5 ms
Lead Channel Setting Sensing Sensitivity: 2 mV
Pulse Gen Model: 2272
Pulse Gen Serial Number: 5819145

## 2024-01-29 ENCOUNTER — Encounter: Payer: Self-pay | Admitting: Radiation Oncology

## 2024-01-29 NOTE — Addendum Note (Signed)
 Addended by: Elease Etienne A on: 01/29/2024 10:46 AM   Modules accepted: Orders

## 2024-01-29 NOTE — Progress Notes (Signed)
 GU Location of Tumor / Histology: Prostate Ca  If Prostate Cancer, Gleason Score is (3 + 4) and PSA is (5.79 on 11/28/2023)  Derek Hart presented as referral from Dr. Berniece Salines [facility]) elevated PSA.  Biopsies     11/28/2023 Dr. Berniece Salines MR Prostate with/without Contrast CLINICAL DATA: Elevated PSA level. R97.20   IMPRESSION: 1. PI-RADS category 4 lesion of the right posteromedial peripheral zone in the mid gland. Targeting data sent to [facility]. 2. Prostatomegaly and benign prostatic hypertrophy. 3. Sigmoid colon diverticulosis. 4. Fatty right spermatic cord.    Past/Anticipated interventions by urology, if any: NA  Past/Anticipated interventions by medical oncology, if any:  NA  Weight changes, if any: No  IPSS:  3 SHIM: 13  Bowel/Bladder complaints, if any:  No  Nausea/Vomiting, if any: No  Pain issues, if any:  0/10  SAFETY ISSUES: Prior radiation?  No Pacemaker/ICD? Yes, Possible current pregnancy? Male Is the patient on methotrexate? No  Current Complaints / other details:

## 2024-01-29 NOTE — Progress Notes (Signed)
 Remote pacemaker transmission.

## 2024-02-02 NOTE — Progress Notes (Signed)
 Radiation Oncology         ([phone] ________________________________  Initial Outpatient Consultation  Name: Derek Hart MRN: [id number]  Date: 02/03/2024  DOB: 06-25-1951  WU:[id number], [name]., MD  Crist Fat, MD   REFERRING PHYSICIAN: Crist Fat, MD  DIAGNOSIS: 73 y.o. gentleman with Stage T1c adenocarcinoma of the prostate with Gleason score of 3+4, and PSA of 5.8.  No diagnosis found.  HISTORY OF PRESENT ILLNESS: Derek Hart is a 73 y.o. male with a diagnosis of prostate cancer. He was initially referred to Dr. Marlou Porch in summer 2023 for LUTS and acute UTI. He was also noted to have a rising PSA-- 3.75 in 10/2021, 4.32 in 10/2022, 5.48 in 04/2023, and 6.03 in 08/2023. This prompted a prostate MRI on 11/28/23 showing: PI-RADS 4 lesion of right posteromedial peripheral zone in mid gland. A repeat PSA obtained on 12/09/23 showed stability at 5.79. The patient proceeded to MRI fusion biopsy of the prostate on 01/09/24.  The prostate volume measured 59 cc.  Out of 15 core biopsies, 6 were positive.  The maximum Gleason score was 3+4, and this was seen in all three cores from ROI, left base (with perineural invasion), and right base. Additionally, a small focus of Gleason 3+3 was seen in left mid.  The patient reviewed the biopsy results with his urologist and he has kindly been referred today for discussion of potential radiation treatment options.   PREVIOUS RADIATION THERAPY: No  PAST MEDICAL HISTORY:  Past Medical History:  Diagnosis Date   AKI (acute kidney injury) (HCC) 06/25/2023   Allergic rhinitis 07/19/2016   Anemia, chronic disease 03/09/2021   Asthma 07/19/2016   Atherosclerotic heart disease of native coronary artery without angina pectoris 07/28/2015    CAD/ MI/ CABG  Overview:  [place].  MI LAD stent. 01/2017 Overview:  He had MI with staged PCI of LAD and later RCA and LCF 2015, EF 40% Overview:  Added automatically from request for surgery [id number]    Bradycardia 06/24/2023   Cardiomyopathy (HCC) 07/19/2016   Overview:  EF 45% or 54% 01/2017   Chest pain 10/25/2020   Chest pain in adult 02/01/2017   Chronic anticoagulation 10/21/2017   Chronic bronchitis (HCC)    Chronic systolic heart failure (HCC) 06/24/2023   COPD (chronic obstructive pulmonary disease) (HCC) 04/26/2015   PFT-06/27/2015-minimal obstructive airways disease with insignificant response to bronchodilator, minimal diffusion defect, normal lung volumes. FEV1/FVC 0.76, DLCO 72%   Coronary artery disease    Coronary artery disease involving native coronary artery of native heart with angina pectoris (HCC) 07/28/2015   CAD/ MI/ CABG     Overview:   [place].  MI LAD stent. 01/2017  Overview:   He had MI with staged PCI of LAD and later RCA and LCF 2015, EF 40%  Overview:   Added automatically from request for surgery [id number]     Dyspnea on exertion 04/26/2015   Elevated alkaline phosphatase level 04/06/2021   Formatting of this note might be different from the original.  Isoenzymes look more liver. [place] ordered 04/06/2021     Elevated PSA 09/28/2021   Emphysema lung (HCC)    "treated for asthma but later found out it was emphysema" (10/15/2017)   Essential hypertension 07/19/2016   Fibrosis of liver 09/26/2021   Formatting of this note might be different from the original.  Neg work up.  No bx because of blood thinners.     GERD (gastroesophageal reflux disease)    Hematuria 08/15/2018  High risk medication use 07/19/2016   Hyperlipidemia    "left lung"   Inguinal hernia 07/19/2016   Overview:  Mild bilateral.   Ischemic cardiomyopathy 05/30/2017   Lung nodule seen on imaging study 07/28/2015   chest x-ray report from 03/05/2014 at [facility] described a vague nodular density in the left upper lobe and recommended follow-up CT scan which was never done.    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   Second degree heart block 06/24/2023   Stage 3 chronic kidney disease (HCC) 07/19/2016   STEMI (ST elevation myocardial infarction) [facility]) 10/18/2017   Feb 2015      PAST SURGICAL HISTORY: Past Surgical History:  Procedure Laterality Date   CARDIAC CATHETERIZATION  11/2016   CATARACT EXTRACTION W/ INTRAOCULAR LENS  IMPLANT, BILATERAL Bilateral    CORONARY ANGIOPLASTY WITH STENT PLACEMENT  12/2013-02/2014   "2 + 3 stents"   CORONARY ANGIOPLASTY WITH STENT PLACEMENT  01/2017   distal LAD   CORONARY STENT INTERVENTION N/A 10/25/2020   Procedure: CORONARY STENT INTERVENTION;  Surgeon: Swaziland, Peter M, MD;  Location: [facility] INVASIVE CV LAB;  Service: Cardiovascular;  Laterality: N/A;   CORONARY ULTRASOUND/IVUS N/A 10/25/2020   Procedure: Intravascular Ultrasound/IVUS;  Surgeon: Swaziland, Peter M, MD;  Location: [facility] INVASIVE CV LAB;  Service: Cardiovascular;  Laterality: N/A;   KNEE ARTHROSCOPY Right 1982   LEFT HEART CATH AND CORONARY ANGIOGRAPHY N/A 10/25/2020   Procedure: LEFT HEART CATH AND CORONARY ANGIOGRAPHY;  Surgeon: Swaziland, Peter M, MD;  Location: [facility] INVASIVE CV LAB;  Service: Cardiovascular;  Laterality: N/A;   PACEMAKER IMPLANT N/A 06/26/2023   Procedure: PACEMAKER IMPLANT;  Surgeon: Regan Lemming, MD;  Location: [facility] INVASIVE CV LAB;  Service: Cardiovascular;  Laterality: N/A;   PROSTATE BIOPSY      FAMILY HISTORY:  Family History  Problem Relation Age of Onset   Hyperlipidemia Mother    Hypertension Mother    Diabetes Mother    Heart attack Maternal Uncle    Heart attack Maternal Grandmother     SOCIAL HISTORY:  Social History   Socioeconomic History   Marital status: Married    Spouse name: Not on file   Number of children: Not on file   Years of education: Not on file    Highest education level: Not on file  Occupational History   Not on file  Tobacco Use   Smoking status: Former    Current packs/day: 0.00    Average packs/day: 1 pack/day for 15.0 years (15.0 ttl pk-yrs)    Types: Cigarettes    Start date: 11/19/1961    Quit date: 11/19/1976    Years since quitting: 47.2   Smokeless tobacco: Former    Types: Chew    Quit date: 1980  Vaping Use   Vaping status: Never Used  Substance and Sexual Activity   Alcohol use: No   Drug use: No   Sexual activity: Not Currently  Other Topics Concern   Not on file  Social History Narrative   Not on file   Social Drivers of Health   Financial Resource Strain: Not on file  Food Insecurity: No Food Insecurity (06/24/2023)   Hunger Vital Sign    Worried About Running Out of Food in the Last Year: Never true  Ran Out of Food in the Last Year: Never true  Transportation Needs: No Transportation Needs (06/24/2023)   PRAPARE - Administrator, Civil Service (Medical): No    Lack of Transportation (Non-Medical): No  Physical Activity: Not on file  Stress: Not on file  Social Connections: Not on file  Intimate Partner Violence: Not At Risk (06/24/2023)   Humiliation, Afraid, Rape, and Kick questionnaire    Fear of Current or Ex-Partner: No    Emotionally Abused: No    Physically Abused: No    Sexually Abused: No    ALLERGIES: Pneumovax 23 [pneumococcal vac polyvalent]  MEDICATIONS:  Current Outpatient Medications  Medication Sig Dispense Refill   apixaban (ELIQUIS) 5 MG TABS tablet Take 1 tablet (5 mg total) by mouth 2 (two) times daily. 180 tablet 3   Ascorbic Acid (VITAMIN C) 1000 MG tablet Take 2,000 mg by mouth daily.     atorvastatin (LIPITOR) 80 MG tablet Take 1 tablet (80 mg total) by mouth every evening. 90 tablet 3   BREO ELLIPTA 200-25 MCG/ACT AEPB Inhale 1 puff into the lungs daily.     carvedilol (COREG) 12.5 MG tablet Take 1.5 tablets (18.75 mg total) by mouth 2 (two) times daily  with a meal. 270 tablet 3   cholecalciferol (VITAMIN D3) 25 MCG (1000 UNIT) tablet Take 1,000 Units by mouth daily.     clopidogrel (PLAVIX) 75 MG tablet Take 1 tablet (75 mg total) by mouth daily with breakfast. 90 tablet 3   Cyanocobalamin (B-12 PO) Take 1 tablet by mouth daily.      montelukast (SINGULAIR) 10 MG tablet Take 10 mg by mouth daily.  2   pantoprazole (PROTONIX) 40 MG tablet Take 1 tablet (40 mg total) by mouth daily. 90 tablet 3   tamsulosin (FLOMAX) 0.4 MG CAPS capsule Take 1 capsule (0.4 mg total) by mouth daily. 30 capsule 1   valsartan (DIOVAN) 80 MG tablet Take 1 tablet (80 mg total) by mouth daily. 90 tablet 3   No current facility-administered medications for this encounter.    REVIEW OF SYSTEMS:  On review of systems, the patient reports that he is doing well overall. He denies any chest pain, shortness of breath, cough, fevers, chills, night sweats, unintended weight changes. He denies any bowel disturbances, and denies abdominal pain, nausea or vomiting. He denies any new musculoskeletal or joint aches or pains. His IPSS was ***, indicating *** urinary symptoms. His SHIM was ***, indicating he {does not have/has mild/moderate/severe} erectile dysfunction. A complete review of systems is obtained and is otherwise negative.    PHYSICAL EXAM:  Wt Readings from Last 3 Encounters:  12/09/23 199 lb (90.3 kg)  09/30/23 190 lb 9.6 oz (86.5 kg)  07/11/23 195 lb 12.8 oz (88.8 kg)   Temp Readings from Last 3 Encounters:  06/27/23 98.1 F (36.7 C) (Oral)  04/08/22 97.9 F (36.6 C) (Oral)  10/26/20 98.6 F (37 C) (Oral)   BP Readings from Last 3 Encounters:  12/09/23 128/76  09/30/23 130/82  07/11/23 130/84   Pulse Readings from Last 3 Encounters:  12/09/23 65  09/30/23 60  07/11/23 69    /10  In general this is a well appearing *** male in no acute distress. He's alert and oriented x4 and appropriate throughout the examination. Cardiopulmonary assessment is  negative for acute distress, and he exhibits normal effort.     KPS = ***  100 - Normal; no complaints; no evidence of disease. 90   -  Able to carry on normal activity; minor signs or symptoms of disease. 80   - Normal activity with effort; some signs or symptoms of disease. 85   - Cares for self; unable to carry on normal activity or to do active work. 60   - Requires occasional assistance, but is able to care for most of his personal needs. 50   - Requires considerable assistance and frequent medical care. 40   - Disabled; requires special care and assistance. 30   - Severely disabled; hospital admission is indicated although death not imminent. 20   - Very sick; hospital admission necessary; active supportive treatment necessary. 10   - Moribund; fatal processes progressing rapidly. 0     - Dead  Karnofsky DA, Abelmann WH, Craver LS and Burchenal [facility] [phone]) The use of the nitrogen mustards in the palliative treatment of carcinoma: with particular reference to bronchogenic carcinoma Cancer 1 634-56  LABORATORY DATA:  Lab Results  Component Value Date   WBC 10.2 06/27/2023   HGB 13.5 06/27/2023   HCT 39.9 06/27/2023   MCV 90.1 06/27/2023   PLT 162 06/27/2023   Lab Results  Component Value Date   NA 144 08/19/2023   K 4.4 08/19/2023   CL 107 (H) 08/19/2023   CO2 23 08/19/2023   Lab Results  Component Value Date   ALT 24 10/25/2020   AST 20 10/25/2020   ALKPHOS 97 10/25/2020   BILITOT 0.7 10/25/2020     RADIOGRAPHY: No results found.    IMPRESSION/PLAN: 1. 73 y.o. gentleman with Stage T1c adenocarcinoma of the prostate with Gleason Score of 3+4, and PSA of 5.8. We discussed the patient's workup and outlined the nature of prostate cancer in this setting. The patient's T stage, Gleason's score, and PSA put him into the favorable intermediate risk group. Accordingly, he is eligible for a variety of potential treatment options including brachytherapy, 5.5 weeks of external  radiation, or prostatectomy. We discussed the available radiation techniques, and focused on the details and logistics of delivery. We discussed and outlined the risks, benefits, short and long-term effects associated with radiotherapy and compared and contrasted these with prostatectomy. We discussed the role of SpaceOAR gel in reducing the rectal toxicity associated with radiotherapy. He appears to have a good understanding of his disease and our treatment recommendations which are of curative intent.  He was encouraged to ask questions that were answered to his stated satisfaction.  At the conclusion of our conversation, the patient is interested in moving forward with ***.  We personally spent *** minutes in this encounter including chart review, reviewing radiological studies, meeting face-to-face with the patient, entering orders and completing documentation.    Marguarite Arbour, PA-C    Margaretmary Dys, MD  [facility]  Radiation Oncology Direct Dial: [phone]  Fax: [phone] [email or web address]  Skype  [email or web address]   This document serves as a record of services personally performed by Margaretmary Dys, MD and Marcello Fennel, PA-C. It was created on their behalf by Mickie Bail, a trained medical scribe. The creation of this record is based on the scribe's personal observations and the provider's statements to them. This document has been checked and approved by the attending provider.

## 2024-02-03 ENCOUNTER — Ambulatory Visit
Admission: RE | Admit: 2024-02-03 | Discharge: 2024-02-03 | Disposition: A | Discharge status: Home / Self Care | Payer: Medicare PPO | Source: Ambulatory Visit | Attending: Radiation Oncology

## 2024-02-03 ENCOUNTER — Encounter: Payer: Self-pay | Admitting: Radiation Oncology

## 2024-02-03 ENCOUNTER — Encounter: Payer: Self-pay | Admitting: Cardiology

## 2024-02-03 ENCOUNTER — Ambulatory Visit
Admission: RE | Admit: 2024-02-03 | Discharge: 2024-02-03 | Disposition: A | Discharge status: Home / Self Care | Payer: Medicare PPO | Source: Ambulatory Visit | Attending: Radiation Oncology | Admitting: Radiation Oncology

## 2024-02-03 VITALS — BP 162/100 | HR 64 | Temp 97.9°F | Resp 20 | Ht 74.0 in | Wt 195.4 lb

## 2024-02-03 DIAGNOSIS — J439 Emphysema, unspecified: Secondary | ICD-10-CM | POA: Diagnosis not present

## 2024-02-03 DIAGNOSIS — I255 Ischemic cardiomyopathy: Secondary | ICD-10-CM | POA: Insufficient documentation

## 2024-02-03 DIAGNOSIS — Z79899 Other long term (current) drug therapy: Secondary | ICD-10-CM | POA: Diagnosis not present

## 2024-02-03 DIAGNOSIS — N183 Chronic kidney disease, stage 3 unspecified: Secondary | ICD-10-CM | POA: Insufficient documentation

## 2024-02-03 DIAGNOSIS — Z86711 Personal history of pulmonary embolism: Secondary | ICD-10-CM | POA: Diagnosis not present

## 2024-02-03 DIAGNOSIS — E782 Mixed hyperlipidemia: Secondary | ICD-10-CM | POA: Insufficient documentation

## 2024-02-03 DIAGNOSIS — K219 Gastro-esophageal reflux disease without esophagitis: Secondary | ICD-10-CM | POA: Insufficient documentation

## 2024-02-03 DIAGNOSIS — I5022 Chronic systolic (congestive) heart failure: Secondary | ICD-10-CM | POA: Insufficient documentation

## 2024-02-03 DIAGNOSIS — I130 Hypertensive heart and chronic kidney disease with heart failure and stage 1 through stage 4 chronic kidney disease, or unspecified chronic kidney disease: Secondary | ICD-10-CM | POA: Insufficient documentation

## 2024-02-03 DIAGNOSIS — D631 Anemia in chronic kidney disease: Secondary | ICD-10-CM | POA: Diagnosis not present

## 2024-02-03 DIAGNOSIS — I2510 Atherosclerotic heart disease of native coronary artery without angina pectoris: Secondary | ICD-10-CM | POA: Insufficient documentation

## 2024-02-03 DIAGNOSIS — E785 Hyperlipidemia, unspecified: Secondary | ICD-10-CM | POA: Diagnosis not present

## 2024-02-03 DIAGNOSIS — Z7902 Long term (current) use of antithrombotics/antiplatelets: Secondary | ICD-10-CM | POA: Insufficient documentation

## 2024-02-03 DIAGNOSIS — C61 Malignant neoplasm of prostate: Secondary | ICD-10-CM | POA: Insufficient documentation

## 2024-02-03 DIAGNOSIS — R748 Abnormal levels of other serum enzymes: Secondary | ICD-10-CM | POA: Diagnosis not present

## 2024-02-03 DIAGNOSIS — I252 Old myocardial infarction: Secondary | ICD-10-CM | POA: Insufficient documentation

## 2024-02-03 DIAGNOSIS — Z7901 Long term (current) use of anticoagulants: Secondary | ICD-10-CM | POA: Diagnosis not present

## 2024-02-03 DIAGNOSIS — Z87891 Personal history of nicotine dependence: Secondary | ICD-10-CM | POA: Diagnosis not present

## 2024-02-03 NOTE — Progress Notes (Signed)
 TO BE COMPLETED BY RADIATION ONCOLOGIST OFFICE:   Patient Name: Derek Hart   Date of Birth: Aug 15, 1951   Radiation Oncologist: Dr. Margaretmary Dys   Site to be Treated: Prostate   Will x-rays >10 MV be used? No   Will the radiation be >10 cm from the device? Yes   Planned Treatment Start Date: 1week   TO BE COMPLETED BY CARDIOLOGIST OFFICE:   Device Information:  Pacemaker [x]      ICD []    Brand: [employer]: [phone] Model #: 2272 Assurity MRI  Serial Number: [id number]     Date of Placement: 06/26/2023  Site of Placement: left upper chest  Remote Device Check--Frequency: q 91 days  Last Check: 12/27/2023 Is the Patient Pacer Dependent?:  Yes []   No [x]   Does cardiologist request Radiation Oncology to schedule device testing by vendor for the following:  Prior to the Initiation of Treatments?  Yes []  No [x]  During Treatments?  Yes []  No [x]  Post Radiation Treatments?  Yes []  No [x]   Is device monitoring necessary by vendor/cardiologist team during treatments?  Yes []   No [x]   Is cardiac monitoring by Radiation Oncology nursing necessary during treatments? Yes []   No [x]   Do you recommend device be relocated prior to Radiation Treatment? Yes []   No [x]   **PLEASE LIST ANY NOTES OR SPECIAL REQUESTS:       CARDIOLOGIST SIGNATURE:  Dr. Loman Brooklyn Per Device Clinic Standing Orders, Wiliam Ke  02/03/2024 11:51 AM  **Please route completed form back to Radiation Oncology Nursing and "P [facility] RAD ONC ADMIN", OR send an update if there will be a delay in having form completed by expected start date.  **Call [phone] if you have any questions or do not get an in-basket response from a Radiation Oncology staff member

## 2024-02-04 ENCOUNTER — Telehealth: Payer: Self-pay | Admitting: *Deleted

## 2024-02-04 NOTE — Telephone Encounter (Signed)
 PATIENT TO BE SEEING DR. PALERMO ON 02-05-24 @ 10 AM FOR CONSULTATION, AND PATIENT IS AWARE OF THIS APPT.

## 2024-02-05 ENCOUNTER — Encounter: Payer: Self-pay | Admitting: Radiation Oncology

## 2024-02-05 ENCOUNTER — Other Ambulatory Visit: Payer: Self-pay

## 2024-02-05 ENCOUNTER — Ambulatory Visit
Admission: RE | Admit: 2024-02-05 | Discharge: 2024-02-05 | Disposition: A | Discharge status: Home / Self Care | Source: Ambulatory Visit | Attending: Radiation Oncology | Admitting: Radiation Oncology

## 2024-02-05 DIAGNOSIS — C61 Malignant neoplasm of prostate: Secondary | ICD-10-CM | POA: Insufficient documentation

## 2024-02-05 DIAGNOSIS — Z510 Encounter for antineoplastic radiation therapy: Secondary | ICD-10-CM | POA: Insufficient documentation

## 2024-02-05 NOTE — Progress Notes (Signed)
 Radiation Oncology         [phone] ________________________________  Name: Derek Hart        MRN: [id number]  Date of Service: 02/05/2024 DOB: [birth year]  XB:MWUXLK, Derek Hart., MD  Margaretmary Dys, MD     REFERRING PHYSICIAN: Berniece Salines, MD   DIAGNOSIS: Adenocarcinoma of the prostate, Gleason 3+4 equal 7, PSA 5.8   HISTORY OF PRESENT ILLNESS: Derek Hart is a 73 y.o. male seen today regarding his diagnosis of Gleason 7 adenocarcinoma of the prostate.  He was originally diagnosed with prostate cancer earlier this year, after biopsies revealed Gleason 3+4 =7 adenocarcinoma seen in 3 cores.  Also seen was a small focus of Gleason 6 adenocarcinoma.  The patient was seen in consultation by Dr. Margaretmary Dys, and treatment options were discussed.  He ultimately elected to pursue definitive external beam radiation; as he lives close to [place], he asked that his treatment be performed at our facility.    PREVIOUS RADIATION THERAPY: No   PAST MEDICAL HISTORY:  Past Medical History:  Diagnosis Date   AKI (acute kidney injury) (HCC) 06/25/2023   Allergic rhinitis 07/19/2016   Anemia, chronic disease 03/09/2021   Asthma 07/19/2016   Atherosclerotic heart disease of native coronary artery without angina pectoris 07/28/2015    CAD/ MI/ CABG  Overview:  Munley.  MI LAD stent. 01/2017 Overview:  He had MI with staged PCI of LAD and later RCA and LCF 2015, EF 40% Overview:  Added automatically from request for surgery [id number]   Bradycardia 06/24/2023   Cardiomyopathy (HCC) 07/19/2016   Overview:  EF 45% or 54% 01/2017   Chest pain 10/25/2020   Chest pain in adult 02/01/2017   Chronic anticoagulation 10/21/2017   Chronic bronchitis (HCC)    Chronic systolic heart failure (HCC) 06/24/2023   COPD (chronic obstructive pulmonary disease) (HCC) 04/26/2015   PFT-06/27/2015-minimal obstructive airways disease with insignificant response to bronchodilator, minimal diffusion defect,  normal lung volumes. FEV1/FVC 0.76, DLCO 72%   Coronary artery disease    Coronary artery disease involving native coronary artery of native heart with angina pectoris (HCC) 07/28/2015   CAD/ MI/ CABG     Overview:   Munley.  MI LAD stent. 01/2017  Overview:   He had MI with staged PCI of LAD and later RCA and LCF 2015, EF 40%  Overview:   Added automatically from request for surgery [id number]     Dyspnea on exertion 04/26/2015   Elevated alkaline phosphatase level 04/06/2021   Formatting of this note might be different from the original.  Isoenzymes look more liver. [place] ordered 04/06/2021     Elevated PSA 09/28/2021   Emphysema lung (HCC)    "treated for asthma but later found out it was emphysema" (10/15/2017)   Essential hypertension 07/19/2016   Fibrosis of liver 09/26/2021   Formatting of this note might be different from the original.  Neg work up.  No bx because of blood thinners.     GERD (gastroesophageal reflux disease)    Hematuria 08/15/2018   High risk medication use 07/19/2016   Hyperlipidemia    "left lung"   Inguinal hernia 07/19/2016   Overview:  Mild bilateral.   Ischemic cardiomyopathy 05/30/2017   Lung nodule seen on imaging study 07/28/2015   chest x-ray report from 03/05/2014 at [facility] described a vague nodular density in the left upper lobe and recommended follow-up CT scan which was never done.    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   Second degree heart block 06/24/2023   Stage 3 chronic kidney disease (HCC) 07/19/2016   STEMI (ST elevation myocardial infarction) [facility]) 10/18/2017   Feb 2015       PAST SURGICAL HISTORY: Past Surgical History:  Procedure Laterality Date   CARDIAC  CATHETERIZATION  11/2016   CATARACT EXTRACTION W/ INTRAOCULAR LENS  IMPLANT, BILATERAL Bilateral    CORONARY ANGIOPLASTY WITH STENT PLACEMENT  12/2013-02/2014   "2 + 3 stents"   CORONARY ANGIOPLASTY WITH STENT PLACEMENT  01/2017   distal LAD   CORONARY STENT INTERVENTION N/A 10/25/2020   Procedure: CORONARY STENT INTERVENTION;  Surgeon: Swaziland, Peter M, MD;  Location: [facility] INVASIVE CV LAB;  Service: Cardiovascular;  Laterality: N/A;   CORONARY ULTRASOUND/IVUS N/A 10/25/2020   Procedure: Intravascular Ultrasound/IVUS;  Surgeon: Swaziland, Peter M, MD;  Location: [facility] INVASIVE CV LAB;  Service: Cardiovascular;  Laterality: N/A;   KNEE ARTHROSCOPY Right 1982   LEFT HEART CATH AND CORONARY ANGIOGRAPHY N/A 10/25/2020   Procedure: LEFT HEART CATH AND CORONARY ANGIOGRAPHY;  Surgeon: Swaziland, Peter M, MD;  Location: [facility] INVASIVE CV LAB;  Service: Cardiovascular;  Laterality: N/A;   PACEMAKER IMPLANT N/A 06/26/2023   Procedure: PACEMAKER IMPLANT;  Surgeon: Regan Lemming, MD;  Location: [facility] INVASIVE CV LAB;  Service: Cardiovascular;  Laterality: N/A;   PROSTATE BIOPSY       FAMILY HISTORY:  Family History  Problem Relation Age of Onset   Hyperlipidemia Mother    Hypertension Mother    Diabetes Mother    Heart attack Maternal Uncle    Heart attack Maternal Grandmother      SOCIAL HISTORY:  reports that he quit smoking about 47 years ago. His smoking use included cigarettes. He started smoking about 62 years ago. He has a 15 pack-year smoking history. He quit smokeless tobacco use about 45 years ago.  His smokeless tobacco use included chew. He reports that he does not drink alcohol and does not use drugs.   ALLERGIES: Pneumovax 23 [pneumococcal vac polyvalent]   MEDICATIONS:  Current Outpatient Medications  Medication Sig Dispense Refill   apixaban (ELIQUIS) 5 MG TABS tablet Take 1 tablet (5 mg total) by mouth 2 (two) times daily. 180 tablet 3   Ascorbic Acid (VITAMIN C) 1000 MG tablet Take  2,000 mg by mouth daily.     atorvastatin (LIPITOR) 80 MG tablet Take 1 tablet (80 mg total) by mouth every evening. 90 tablet 3   BREO ELLIPTA 200-25 MCG/ACT AEPB Inhale 1 puff into the lungs daily.     carvedilol (COREG) 12.5 MG tablet Take 1.5 tablets (18.75 mg total) by mouth 2 (two) times daily with a meal. 270 tablet 3   cholecalciferol (VITAMIN D3) 25 MCG (1000 UNIT) tablet Take 1,000 Units by mouth daily.     clopidogrel (PLAVIX) 75 MG tablet Take 1 tablet (75 mg total) by mouth daily with breakfast. 90 tablet 3   Cyanocobalamin (B-12 PO) Take 1 tablet by mouth daily.      montelukast (SINGULAIR) 10 MG tablet Take 10 mg by mouth daily.  2   pantoprazole (PROTONIX) 40 MG tablet Take 1 tablet (40 mg total) by mouth daily. 90 tablet 3   tamsulosin (FLOMAX) 0.4 MG CAPS capsule Take 1 capsule (0.4 mg total) by mouth daily.  30 capsule 1   valsartan (DIOVAN) 80 MG tablet Take 1 tablet (80 mg total) by mouth daily. 90 tablet 3   No current facility-administered medications for this encounter.     REVIEW OF SYSTEMS: On review of systems, the patient reports that he is doing well overall.  He denies any chest pain, shortness of breath, cough, fevers, chills, night sweats, unintended weight changes.  He denies any bowel or bladder disturbances, and denies abdominal pain, nausea or vomiting.  He denies any new musculoskeletal or joint aches or pains. A complete review of systems is obtained and is otherwise negative.     PHYSICAL EXAM:  Wt Readings from Last 3 Encounters:  02/03/24 195 lb 6.4 oz (88.6 kg)  12/09/23 199 lb (90.3 kg)  09/30/23 190 lb 9.6 oz (86.5 kg)   Temp Readings from Last 3 Encounters:  02/03/24 97.9 F (36.6 C)  06/27/23 98.1 F (36.7 C) (Oral)  04/08/22 97.9 F (36.6 C) (Oral)   BP Readings from Last 3 Encounters:  02/03/24 (!) 162/100  12/09/23 128/76  09/30/23 130/82   Pulse Readings from Last 3 Encounters:  02/03/24 64  12/09/23 65  09/30/23 60   Pain  Assessment Pain Score: 0-No pain/10  In general this is a well appearing male in no acute distress.  He's alert and oriented x4 and appropriate throughout the examination. Cardiopulmonary assessment is negative for acute distress and he exhibits normal effort.     ECOG = 0  0 - Asymptomatic (Fully active, able to carry on all predisease activities without restriction)  1 - Symptomatic but completely ambulatory (Restricted in physically strenuous activity but ambulatory and able to carry out work of a light or sedentary nature. For example, light housework, office work)  2 - Symptomatic, <50% in bed during the day (Ambulatory and capable of all self care but unable to carry out any work activities. Up and about more than 50% of waking hours)  3 - Symptomatic, >50% in bed, but not bedbound (Capable of only limited self-care, confined to bed or chair 50% or more of waking hours)  4 - Bedbound (Completely disabled. Cannot carry on any self-care. Totally confined to bed or chair)  5 - Death   Santiago Glad MM, Creech RH, Tormey DC, et al. [phone]). "Toxicity and response criteria of the [facility]". Am. Derek Clines. Oncol. 5 (6): 649-55       IMPRESSION/PLAN: 1.  The patient is a 73 year old male with favorable intermediate risk adenocarcinoma of the prostate.  I again reviewed with him the rationale behind definitive external beam radiation in his clinical scenario, as well as its potential side effects.  I explained that these may include, but are not limited to, increased urinary frequency, burning with urination, and urgency with urination.  He also discussed possibility of rectal irritation, as well as fatigue.  He expressed understanding, and is agreeable to proceed.  Will make arrangements for him to undergo simulation and treatment planning.   In a visit lasting 25 minutes, greater than 50% of the time was spent face to face discussing the patient's condition, in preparation  for the discussion, and coordinating the patient's care.    Esequiel Kleinfelter A. Thersa Salt, MD   **Disclaimer: This note was dictated with voice recognition software. Similar sounding words can inadvertently be transcribed and this note may contain transcription errors which may not have been corrected upon publication of note.**

## 2024-02-06 NOTE — Progress Notes (Signed)
 Patient was consulted and had CT Simulation with Dr. Thersa Salt for his stage T1c adenocarcinoma of the prostate with Gleason score of 3+4, and PSA of 5.8.   Patient will proceed with treatment under the care of Dr. Thersa Salt.  No additional needs at this time.

## 2024-02-17 DIAGNOSIS — Z510 Encounter for antineoplastic radiation therapy: Secondary | ICD-10-CM | POA: Diagnosis not present

## 2024-02-19 ENCOUNTER — Other Ambulatory Visit: Payer: Self-pay

## 2024-02-19 ENCOUNTER — Ambulatory Visit
Admission: RE | Admit: 2024-02-19 | Discharge: 2024-02-19 | Disposition: A | Discharge status: Home / Self Care | Source: Ambulatory Visit | Attending: Radiation Oncology | Admitting: Radiation Oncology

## 2024-02-19 DIAGNOSIS — Z510 Encounter for antineoplastic radiation therapy: Secondary | ICD-10-CM | POA: Insufficient documentation

## 2024-02-19 DIAGNOSIS — C61 Malignant neoplasm of prostate: Secondary | ICD-10-CM | POA: Insufficient documentation

## 2024-02-19 LAB — RAD ONC ARIA SESSION SUMMARY
Course Elapsed Days: 0
Plan Fractions Treated to Date: 1
Plan Prescribed Dose Per Fraction: 2.5 Gy
Plan Total Fractions Prescribed: 28
Plan Total Prescribed Dose: 70 Gy
Reference Point Dosage Given to Date: 2.5 Gy
Reference Point Session Dosage Given: 2.5 Gy
Session Number: 1

## 2024-02-20 ENCOUNTER — Other Ambulatory Visit: Payer: Self-pay

## 2024-02-20 ENCOUNTER — Ambulatory Visit
Admission: RE | Admit: 2024-02-20 | Discharge: 2024-02-20 | Disposition: A | Discharge status: Home / Self Care | Source: Ambulatory Visit | Attending: Radiation Oncology

## 2024-02-20 DIAGNOSIS — Z510 Encounter for antineoplastic radiation therapy: Secondary | ICD-10-CM | POA: Diagnosis not present

## 2024-02-20 LAB — RAD ONC ARIA SESSION SUMMARY
Course Elapsed Days: 1
Plan Fractions Treated to Date: 2
Plan Prescribed Dose Per Fraction: 2.5 Gy
Plan Total Fractions Prescribed: 28
Plan Total Prescribed Dose: 70 Gy
Reference Point Dosage Given to Date: 5 Gy
Reference Point Session Dosage Given: 2.5 Gy
Session Number: 2

## 2024-02-21 ENCOUNTER — Other Ambulatory Visit: Payer: Self-pay

## 2024-02-21 ENCOUNTER — Ambulatory Visit
Admission: RE | Admit: 2024-02-21 | Discharge: 2024-02-21 | Disposition: A | Discharge status: Home / Self Care | Source: Ambulatory Visit | Attending: Radiation Oncology | Admitting: Radiation Oncology

## 2024-02-21 DIAGNOSIS — Z510 Encounter for antineoplastic radiation therapy: Secondary | ICD-10-CM | POA: Diagnosis not present

## 2024-02-21 LAB — RAD ONC ARIA SESSION SUMMARY
Course Elapsed Days: 2
Plan Fractions Treated to Date: 3
Plan Prescribed Dose Per Fraction: 2.5 Gy
Plan Total Fractions Prescribed: 28
Plan Total Prescribed Dose: 70 Gy
Reference Point Dosage Given to Date: 7.5 Gy
Reference Point Session Dosage Given: 2.5 Gy
Session Number: 3

## 2024-02-24 ENCOUNTER — Ambulatory Visit
Admission: RE | Admit: 2024-02-24 | Discharge: 2024-02-24 | Disposition: A | Discharge status: Home / Self Care | Source: Ambulatory Visit | Attending: Radiation Oncology

## 2024-02-24 ENCOUNTER — Other Ambulatory Visit: Payer: Self-pay

## 2024-02-24 DIAGNOSIS — I2693 Single subsegmental pulmonary embolism without acute cor pulmonale: Secondary | ICD-10-CM

## 2024-02-24 DIAGNOSIS — Z510 Encounter for antineoplastic radiation therapy: Secondary | ICD-10-CM | POA: Diagnosis not present

## 2024-02-24 LAB — RAD ONC ARIA SESSION SUMMARY
Course Elapsed Days: 5
Plan Fractions Treated to Date: 4
Plan Prescribed Dose Per Fraction: 2.5 Gy
Plan Total Fractions Prescribed: 28
Plan Total Prescribed Dose: 70 Gy
Reference Point Dosage Given to Date: 10 Gy
Reference Point Session Dosage Given: 2.5 Gy
Session Number: 4

## 2024-02-24 MED ORDER — APIXABAN 5 MG PO TABS
5.0000 mg | ORAL_TABLET | Freq: Two times a day (BID) | ORAL | 1 refills | Status: DC
Start: 2024-02-24 — End: 2024-09-08

## 2024-02-24 NOTE — Telephone Encounter (Signed)
 Prescription refill request for Eliquis received. Indication: PE Last office visit: 12/09/23 [facility])  Scr: 1.19 (11/11/23)  Age: 73 Weight: 88.6kg  Appropriate dose. Refill sent.

## 2024-02-25 ENCOUNTER — Ambulatory Visit
Admission: RE | Admit: 2024-02-25 | Discharge: 2024-02-25 | Disposition: A | Discharge status: Home / Self Care | Source: Ambulatory Visit | Attending: Radiation Oncology | Admitting: Radiation Oncology

## 2024-02-25 ENCOUNTER — Other Ambulatory Visit: Payer: Self-pay

## 2024-02-25 ENCOUNTER — Ambulatory Visit
Admission: RE | Admit: 2024-02-25 | Discharge: 2024-02-25 | Disposition: A | Discharge status: Home / Self Care | Source: Ambulatory Visit | Attending: Radiation Oncology

## 2024-02-25 DIAGNOSIS — Z510 Encounter for antineoplastic radiation therapy: Secondary | ICD-10-CM | POA: Diagnosis not present

## 2024-02-25 LAB — RAD ONC ARIA SESSION SUMMARY
Course Elapsed Days: 6
Plan Fractions Treated to Date: 5
Plan Prescribed Dose Per Fraction: 2.5 Gy
Plan Total Fractions Prescribed: 28
Plan Total Prescribed Dose: 70 Gy
Reference Point Dosage Given to Date: 12.5 Gy
Reference Point Session Dosage Given: 2.5 Gy
Session Number: 5

## 2024-02-26 ENCOUNTER — Other Ambulatory Visit: Payer: Self-pay

## 2024-02-26 ENCOUNTER — Ambulatory Visit
Admission: RE | Admit: 2024-02-26 | Discharge: 2024-02-26 | Disposition: A | Discharge status: Home / Self Care | Source: Ambulatory Visit | Attending: Radiation Oncology | Admitting: Radiation Oncology

## 2024-02-26 DIAGNOSIS — Z510 Encounter for antineoplastic radiation therapy: Secondary | ICD-10-CM | POA: Diagnosis not present

## 2024-02-26 LAB — RAD ONC ARIA SESSION SUMMARY
Course Elapsed Days: 7
Plan Fractions Treated to Date: 6
Plan Prescribed Dose Per Fraction: 2.5 Gy
Plan Total Fractions Prescribed: 28
Plan Total Prescribed Dose: 70 Gy
Reference Point Dosage Given to Date: 15 Gy
Reference Point Session Dosage Given: 2.5 Gy
Session Number: 6

## 2024-02-27 ENCOUNTER — Other Ambulatory Visit: Payer: Self-pay

## 2024-02-27 ENCOUNTER — Ambulatory Visit
Admission: RE | Admit: 2024-02-27 | Discharge: 2024-02-27 | Disposition: A | Discharge status: Home / Self Care | Source: Ambulatory Visit | Attending: Radiation Oncology | Admitting: Radiation Oncology

## 2024-02-27 DIAGNOSIS — Z510 Encounter for antineoplastic radiation therapy: Secondary | ICD-10-CM | POA: Diagnosis not present

## 2024-02-27 LAB — RAD ONC ARIA SESSION SUMMARY
Course Elapsed Days: 8
Plan Fractions Treated to Date: 7
Plan Prescribed Dose Per Fraction: 2.5 Gy
Plan Total Fractions Prescribed: 28
Plan Total Prescribed Dose: 70 Gy
Reference Point Dosage Given to Date: 17.5 Gy
Reference Point Session Dosage Given: 2.5 Gy
Session Number: 7

## 2024-02-28 ENCOUNTER — Ambulatory Visit
Admission: RE | Admit: 2024-02-28 | Discharge: 2024-02-28 | Disposition: A | Discharge status: Home / Self Care | Source: Ambulatory Visit | Attending: Radiation Oncology | Admitting: Radiation Oncology

## 2024-02-28 ENCOUNTER — Other Ambulatory Visit: Payer: Self-pay

## 2024-02-28 DIAGNOSIS — Z510 Encounter for antineoplastic radiation therapy: Secondary | ICD-10-CM | POA: Diagnosis not present

## 2024-02-28 LAB — RAD ONC ARIA SESSION SUMMARY
Course Elapsed Days: 9
Plan Fractions Treated to Date: 8
Plan Prescribed Dose Per Fraction: 2.5 Gy
Plan Total Fractions Prescribed: 28
Plan Total Prescribed Dose: 70 Gy
Reference Point Dosage Given to Date: 20 Gy
Reference Point Session Dosage Given: 2.5 Gy
Session Number: 8

## 2024-03-02 ENCOUNTER — Other Ambulatory Visit: Payer: Self-pay

## 2024-03-02 ENCOUNTER — Ambulatory Visit
Admission: RE | Admit: 2024-03-02 | Discharge: 2024-03-02 | Disposition: A | Discharge status: Home / Self Care | Source: Ambulatory Visit | Attending: Radiation Oncology | Admitting: Radiation Oncology

## 2024-03-02 DIAGNOSIS — Z510 Encounter for antineoplastic radiation therapy: Secondary | ICD-10-CM | POA: Diagnosis not present

## 2024-03-02 LAB — RAD ONC ARIA SESSION SUMMARY
Course Elapsed Days: 12
Plan Fractions Treated to Date: 9
Plan Prescribed Dose Per Fraction: 2.5 Gy
Plan Total Fractions Prescribed: 28
Plan Total Prescribed Dose: 70 Gy
Reference Point Dosage Given to Date: 22.5 Gy
Reference Point Session Dosage Given: 2.5 Gy
Session Number: 9

## 2024-03-03 ENCOUNTER — Ambulatory Visit
Admission: RE | Admit: 2024-03-03 | Discharge: 2024-03-03 | Disposition: A | Discharge status: Home / Self Care | Source: Ambulatory Visit | Attending: Radiation Oncology | Admitting: Radiation Oncology

## 2024-03-03 ENCOUNTER — Other Ambulatory Visit: Payer: Self-pay

## 2024-03-03 DIAGNOSIS — Z510 Encounter for antineoplastic radiation therapy: Secondary | ICD-10-CM | POA: Diagnosis not present

## 2024-03-03 LAB — RAD ONC ARIA SESSION SUMMARY
Course Elapsed Days: 13
Plan Fractions Treated to Date: 10
Plan Prescribed Dose Per Fraction: 2.5 Gy
Plan Total Fractions Prescribed: 28
Plan Total Prescribed Dose: 70 Gy
Reference Point Dosage Given to Date: 25 Gy
Reference Point Session Dosage Given: 2.5 Gy
Session Number: 10

## 2024-03-04 ENCOUNTER — Ambulatory Visit
Admission: RE | Admit: 2024-03-04 | Discharge: 2024-03-04 | Disposition: A | Discharge status: Home / Self Care | Source: Ambulatory Visit | Attending: Radiation Oncology | Admitting: Radiation Oncology

## 2024-03-04 ENCOUNTER — Other Ambulatory Visit: Payer: Self-pay

## 2024-03-04 DIAGNOSIS — Z510 Encounter for antineoplastic radiation therapy: Secondary | ICD-10-CM | POA: Diagnosis not present

## 2024-03-04 LAB — RAD ONC ARIA SESSION SUMMARY
Course Elapsed Days: 14
Plan Fractions Treated to Date: 11
Plan Prescribed Dose Per Fraction: 2.5 Gy
Plan Total Fractions Prescribed: 28
Plan Total Prescribed Dose: 70 Gy
Reference Point Dosage Given to Date: 27.5 Gy
Reference Point Session Dosage Given: 2.5 Gy
Session Number: 11

## 2024-03-05 ENCOUNTER — Other Ambulatory Visit: Payer: Self-pay

## 2024-03-05 ENCOUNTER — Ambulatory Visit
Admission: RE | Admit: 2024-03-05 | Discharge: 2024-03-05 | Disposition: A | Discharge status: Home / Self Care | Source: Ambulatory Visit | Attending: Radiation Oncology | Admitting: Radiation Oncology

## 2024-03-05 DIAGNOSIS — Z510 Encounter for antineoplastic radiation therapy: Secondary | ICD-10-CM | POA: Diagnosis not present

## 2024-03-05 LAB — RAD ONC ARIA SESSION SUMMARY
Course Elapsed Days: 15
Plan Fractions Treated to Date: 12
Plan Prescribed Dose Per Fraction: 2.5 Gy
Plan Total Fractions Prescribed: 28
Plan Total Prescribed Dose: 70 Gy
Reference Point Dosage Given to Date: 30 Gy
Reference Point Session Dosage Given: 2.5 Gy
Session Number: 12

## 2024-03-06 ENCOUNTER — Other Ambulatory Visit: Payer: Self-pay

## 2024-03-06 ENCOUNTER — Ambulatory Visit
Admission: RE | Admit: 2024-03-06 | Discharge: 2024-03-06 | Disposition: A | Discharge status: Home / Self Care | Source: Ambulatory Visit | Attending: Radiation Oncology | Admitting: Radiation Oncology

## 2024-03-06 DIAGNOSIS — Z510 Encounter for antineoplastic radiation therapy: Secondary | ICD-10-CM | POA: Diagnosis not present

## 2024-03-06 LAB — RAD ONC ARIA SESSION SUMMARY
Course Elapsed Days: 16
Plan Fractions Treated to Date: 13
Plan Prescribed Dose Per Fraction: 2.5 Gy
Plan Total Fractions Prescribed: 28
Plan Total Prescribed Dose: 70 Gy
Reference Point Dosage Given to Date: 32.5 Gy
Reference Point Session Dosage Given: 2.5 Gy
Session Number: 13

## 2024-03-09 ENCOUNTER — Other Ambulatory Visit: Payer: Self-pay

## 2024-03-09 ENCOUNTER — Ambulatory Visit
Admission: RE | Admit: 2024-03-09 | Discharge: 2024-03-09 | Disposition: A | Discharge status: Home / Self Care | Source: Ambulatory Visit | Attending: Radiation Oncology | Admitting: Radiation Oncology

## 2024-03-09 DIAGNOSIS — Z510 Encounter for antineoplastic radiation therapy: Secondary | ICD-10-CM | POA: Diagnosis not present

## 2024-03-09 LAB — RAD ONC ARIA SESSION SUMMARY
Course Elapsed Days: 19
Plan Fractions Treated to Date: 14
Plan Prescribed Dose Per Fraction: 2.5 Gy
Plan Total Fractions Prescribed: 28
Plan Total Prescribed Dose: 70 Gy
Reference Point Dosage Given to Date: 35 Gy
Reference Point Session Dosage Given: 2.5 Gy
Session Number: 14

## 2024-03-10 ENCOUNTER — Other Ambulatory Visit: Payer: Self-pay

## 2024-03-10 ENCOUNTER — Ambulatory Visit
Admission: RE | Admit: 2024-03-10 | Discharge: 2024-03-10 | Disposition: A | Discharge status: Home / Self Care | Source: Ambulatory Visit | Attending: Radiation Oncology | Admitting: Radiation Oncology

## 2024-03-10 DIAGNOSIS — Z510 Encounter for antineoplastic radiation therapy: Secondary | ICD-10-CM | POA: Diagnosis not present

## 2024-03-10 LAB — RAD ONC ARIA SESSION SUMMARY
Course Elapsed Days: 20
Plan Fractions Treated to Date: 15
Plan Prescribed Dose Per Fraction: 2.5 Gy
Plan Total Fractions Prescribed: 28
Plan Total Prescribed Dose: 70 Gy
Reference Point Dosage Given to Date: 37.5 Gy
Reference Point Session Dosage Given: 2.5 Gy
Session Number: 15

## 2024-03-11 ENCOUNTER — Ambulatory Visit
Admission: RE | Admit: 2024-03-11 | Discharge: 2024-03-11 | Disposition: A | Discharge status: Home / Self Care | Source: Ambulatory Visit | Attending: Radiation Oncology | Admitting: Radiation Oncology

## 2024-03-11 ENCOUNTER — Other Ambulatory Visit: Payer: Self-pay

## 2024-03-11 DIAGNOSIS — Z510 Encounter for antineoplastic radiation therapy: Secondary | ICD-10-CM | POA: Diagnosis not present

## 2024-03-11 LAB — RAD ONC ARIA SESSION SUMMARY
Course Elapsed Days: 21
Plan Fractions Treated to Date: 16
Plan Prescribed Dose Per Fraction: 2.5 Gy
Plan Total Fractions Prescribed: 28
Plan Total Prescribed Dose: 70 Gy
Reference Point Dosage Given to Date: 40 Gy
Reference Point Session Dosage Given: 2.5 Gy
Session Number: 16

## 2024-03-12 ENCOUNTER — Ambulatory Visit
Admission: RE | Admit: 2024-03-12 | Discharge: 2024-03-12 | Disposition: A | Discharge status: Home / Self Care | Source: Ambulatory Visit | Attending: Radiation Oncology

## 2024-03-12 ENCOUNTER — Other Ambulatory Visit: Payer: Self-pay

## 2024-03-12 DIAGNOSIS — Z510 Encounter for antineoplastic radiation therapy: Secondary | ICD-10-CM | POA: Diagnosis not present

## 2024-03-12 LAB — RAD ONC ARIA SESSION SUMMARY
Course Elapsed Days: 22
Plan Fractions Treated to Date: 17
Plan Prescribed Dose Per Fraction: 2.5 Gy
Plan Total Fractions Prescribed: 28
Plan Total Prescribed Dose: 70 Gy
Reference Point Dosage Given to Date: 42.5 Gy
Reference Point Session Dosage Given: 2.5 Gy
Session Number: 17

## 2024-03-13 ENCOUNTER — Ambulatory Visit
Admission: RE | Admit: 2024-03-13 | Discharge: 2024-03-13 | Disposition: A | Discharge status: Home / Self Care | Source: Ambulatory Visit | Attending: Radiation Oncology | Admitting: Radiation Oncology

## 2024-03-13 ENCOUNTER — Other Ambulatory Visit: Payer: Self-pay

## 2024-03-13 DIAGNOSIS — Z510 Encounter for antineoplastic radiation therapy: Secondary | ICD-10-CM | POA: Diagnosis not present

## 2024-03-13 LAB — RAD ONC ARIA SESSION SUMMARY
Course Elapsed Days: 23
Plan Fractions Treated to Date: 18
Plan Prescribed Dose Per Fraction: 2.5 Gy
Plan Total Fractions Prescribed: 28
Plan Total Prescribed Dose: 70 Gy
Reference Point Dosage Given to Date: 45 Gy
Reference Point Session Dosage Given: 2.5 Gy
Session Number: 18

## 2024-03-16 ENCOUNTER — Other Ambulatory Visit: Payer: Self-pay

## 2024-03-16 ENCOUNTER — Ambulatory Visit
Admission: RE | Admit: 2024-03-16 | Discharge: 2024-03-16 | Disposition: A | Discharge status: Home / Self Care | Source: Ambulatory Visit | Attending: Radiation Oncology

## 2024-03-16 DIAGNOSIS — Z510 Encounter for antineoplastic radiation therapy: Secondary | ICD-10-CM | POA: Diagnosis not present

## 2024-03-16 LAB — RAD ONC ARIA SESSION SUMMARY
Course Elapsed Days: 26
Plan Fractions Treated to Date: 19
Plan Prescribed Dose Per Fraction: 2.5 Gy
Plan Total Fractions Prescribed: 28
Plan Total Prescribed Dose: 70 Gy
Reference Point Dosage Given to Date: 47.5 Gy
Reference Point Session Dosage Given: 2.5 Gy
Session Number: 19

## 2024-03-17 ENCOUNTER — Ambulatory Visit
Admission: RE | Admit: 2024-03-17 | Discharge: 2024-03-17 | Disposition: A | Discharge status: Home / Self Care | Source: Ambulatory Visit | Attending: Radiation Oncology

## 2024-03-17 ENCOUNTER — Ambulatory Visit
Admission: RE | Admit: 2024-03-17 | Discharge: 2024-03-17 | Disposition: A | Discharge status: Home / Self Care | Source: Ambulatory Visit | Attending: Radiation Oncology | Admitting: Radiation Oncology

## 2024-03-17 ENCOUNTER — Other Ambulatory Visit: Payer: Self-pay

## 2024-03-17 DIAGNOSIS — Z510 Encounter for antineoplastic radiation therapy: Secondary | ICD-10-CM | POA: Diagnosis not present

## 2024-03-17 LAB — RAD ONC ARIA SESSION SUMMARY
Course Elapsed Days: 27
Plan Fractions Treated to Date: 20
Plan Prescribed Dose Per Fraction: 2.5 Gy
Plan Total Fractions Prescribed: 28
Plan Total Prescribed Dose: 70 Gy
Reference Point Dosage Given to Date: 50 Gy
Reference Point Session Dosage Given: 2.5 Gy
Session Number: 20

## 2024-03-18 ENCOUNTER — Ambulatory Visit
Admission: RE | Admit: 2024-03-18 | Discharge: 2024-03-18 | Disposition: A | Discharge status: Home / Self Care | Source: Ambulatory Visit | Attending: Radiation Oncology | Admitting: Radiation Oncology

## 2024-03-18 ENCOUNTER — Other Ambulatory Visit: Payer: Self-pay

## 2024-03-18 DIAGNOSIS — Z510 Encounter for antineoplastic radiation therapy: Secondary | ICD-10-CM | POA: Diagnosis not present

## 2024-03-18 LAB — RAD ONC ARIA SESSION SUMMARY
Course Elapsed Days: 28
Plan Fractions Treated to Date: 21
Plan Prescribed Dose Per Fraction: 2.5 Gy
Plan Total Fractions Prescribed: 28
Plan Total Prescribed Dose: 70 Gy
Reference Point Dosage Given to Date: 52.5 Gy
Reference Point Session Dosage Given: 2.5 Gy
Session Number: 21

## 2024-03-19 ENCOUNTER — Ambulatory Visit
Admission: RE | Admit: 2024-03-19 | Discharge: 2024-03-19 | Disposition: A | Discharge status: Home / Self Care | Source: Ambulatory Visit | Attending: Radiation Oncology | Admitting: Radiation Oncology

## 2024-03-19 ENCOUNTER — Other Ambulatory Visit: Payer: Self-pay

## 2024-03-19 DIAGNOSIS — C61 Malignant neoplasm of prostate: Secondary | ICD-10-CM | POA: Insufficient documentation

## 2024-03-19 DIAGNOSIS — Z510 Encounter for antineoplastic radiation therapy: Secondary | ICD-10-CM | POA: Diagnosis present

## 2024-03-19 LAB — RAD ONC ARIA SESSION SUMMARY
Course Elapsed Days: 29
Plan Fractions Treated to Date: 22
Plan Prescribed Dose Per Fraction: 2.5 Gy
Plan Total Fractions Prescribed: 28
Plan Total Prescribed Dose: 70 Gy
Reference Point Dosage Given to Date: 55 Gy
Reference Point Session Dosage Given: 2.5 Gy
Session Number: 22

## 2024-03-20 ENCOUNTER — Ambulatory Visit
Admission: RE | Admit: 2024-03-20 | Discharge: 2024-03-20 | Disposition: A | Discharge status: Home / Self Care | Source: Ambulatory Visit | Attending: Radiation Oncology

## 2024-03-20 ENCOUNTER — Other Ambulatory Visit: Payer: Self-pay

## 2024-03-20 DIAGNOSIS — Z510 Encounter for antineoplastic radiation therapy: Secondary | ICD-10-CM | POA: Diagnosis not present

## 2024-03-20 LAB — RAD ONC ARIA SESSION SUMMARY
Course Elapsed Days: 30
Plan Fractions Treated to Date: 23
Plan Prescribed Dose Per Fraction: 2.5 Gy
Plan Total Fractions Prescribed: 28
Plan Total Prescribed Dose: 70 Gy
Reference Point Dosage Given to Date: 57.5 Gy
Reference Point Session Dosage Given: 2.5 Gy
Session Number: 23

## 2024-03-23 ENCOUNTER — Ambulatory Visit
Admission: RE | Admit: 2024-03-23 | Discharge: 2024-03-23 | Disposition: A | Discharge status: Home / Self Care | Source: Ambulatory Visit | Attending: Radiation Oncology

## 2024-03-23 ENCOUNTER — Other Ambulatory Visit: Payer: Self-pay

## 2024-03-23 DIAGNOSIS — Z510 Encounter for antineoplastic radiation therapy: Secondary | ICD-10-CM | POA: Diagnosis not present

## 2024-03-23 LAB — RAD ONC ARIA SESSION SUMMARY
Course Elapsed Days: 33
Plan Fractions Treated to Date: 24
Plan Prescribed Dose Per Fraction: 2.5 Gy
Plan Total Fractions Prescribed: 28
Plan Total Prescribed Dose: 70 Gy
Reference Point Dosage Given to Date: 60 Gy
Reference Point Session Dosage Given: 2.5 Gy
Session Number: 24

## 2024-03-24 ENCOUNTER — Other Ambulatory Visit: Payer: Self-pay

## 2024-03-24 ENCOUNTER — Ambulatory Visit
Admission: RE | Admit: 2024-03-24 | Discharge: 2024-03-24 | Disposition: A | Discharge status: Home / Self Care | Source: Ambulatory Visit | Attending: Radiation Oncology

## 2024-03-24 ENCOUNTER — Ambulatory Visit
Admission: RE | Admit: 2024-03-24 | Discharge: 2024-03-24 | Disposition: A | Discharge status: Home / Self Care | Source: Ambulatory Visit | Attending: Radiation Oncology | Admitting: Radiation Oncology

## 2024-03-24 DIAGNOSIS — Z510 Encounter for antineoplastic radiation therapy: Secondary | ICD-10-CM | POA: Diagnosis not present

## 2024-03-24 LAB — RAD ONC ARIA SESSION SUMMARY
Course Elapsed Days: 34
Plan Fractions Treated to Date: 25
Plan Prescribed Dose Per Fraction: 2.5 Gy
Plan Total Fractions Prescribed: 28
Plan Total Prescribed Dose: 70 Gy
Reference Point Dosage Given to Date: 62.5 Gy
Reference Point Session Dosage Given: 2.5 Gy
Session Number: 25

## 2024-03-25 ENCOUNTER — Ambulatory Visit
Admission: RE | Admit: 2024-03-25 | Discharge: 2024-03-25 | Disposition: A | Discharge status: Home / Self Care | Source: Ambulatory Visit | Attending: Radiation Oncology | Admitting: Radiation Oncology

## 2024-03-25 ENCOUNTER — Other Ambulatory Visit: Payer: Self-pay

## 2024-03-25 DIAGNOSIS — Z510 Encounter for antineoplastic radiation therapy: Secondary | ICD-10-CM | POA: Diagnosis not present

## 2024-03-25 LAB — RAD ONC ARIA SESSION SUMMARY
Course Elapsed Days: 35
Plan Fractions Treated to Date: 26
Plan Prescribed Dose Per Fraction: 2.5 Gy
Plan Total Fractions Prescribed: 28
Plan Total Prescribed Dose: 70 Gy
Reference Point Dosage Given to Date: 65 Gy
Reference Point Session Dosage Given: 2.5 Gy
Session Number: 26

## 2024-03-26 ENCOUNTER — Ambulatory Visit
Admission: RE | Admit: 2024-03-26 | Discharge: 2024-03-26 | Disposition: A | Discharge status: Home / Self Care | Source: Ambulatory Visit | Attending: Radiation Oncology | Admitting: Radiation Oncology

## 2024-03-26 ENCOUNTER — Other Ambulatory Visit: Payer: Self-pay

## 2024-03-26 DIAGNOSIS — Z510 Encounter for antineoplastic radiation therapy: Secondary | ICD-10-CM | POA: Diagnosis not present

## 2024-03-26 LAB — RAD ONC ARIA SESSION SUMMARY
Course Elapsed Days: 36
Plan Fractions Treated to Date: 27
Plan Prescribed Dose Per Fraction: 2.5 Gy
Plan Total Fractions Prescribed: 28
Plan Total Prescribed Dose: 70 Gy
Reference Point Dosage Given to Date: 67.5 Gy
Reference Point Session Dosage Given: 2.5 Gy
Session Number: 27

## 2024-03-27 ENCOUNTER — Ambulatory Visit
Admission: RE | Admit: 2024-03-27 | Discharge: 2024-03-27 | Disposition: A | Discharge status: Home / Self Care | Source: Ambulatory Visit | Attending: Radiation Oncology | Admitting: Radiation Oncology

## 2024-03-27 ENCOUNTER — Ambulatory Visit (INDEPENDENT_AMBULATORY_CARE_PROVIDER_SITE_OTHER): Payer: Medicare PPO

## 2024-03-27 ENCOUNTER — Other Ambulatory Visit: Payer: Self-pay

## 2024-03-27 DIAGNOSIS — R55 Syncope and collapse: Secondary | ICD-10-CM

## 2024-03-27 DIAGNOSIS — Z510 Encounter for antineoplastic radiation therapy: Secondary | ICD-10-CM | POA: Diagnosis not present

## 2024-03-27 LAB — CUP PACEART REMOTE DEVICE CHECK
Battery Remaining Longevity: 116 mo
Battery Remaining Percentage: 95 %
Battery Voltage: 2.99 V
Brady Statistic AP VP Percent: 17 %
Brady Statistic AP VS Percent: 4.6 %
Brady Statistic AS VP Percent: 63 %
Brady Statistic AS VS Percent: 14 %
Brady Statistic RA Percent Paced: 20 %
Brady Statistic RV Percent Paced: 80 %
Date Time Interrogation Session: 20250509040016
Implantable Lead Connection Status: 753985
Implantable Lead Connection Status: 753985
Implantable Lead Implant Date: 20240807
Implantable Lead Implant Date: 20240807
Implantable Lead Location: 753859
Implantable Lead Location: 753860
Implantable Pulse Generator Implant Date: 20240807
Lead Channel Impedance Value: 460 Ohm
Lead Channel Impedance Value: 510 Ohm
Lead Channel Pacing Threshold Amplitude: 0.5 V
Lead Channel Pacing Threshold Amplitude: 0.875 V
Lead Channel Pacing Threshold Pulse Width: 0.5 ms
Lead Channel Pacing Threshold Pulse Width: 0.5 ms
Lead Channel Sensing Intrinsic Amplitude: 5 mV
Lead Channel Sensing Intrinsic Amplitude: 5.2 mV
Lead Channel Setting Pacing Amplitude: 1.125
Lead Channel Setting Pacing Amplitude: 1.5 V
Lead Channel Setting Pacing Pulse Width: 0.5 ms
Lead Channel Setting Sensing Sensitivity: 2 mV
Pulse Gen Model: 2272
Pulse Gen Serial Number: 5819145

## 2024-03-27 LAB — RAD ONC ARIA SESSION SUMMARY
Course Elapsed Days: 37
Plan Fractions Treated to Date: 28
Plan Prescribed Dose Per Fraction: 2.5 Gy
Plan Total Fractions Prescribed: 28
Plan Total Prescribed Dose: 70 Gy
Reference Point Dosage Given to Date: 70 Gy
Reference Point Session Dosage Given: 2.5 Gy
Session Number: 28

## 2024-03-30 NOTE — Radiation Completion Notes (Signed)
 Patient Name: Derek Hart, Derek Hart MRN: [id number] Date of Birth: 03-30-1951 Referring Physician: BENJAMIN HERRICK, M.D. Date of Service: 2024-03-30 Radiation Oncologist: Verlie Glisson, M.D. [facility]                             RADIATION ONCOLOGY END OF TREATMENT NOTE     Diagnosis: C61 Malignant neoplasm of prostate Staging on 2024-01-09: Malignant neoplasm of prostate (HCC) T=cT1c, N=cN0, M=cM0 Intent: Curative     ==========DELIVERED PLANS==========  First Treatment Date: 2024-02-19 Last Treatment Date: 2024-03-27   Plan Name: Prostate Site: Prostate Technique: IMRT Mode: Photon Dose Per Fraction: 2.5 Gy Prescribed Dose (Delivered / Prescribed): 70 Gy / 70 Gy Prescribed Fxs (Delivered / Prescribed): 28 / 28     ==========ON TREATMENT VISIT DATES========== 2024-02-25, 2024-03-03, 2024-03-10, 2024-03-17, 2024-03-24, 2024-03-27     ==========UPCOMING VISITS========== 06/26/2024 [facility] REMOTE PACER [facility] HVT DEVICE REMOTES  06/10/2024 [facility] OFFICE VISIT Hassan Links, MD  04/30/2024 [facility] RAD ONC FOLLOW UP 20 Verlie Glisson, MD  03/31/2024 [facility] [facility] ONC WEEKLY [facility] Jacqulyn Maxcy, MD        ==========APPENDIX - ON TREATMENT VISIT NOTES==========   See weekly On Treatment Notes in Epic for details in the Media tab (listed as Progress notes on the On Treatment Visit Dates listed above).

## 2024-03-31 ENCOUNTER — Ambulatory Visit: Admitting: Radiation Oncology

## 2024-04-01 ENCOUNTER — Ambulatory Visit (HOSPITAL_BASED_OUTPATIENT_CLINIC_OR_DEPARTMENT_OTHER)
Admission: RE | Admit: 2024-04-01 | Discharge: 2024-04-01 | Disposition: A | Discharge status: Home / Self Care | Payer: Self-pay | Source: Ambulatory Visit | Attending: Family Medicine | Admitting: Family Medicine

## 2024-04-01 ENCOUNTER — Encounter (HOSPITAL_BASED_OUTPATIENT_CLINIC_OR_DEPARTMENT_OTHER): Payer: Self-pay

## 2024-04-01 VITALS — BP 133/84 | HR 76 | Temp 98.6°F | Resp 20

## 2024-04-01 DIAGNOSIS — R051 Acute cough: Secondary | ICD-10-CM

## 2024-04-01 MED ORDER — TRIAMCINOLONE ACETONIDE 40 MG/ML IJ SUSP
40.0000 mg | Freq: Once | INTRAMUSCULAR | Status: AC
  Administered 2024-04-01: 40 mg via INTRAMUSCULAR

## 2024-04-01 NOTE — ED Triage Notes (Signed)
 Cough, sinus drainage, congestion since Monday. Denies fever. Patient just finished radiation treatment Friday for prostate cancer and would like a kenalog  shot.

## 2024-04-01 NOTE — ED Provider Notes (Signed)
 Derek Hart [facility]ARE    CSN: [id number] Arrival date & time: 04/01/24  1102      History   Chief Complaint Chief Complaint  Patient presents with   Cough    Cough and upper airway congestion - Entered by patient    HPI [name] is a 73 y.o. male.   Pt is a 73 year old male that presents with cough, sinus drainage, congestion since Monday. Denies fever. Patient just finished radiation treatment Friday for prostate cancer and would like a kenalog  shot.  Has had some wheezing at night. Hx of allergies and asthma. Uses steroid inhaler and Singulair .  No fever, chills, body aches or night sweats.   Cough   Past Medical History:  Diagnosis Date   AKI (acute kidney injury) (HCC) 06/25/2023   Allergic rhinitis 07/19/2016   Anemia, chronic disease 03/09/2021   Asthma 07/19/2016   Atherosclerotic heart disease of native coronary artery without angina pectoris 07/28/2015    CAD/ MI/ CABG  Overview:  Munley.  MI LAD stent. 01/2017 Overview:  He had MI with staged PCI of LAD and later RCA and LCF 2015, EF 40% Overview:  Added automatically from request for surgery [id number]   Bradycardia 06/24/2023   Cardiomyopathy (HCC) 07/19/2016   Overview:  EF 45% or 54% 01/2017   Chest pain 10/25/2020   Chest pain in adult 02/01/2017   Chronic anticoagulation 10/21/2017   Chronic bronchitis (HCC)    Chronic systolic heart failure (HCC) 06/24/2023   COPD (chronic obstructive pulmonary disease) (HCC) 04/26/2015   PFT-06/27/2015-minimal obstructive airways disease with insignificant response to bronchodilator, minimal diffusion defect, normal lung volumes. FEV1/FVC 0.76, DLCO 72%   Coronary artery disease    Coronary artery disease involving native coronary artery of native heart with angina pectoris (HCC) 07/28/2015   CAD/ MI/ CABG     Overview:   Munley.  MI LAD stent. 01/2017  Overview:   He had MI with staged PCI of LAD and later RCA and LCF 2015, EF 40%  Overview:   Added automatically from  request for surgery [id number]     Dyspnea on exertion 04/26/2015   Elevated alkaline phosphatase level 04/06/2021   Formatting of this note might be different from the original.  Isoenzymes look more liver. US  ordered 04/06/2021     Elevated PSA 09/28/2021   Emphysema lung (HCC)    "treated for asthma but later found out it was emphysema" (10/15/2017)   Essential hypertension 07/19/2016   Fibrosis of liver 09/26/2021   Formatting of this note might be different from the original.  Neg work up.  No bx because of blood thinners.     GERD (gastroesophageal reflux disease)    Hematuria 08/15/2018   High risk medication use 07/19/2016   Hyperlipidemia    "left lung"   Inguinal hernia 07/19/2016   Overview:  Mild bilateral.   Ischemic cardiomyopathy 05/30/2017   Lung nodule seen on imaging study 07/28/2015   chest x-ray report from 03/05/2014 at [facility] described a vague nodular density in the left upper lobe and recommended follow-up CT scan which was never done.    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   Second degree heart block 06/24/2023   Stage 3 chronic kidney disease (HCC) 07/19/2016  STEMI (ST elevation myocardial infarction) [facility]) 10/18/2017   Feb 2015    Patient Active Problem List   Diagnosis Date Noted   Malignant neoplasm of prostate (HCC) 02/03/2024   AKI (acute kidney injury) (HCC) 06/25/2023   Second degree heart block 06/24/2023   Bradycardia 06/24/2023   Chronic systolic heart failure (HCC) 06/24/2023   Elevated PSA 09/28/2021   Fibrosis of liver 09/26/2021   Elevated alkaline phosphatase level 04/06/2021   Anemia, chronic disease 03/09/2021   MI (myocardial infarction) (HCC)     Hyperlipidemia    GERD (gastroesophageal reflux disease)    Emphysema lung (HCC)    Coronary artery disease    Chronic bronchitis (HCC)    Chest pain 10/25/2020   NSTEMI (non-ST elevated myocardial infarction) (HCC) 10/25/2020   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   Hematuria 08/15/2018   Chronic anticoagulation 10/21/2017   STEMI (ST elevation myocardial infarction) (HCC) 10/18/2017   Pulmonary embolism (HCC) 10/15/2017   Ischemic cardiomyopathy 05/30/2017   Old MI (myocardial infarction) 05/30/2017   Chest pain in adult 02/01/2017   Allergic rhinitis 07/19/2016   Asthma 07/19/2016   Essential hypertension 07/19/2016   High risk medication use 07/19/2016   Inguinal hernia 07/19/2016   Mixed hyperlipidemia 07/19/2016   Prediabetes 07/19/2016   Stage 3 chronic kidney disease (HCC) 07/19/2016   Cardiomyopathy (HCC) 07/19/2016   Lung nodule seen on imaging study 07/28/2015   Coronary artery disease involving native coronary artery of native heart with angina pectoris (HCC) 07/28/2015   Atherosclerotic heart disease of native coronary artery without angina pectoris 07/28/2015   Dyspnea on exertion 04/26/2015   COPD (chronic obstructive pulmonary disease) (HCC) 04/26/2015    Past Surgical History:  Procedure Laterality Date   CARDIAC CATHETERIZATION  11/2016   CATARACT EXTRACTION W/ INTRAOCULAR LENS  IMPLANT, BILATERAL Bilateral    CORONARY ANGIOPLASTY WITH STENT PLACEMENT  12/2013-02/2014   "2 + 3 stents"   CORONARY ANGIOPLASTY WITH STENT PLACEMENT  01/2017   distal LAD   CORONARY STENT INTERVENTION N/A 10/25/2020   Procedure: CORONARY STENT INTERVENTION;  Surgeon: Swaziland, Peter M, MD;  Location: [facility] INVASIVE CV LAB;  Service: Cardiovascular;  Laterality: N/A;   CORONARY ULTRASOUND/IVUS N/A 10/25/2020   Procedure: Intravascular Ultrasound/IVUS;  Surgeon: Swaziland, Peter M, MD;  Location: [facility] INVASIVE CV LAB;  Service: Cardiovascular;  Laterality: N/A;   KNEE ARTHROSCOPY  Right 1982   LEFT HEART CATH AND CORONARY ANGIOGRAPHY N/A 10/25/2020   Procedure: LEFT HEART CATH AND CORONARY ANGIOGRAPHY;  Surgeon: Swaziland, Peter M, MD;  Location: [facility] INVASIVE CV LAB;  Service: Cardiovascular;  Laterality: N/A;   PACEMAKER IMPLANT N/A 06/26/2023   Procedure: PACEMAKER IMPLANT;  Surgeon: Lei Pump, MD;  Location: [facility] INVASIVE CV LAB;  Service: Cardiovascular;  Laterality: N/A;   PROSTATE BIOPSY         Home Medications    Prior to Admission medications   Medication Sig Start Date End Date Taking? Authorizing Provider  apixaban  (ELIQUIS ) 5 MG TABS tablet Take 1 tablet (5 mg total) by mouth 2 (two) times daily. 02/24/24   Hassan Links, MD  Ascorbic Acid (VITAMIN C) 1000 MG tablet Take 2,000 mg by mouth daily.    [provider]  atorvastatin  (LIPITOR ) 80 MG tablet Take 1 tablet (80 mg total) by mouth every evening. 02/06/21   Hassan Links, MD  BREO ELLIPTA  200-25 MCG/ACT AEPB Inhale 1 puff into the lungs daily. 12/13/21   [provider]  carvedilol  (COREG ) 12.5 MG  tablet Take 1.5 tablets (18.75 mg total) by mouth 2 (two) times daily with a meal. 02/06/21   Munley, Margart Shears, MD  cholecalciferol (VITAMIN D3) 25 MCG (1000 UNIT) tablet Take 1,000 Units by mouth daily.    [provider]  clopidogrel  (PLAVIX ) 75 MG tablet Take 1 tablet (75 mg total) by mouth daily with breakfast. 02/06/21   Hassan Links, MD  Cyanocobalamin (B-12 PO) Take 1 tablet by mouth daily.     [provider]  montelukast  (SINGULAIR ) 10 MG tablet Take 10 mg by mouth daily. 04/04/15   [provider]  pantoprazole  (PROTONIX ) 40 MG tablet Take 1 tablet (40 mg total) by mouth daily. 10/25/23   Hassan Links, MD  tamsulosin  (FLOMAX ) 0.4 MG CAPS capsule Take 1 capsule (0.4 mg total) by mouth daily. 04/08/22   Harris, Abigail, PA-C  valsartan  (DIOVAN ) 80 MG tablet Take 1 tablet (80 mg total) by mouth daily. 02/06/21   Hassan Links, MD    Family  History Family History  Problem Relation Age of Onset   Hyperlipidemia Mother    Hypertension Mother    Diabetes Mother    Heart attack Maternal Uncle    Heart attack Maternal Grandmother     Social History Social History   Tobacco Use   Smoking status: Former    Current packs/day: 0.00    Average packs/day: 1 pack/day for 15.0 years (15.0 ttl pk-yrs)    Types: Cigarettes    Start date: 11/19/1961    Quit date: 11/19/1976    Years since quitting: 47.3   Smokeless tobacco: Former    Types: Chew    Quit date: 1980  Vaping Use   Vaping status: Never Used  Substance Use Topics   Alcohol use: No   Drug use: No     Allergies   Pneumovax 23 [pneumococcal vac polyvalent]   Review of Systems Review of Systems  Respiratory:  Positive for cough.      Physical Exam Triage Vital Signs ED Triage Vitals  Encounter Vitals Group     BP 04/01/24 1120 133/84     Systolic BP Percentile --      Diastolic BP Percentile --      Pulse Rate 04/01/24 1120 76     Resp 04/01/24 1120 20     Temp 04/01/24 1120 98.6 F (37 C)     Temp Source 04/01/24 1120 Oral     SpO2 04/01/24 1120 96 %     Weight --      Height --      Head Circumference --      Peak Flow --      Pain Score 04/01/24 1122 0     Pain Loc --      Pain Education --      Exclude from Growth Chart --    No data found.  Updated Vital Signs BP 133/84 (BP Location: Right Arm)   Pulse 76   Temp 98.6 F (37 C) (Oral)   Resp 20   SpO2 96%   Visual Acuity Right Eye Distance:   Left Eye Distance:   Bilateral Distance:    Right Eye Near:   Left Eye Near:    Bilateral Near:     Physical Exam Constitutional:      General: He is not in acute distress.    Appearance: Normal appearance. He is not ill-appearing, toxic-appearing or diaphoretic.  HENT:     Right Ear: Tympanic membrane, ear  canal and external ear normal.     Left Ear: Tympanic membrane, ear canal and external ear normal.     Mouth/Throat:      Pharynx: Oropharynx is clear.  Eyes:     Conjunctiva/sclera: Conjunctivae normal.  Cardiovascular:     Rate and Rhythm: Normal rate and regular rhythm.     Pulses: Normal pulses.     Heart sounds: Normal heart sounds.  Pulmonary:     Effort: Pulmonary effort is normal.     Breath sounds: Decreased air movement present. Wheezing present.  Musculoskeletal:        General: Normal range of motion.  Skin:    General: Skin is warm and dry.  Neurological:     Mental Status: He is alert.  Psychiatric:        Mood and Affect: Mood normal.      UC Treatments / Results  Labs (all labs ordered are listed, but only abnormal results are displayed) Labs Reviewed - No data to display  EKG   Radiology No results found.  Procedures Procedures (including critical care time)  Medications Ordered in UC Medications  triamcinolone  acetonide (KENALOG -40) injection 40 mg (40 mg Intramuscular Given 04/01/24 1135)    Initial Impression / Assessment and Plan / UC Course  I have reviewed the triage vital signs and the nursing notes.  Pertinent labs & imaging results that were available during my care of the patient were reviewed by me and considered in my medical decision making (see chart for details).     Acute cough with wheezing-most likely allergy induced asthma flare.  Recommend Zyrtec over-the-counter daily.  Kenalog  injection given here for lung inflammation and wheezing.Continue steroid inhalers as prescribed.  Follow up as needed  Final Clinical Impressions(s) / UC Diagnoses   Final diagnoses:  Acute cough   Discharge Instructions      Kenalog  injection given here for lung inflammation.  Recommend Zyrtec OTC daily.  Follow up for any continued issues   ED Prescriptions   None    PDMP not reviewed this encounter.   Landa Pine, FNP 04/01/24 [phone]

## 2024-04-01 NOTE — Discharge Instructions (Signed)
 Kenalog  injection given here for lung inflammation.  Recommend Zyrtec OTC daily.  Follow up for any continued issues

## 2024-04-30 ENCOUNTER — Ambulatory Visit
Admission: RE | Admit: 2024-04-30 | Discharge: 2024-04-30 | Disposition: A | Discharge status: Home / Self Care | Payer: Self-pay | Source: Ambulatory Visit | Attending: Radiation Oncology | Admitting: Radiation Oncology

## 2024-04-30 VITALS — BP 134/99 | HR 60 | Temp 97.6°F | Resp 18 | Ht 74.0 in | Wt 192.0 lb

## 2024-04-30 DIAGNOSIS — Z7951 Long term (current) use of inhaled steroids: Secondary | ICD-10-CM | POA: Diagnosis not present

## 2024-04-30 DIAGNOSIS — Z7902 Long term (current) use of antithrombotics/antiplatelets: Secondary | ICD-10-CM | POA: Insufficient documentation

## 2024-04-30 DIAGNOSIS — Z79899 Other long term (current) drug therapy: Secondary | ICD-10-CM | POA: Insufficient documentation

## 2024-04-30 DIAGNOSIS — Z7901 Long term (current) use of anticoagulants: Secondary | ICD-10-CM | POA: Insufficient documentation

## 2024-04-30 DIAGNOSIS — Z923 Personal history of irradiation: Secondary | ICD-10-CM | POA: Diagnosis not present

## 2024-04-30 DIAGNOSIS — C61 Malignant neoplasm of prostate: Secondary | ICD-10-CM | POA: Diagnosis present

## 2024-05-01 NOTE — Progress Notes (Signed)
 Department of Radiation Oncology    Followup Note    Name: Derek Hart Date: 04/30/2024 MRN: [id number] DOB: [birth year]   Diagnosis:     ICD-10-CM   1. Malignant neoplasm of prostate (HCC)  C61         MEDICATIONS: Current Outpatient Medications  Medication Sig Dispense Refill   apixaban  (ELIQUIS ) 5 MG TABS tablet Take 1 tablet (5 mg total) by mouth 2 (two) times daily. 180 tablet 1   Ascorbic Acid (VITAMIN C) 1000 MG tablet Take 2,000 mg by mouth daily.     atorvastatin  (LIPITOR ) 80 MG tablet Take 1 tablet (80 mg total) by mouth every evening. 90 tablet 3   BREO ELLIPTA  200-25 MCG/ACT AEPB Inhale 1 puff into the lungs daily.     carvedilol  (COREG ) 12.5 MG tablet Take 1.5 tablets (18.75 mg total) by mouth 2 (two) times daily with a meal. 270 tablet 3   cholecalciferol (VITAMIN D3) 25 MCG (1000 UNIT) tablet Take 1,000 Units by mouth daily.     clopidogrel  (PLAVIX ) 75 MG tablet Take 1 tablet (75 mg total) by mouth daily with breakfast. 90 tablet 3   Cyanocobalamin (B-12 PO) Take 1 tablet by mouth daily.      montelukast  (SINGULAIR ) 10 MG tablet Take 10 mg by mouth daily.  2   pantoprazole  (PROTONIX ) 40 MG tablet Take 1 tablet (40 mg total) by mouth daily. 90 tablet 3   tamsulosin  (FLOMAX ) 0.4 MG CAPS capsule Take 1 capsule (0.4 mg total) by mouth daily. 30 capsule 1   valsartan  (DIOVAN ) 80 MG tablet Take 1 tablet (80 mg total) by mouth daily. 90 tablet 3   No current facility-administered medications for this encounter.     ALLERGIES: Pneumovax 23 [pneumococcal vac polyvalent]   LABORATORY DATA:  Lab Results  Component Value Date   WBC 10.2 06/27/2023   HGB 13.5 06/27/2023   HCT 39.9 06/27/2023   MCV 90.1 06/27/2023   PLT 162 06/27/2023   Lab Results  Component Value Date   NA 144 08/19/2023   K 4.4 08/19/2023   CL 107 (H) 08/19/2023   CO2 23 08/19/2023   Lab Results  Component Value Date   ALT 24 10/25/2020   AST 20 10/25/2020   ALKPHOS 97 10/25/2020    BILITOT 0.7 10/25/2020     NARRATIVE: Derek Hart was seen today in followup.  He completed external beam radiation approximately 6 weeks ago.  He states that he is doing well overall.  His energy level is good.  He gets up typically twice at night to urinate.  He reports no burning with urination, or urgency.   PHYSICAL EXAMINATION: height is 6' 2 (1.88 m) and weight is 192 lb (87.1 kg). His oral temperature is 97.6 F (36.4 C). His blood pressure is 134/99 (abnormal) and his pulse is 60. His respiration is 18 and oxygen saturation is 98%.        ASSESSMENT: The patient is doing well approximately 6 weeks out from completion of external beam radiation for his adenocarcinoma of the prostate.  He reports that he does not have any follow-up scheduled with his urologist, Dr. Dulcy Gibney.  I spoke with him about the need to have routine PSAs obtained; he states that he wishes to have this done at his primary care provider's office (Dr. Eluterio Hamburg.) he states that he sees Dr. Eluterio Hamburg every 4 to 6 months, typically with lab work being obtained with each visit.  PLAN: The patient will follow  up on an as needed basis.  I encouraged him to contact me at anytime with any questions or concerns he may have.      Grete Bosko A. Cathren Coaster, MD

## 2024-05-01 NOTE — Progress Notes (Signed)
 Remote pacemaker transmission.

## 2024-06-10 ENCOUNTER — Ambulatory Visit: Admitting: Cardiology

## 2024-06-15 ENCOUNTER — Ambulatory Visit: Attending: Cardiology | Admitting: Cardiology

## 2024-06-15 VITALS — BP 144/100 | HR 61 | Ht 72.0 in | Wt 193.0 lb

## 2024-06-15 DIAGNOSIS — E782 Mixed hyperlipidemia: Secondary | ICD-10-CM

## 2024-06-15 DIAGNOSIS — I10 Essential (primary) hypertension: Secondary | ICD-10-CM

## 2024-06-15 DIAGNOSIS — I255 Ischemic cardiomyopathy: Secondary | ICD-10-CM

## 2024-06-15 DIAGNOSIS — I5022 Chronic systolic (congestive) heart failure: Secondary | ICD-10-CM | POA: Diagnosis not present

## 2024-06-15 DIAGNOSIS — R0609 Other forms of dyspnea: Secondary | ICD-10-CM

## 2024-06-15 DIAGNOSIS — I2510 Atherosclerotic heart disease of native coronary artery without angina pectoris: Secondary | ICD-10-CM | POA: Diagnosis not present

## 2024-06-15 NOTE — Patient Instructions (Signed)

## 2024-06-15 NOTE — Progress Notes (Unsigned)
 Cardiology Office Note:    Date:  06/15/2024   ID:  Derek Hart, DOB [birth year], MRN [id number]  PCP:  Thurmond Cathlyn LABOR., MD  Cardiologist:  Derek Fitch, MD    Referring MD: Thurmond Cathlyn LABOR., MD   Chief Complaint  Patient presents with   Follow-up    History of Present Illness:    Derek Hart is a 73 y.o. 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  Comes today to my office for follow-up.  Overall doing well.  Denies having chest pain tightness squeezing pressure burning chest.  Does what he wants to do with no difficulties.  Past Medical History:  Diagnosis Date   AKI (acute kidney injury) (HCC) 06/25/2023   Allergic rhinitis 07/19/2016   Anemia, chronic disease 03/09/2021   Asthma 07/19/2016   Atherosclerotic heart disease of native coronary artery without angina pectoris 07/28/2015    CAD/ MI/ CABG  Overview:  [place].  MI LAD stent. 01/2017 Overview:  He had MI with staged PCI of LAD and later RCA and LCF 2015, EF 40% Overview:  Added automatically from request for surgery [id number]   Bradycardia 06/24/2023   Cardiomyopathy (HCC) 07/19/2016   Overview:  EF 45% or 54% 01/2017   Chest pain 10/25/2020   Chest pain in adult 02/01/2017   Chronic anticoagulation 10/21/2017   Chronic bronchitis (HCC)    Chronic systolic heart failure (HCC) 06/24/2023   COPD (chronic obstructive pulmonary disease) (HCC) 04/26/2015   PFT-06/27/2015-minimal obstructive airways disease with insignificant response to bronchodilator, minimal diffusion defect, normal lung volumes. FEV1/FVC 0.76, DLCO 72%   Coronary artery disease    Coronary artery disease involving native coronary artery of native heart with angina pectoris (HCC) 07/28/2015   CAD/ MI/ CABG     Overview:   [place].  MI LAD  stent. 01/2017  Overview:   He had MI with staged PCI of LAD and later RCA and LCF 2015, EF 40%  Overview:   Added automatically from request for surgery [id number]     Dyspnea on exertion 04/26/2015   Elevated alkaline phosphatase level 04/06/2021   Formatting of this note might be different from the original.  Isoenzymes look more liver. US  ordered 04/06/2021     Elevated PSA 09/28/2021   Emphysema lung (HCC)    treated for asthma but later found out it was emphysema (10/15/2017)   Essential hypertension 07/19/2016   Fibrosis of liver 09/26/2021   Formatting of this note might be different from the original.  Neg work up.  No bx because of blood thinners.     GERD (gastroesophageal reflux disease)    Hematuria 08/15/2018   High risk medication use 07/19/2016   Hyperlipidemia    left lung   Inguinal hernia 07/19/2016   Overview:  Mild bilateral.   Ischemic cardiomyopathy 05/30/2017   Lung nodule seen on imaging study 07/28/2015   chest x-ray report from 03/05/2014 at [facility] described a vague nodular density in the left upper lobe and recommended follow-up CT scan which was never done.    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   Second degree heart block 06/24/2023   Stage 3 chronic kidney disease (HCC) 07/19/2016   STEMI (ST elevation myocardial infarction) [facility]) 10/18/2017   Feb 2015    Past Surgical History:  Procedure Laterality Date   CARDIAC CATHETERIZATION  11/2016   CATARACT EXTRACTION W/ INTRAOCULAR LENS  IMPLANT, BILATERAL Bilateral    CORONARY ANGIOPLASTY WITH STENT PLACEMENT  12/2013-02/2014   2 + 3 stents   CORONARY ANGIOPLASTY WITH STENT PLACEMENT  01/2017   distal LAD   CORONARY STENT  INTERVENTION N/A 10/25/2020   Procedure: CORONARY STENT INTERVENTION;  Surgeon: Swaziland, Peter M, MD;  Location: [facility] INVASIVE CV LAB;  Service: Cardiovascular;  Laterality: N/A;   CORONARY ULTRASOUND/IVUS N/A 10/25/2020   Procedure: Intravascular Ultrasound/IVUS;  Surgeon: Swaziland, Peter M, MD;  Location: [facility] INVASIVE CV LAB;  Service: Cardiovascular;  Laterality: N/A;   KNEE ARTHROSCOPY Right 1982   LEFT HEART CATH AND CORONARY ANGIOGRAPHY N/A 10/25/2020   Procedure: LEFT HEART CATH AND CORONARY ANGIOGRAPHY;  Surgeon: Swaziland, Peter M, MD;  Location: [facility] INVASIVE CV LAB;  Service: Cardiovascular;  Laterality: N/A;   PACEMAKER IMPLANT N/A 06/26/2023   Procedure: PACEMAKER IMPLANT;  Surgeon: Inocencio Soyla Lunger, MD;  Location: [facility] INVASIVE CV LAB;  Service: Cardiovascular;  Laterality: N/A;   PROSTATE BIOPSY      Current Medications: Current Meds  Medication Sig   apixaban  (ELIQUIS ) 5 MG TABS tablet Take 1 tablet (5 mg total) by mouth 2 (two) times daily.   Ascorbic Acid (VITAMIN C) 1000 MG tablet Take 2,000 mg by mouth daily.   atorvastatin  (LIPITOR ) 80 MG tablet Take 1 tablet (80 mg total) by mouth every evening.   BREO ELLIPTA  200-25 MCG/ACT AEPB Inhale 1 puff into the lungs daily.   carvedilol  (COREG ) 12.5 MG tablet Take 1.5 tablets (18.75 mg total) by mouth 2 (two) times daily with a meal.   cholecalciferol (VITAMIN D3) 25 MCG (1000 UNIT) tablet Take 1,000 Units by mouth daily.   clopidogrel  (PLAVIX ) 75 MG tablet Take 1 tablet (75 mg total) by mouth daily with breakfast.   Cyanocobalamin (B-12 PO) Take 1 tablet by mouth daily.    montelukast  (SINGULAIR ) 10 MG tablet Take 10 mg by mouth daily.   pantoprazole  (PROTONIX ) 40 MG tablet Take 1 tablet (40 mg total) by mouth daily.   tamsulosin  (FLOMAX ) 0.4 MG CAPS capsule Take 1 capsule (0.4 mg total) by mouth daily.   valsartan  (DIOVAN ) 80 MG tablet Take 1 tablet (80 mg total) by mouth daily.     Allergies:   Pneumovax 23 [pneumococcal vac  polyvalent]   Social History   Socioeconomic History   Marital status: Married    Spouse name: Not on file   Number of children: 2   Years of education: 12   Highest education level: Not on file  Occupational History   Not on file  Tobacco Use   Smoking status: Former    Current packs/day: 0.00    Average packs/day: 1 pack/day for 15.0 years (15.0 ttl pk-yrs)    Types: Cigarettes    Start date: 11/19/1961    Quit date: 11/19/1976    Years since quitting: 47.6   Smokeless tobacco: Former    Types: Chew    Quit date: 1980  Vaping Use   Vaping status: Never Used  Substance and Sexual Activity   Alcohol use: No   Drug use: No   Sexual activity: Not Currently  Other Topics Concern   Not on file  Social History Narrative   Not on file   Social Drivers of Health   Financial Resource Strain: Not on file  Food Insecurity: No Food Insecurity (02/03/2024)   Hunger Vital Sign    Worried About Running Out of Food in the Last Year: Never true    Ran Out of Food in the Last Year: Never true  Transportation Needs: No Transportation Needs (02/03/2024)   PRAPARE - Administrator, Civil Service (Medical): No    Lack of Transportation (Non-Medical): No  Physical Activity: Not on file  Stress: Not on file  Social Connections: Not on file     Family History: The patient's family history includes Diabetes in his mother; Heart attack in his maternal grandmother and maternal uncle; Hyperlipidemia in his mother; Hypertension in his mother. ROS:   Please see the history of present illness.    All 14 point review of systems negative except as described per history of present illness  EKGs/Labs/Other Studies Reviewed:    EKG Interpretation Date/Time:  Monday June 15 2024 08:16:25 EDT Ventricular Rate:  61 PR Interval:  238 QRS Duration:  150 QT Interval:  454 QTC Calculation: 457 R Axis:   -14  Text Interpretation: Atrial-sensed ventricular-paced rhythm with prolonged AV  conduction Abnormal ECG When compared with ECG of 30-Sep-2023 11:05, Electronic ventricular pacemaker has replaced Electronic atrial pacemaker Confirmed by Bernie Charleston [phone]) on 06/15/2024 8:27:09 AM    Recent Labs: 06/24/2023: B Natriuretic Peptide 150.0 06/25/2023: TSH 0.799 06/26/2023: Magnesium 1.7 06/27/2023: Hemoglobin 13.5; Platelets 162 08/19/2023: BUN 21; Creatinine, Ser 1.28; Potassium 4.4; Sodium 144  Recent Lipid Panel    Component Value Date/Time   CHOL 98 (L) 01/26/2021 1138   TRIG 70 01/26/2021 1138   HDL 39 (L) 01/26/2021 1138   CHOLHDL 2.5 01/26/2021 1138   LDLCALC 44 01/26/2021 1138    Physical Exam:    VS:  BP (!) 144/100 (BP Location: Left Arm, Patient Position: Sitting)   Pulse 61   Ht 6' (1.829 m)   Wt 193 lb (87.5 kg)   SpO2 97%   BMI 26.18 kg/m     Wt Readings from Last 3 Encounters:  06/15/24 193 lb (87.5 kg)  04/30/24 192 lb (87.1 kg)  02/03/24 195 lb 6.4 oz (88.6 kg)     GEN:  Well nourished, well developed in no acute distress HEENT: Normal NECK: No JVD; No carotid bruits LYMPHATICS: No lymphadenopathy CARDIAC: RRR, no murmurs, no rubs, no gallops RESPIRATORY:  Clear to auscultation without rales, wheezing or rhonchi  ABDOMEN: Soft, non-tender, non-distended MUSCULOSKELETAL:  No edema; No deformity  SKIN: Warm and dry LOWER EXTREMITIES: no swelling NEUROLOGIC:  Alert and oriented x 3 PSYCHIATRIC:  Normal affect   ASSESSMENT:    1. Essential hypertension   2. Atherosclerosis of native coronary artery of native heart without angina pectoris   3. Chronic systolic heart failure (HCC)   4. Ischemic cardiomyopathy   5. Mixed hyperlipidemia    PLAN:    In order of problems listed above:  History of cardiomyopathy ischemic.  Will recheck left ventricle ejection fraction the meantime continue guideline directed medical therapy. Dyslipidemia I do not see any recent fasting lipid profile last one I have is from 2022 with LDL 44 HDL 39,  however I was able to retrieve information from his primary care physician blood test done in May 12, 2024 show LDL of 90 HDL 45, he is taking Lipitor  80.  Will add Zetia to his  medical regimen. Pacemaker present, it is a Archivist, normal function.   Medication Adjustments/Labs and Tests Ordered: Current medicines are reviewed at length with the patient today.  Concerns regarding medicines are outlined above.  Orders Placed This Encounter  Procedures   EKG 12-Lead   Medication changes: No orders of the defined types were placed in this encounter.   Signed, Derek DOROTHA Fitch, MD, [facility] 06/15/2024 8:39 AM    [facility]

## 2024-06-26 ENCOUNTER — Ambulatory Visit: Payer: Medicare PPO

## 2024-06-26 DIAGNOSIS — R55 Syncope and collapse: Secondary | ICD-10-CM

## 2024-06-26 LAB — CUP PACEART REMOTE DEVICE CHECK
Battery Remaining Longevity: 114 mo
Battery Remaining Percentage: 93 %
Battery Voltage: 2.99 V
Brady Statistic AP VP Percent: 17 %
Brady Statistic AP VS Percent: 5.3 %
Brady Statistic AS VP Percent: 66 %
Brady Statistic AS VS Percent: 11 %
Brady Statistic RA Percent Paced: 21 %
Brady Statistic RV Percent Paced: 83 %
Date Time Interrogation Session: 20250808040014
Implantable Lead Connection Status: 753985
Implantable Lead Connection Status: 753985
Implantable Lead Implant Date: 20240807
Implantable Lead Implant Date: 20240807
Implantable Lead Location: 753859
Implantable Lead Location: 753860
Implantable Pulse Generator Implant Date: 20240807
Lead Channel Impedance Value: 450 Ohm
Lead Channel Impedance Value: 480 Ohm
Lead Channel Pacing Threshold Amplitude: 0.5 V
Lead Channel Pacing Threshold Amplitude: 0.875 V
Lead Channel Pacing Threshold Pulse Width: 0.5 ms
Lead Channel Pacing Threshold Pulse Width: 0.5 ms
Lead Channel Sensing Intrinsic Amplitude: 5 mV
Lead Channel Sensing Intrinsic Amplitude: 5.5 mV
Lead Channel Setting Pacing Amplitude: 1.125
Lead Channel Setting Pacing Amplitude: 1.5 V
Lead Channel Setting Pacing Pulse Width: 0.5 ms
Lead Channel Setting Sensing Sensitivity: 2 mV
Pulse Gen Model: 2272
Pulse Gen Serial Number: 5819145

## 2024-06-29 ENCOUNTER — Ambulatory Visit: Payer: Self-pay | Admitting: Cardiology

## 2024-07-02 ENCOUNTER — Ambulatory Visit: Attending: Cardiology

## 2024-07-02 DIAGNOSIS — R0609 Other forms of dyspnea: Secondary | ICD-10-CM

## 2024-07-02 LAB — ECHOCARDIOGRAM COMPLETE
Area-P 1/2: 2.87 cm2
MV M vel: 4.34 m/s
MV Peak grad: 75.2 mmHg
S' Lateral: 4.1 cm

## 2024-07-06 ENCOUNTER — Ambulatory Visit: Payer: Self-pay | Admitting: Cardiology

## 2024-07-08 ENCOUNTER — Telehealth: Payer: Self-pay

## 2024-07-08 DIAGNOSIS — E785 Hyperlipidemia, unspecified: Secondary | ICD-10-CM

## 2024-07-08 MED ORDER — EZETIMIBE 10 MG PO TABS: 10.0000 mg | ORAL_TABLET | Freq: Every day | ORAL | 3 refills | Status: AC

## 2024-07-08 NOTE — Telephone Encounter (Signed)
 LVM per DPR- per Dr. Karry note regarding Echo results and adding Zetia  10mg  daily and Lipid, AST, ALT in 6 weeks. Encouraged to call with any questions. Routed to PCP

## 2024-08-14 NOTE — Progress Notes (Signed)
 Remote PPM Transmission

## 2024-08-20 LAB — LIPID PANEL
Chol/HDL Ratio: 2.8 ratio (ref 0.0–5.0)
Cholesterol, Total: 136 mg/dL (ref 100–199)
HDL: 49 mg/dL (ref >39)
LDL Chol Calc (NIH): 74 mg/dL (ref 0–99)
Triglycerides: 65 mg/dL (ref 0–149)
VLDL Cholesterol Cal: 13 mg/dL (ref 5–40)

## 2024-08-20 LAB — ALT: ALT: 21 IU/L (ref 0–44)

## 2024-08-20 LAB — AST: AST: 20 IU/L (ref 0–40)

## 2024-09-02 ENCOUNTER — Ambulatory Visit: Payer: Self-pay | Admitting: Cardiology

## 2024-09-08 ENCOUNTER — Other Ambulatory Visit: Payer: Self-pay | Admitting: Cardiology

## 2024-09-08 DIAGNOSIS — I2693 Single subsegmental pulmonary embolism without acute cor pulmonale: Secondary | ICD-10-CM

## 2024-09-08 NOTE — Telephone Encounter (Signed)
 Prescription refill request for Eliquis  received. Indication:hb Last office visit:7/25 Scr:1.20  6/25 Age: 73 Weight:87.5  kg  Prescription refilled

## 2024-09-14 ENCOUNTER — Telehealth: Payer: Self-pay

## 2024-09-14 NOTE — Telephone Encounter (Signed)
 Lab Results reviewed with pt as per Dr. Karry note.  Pt verbalized understanding and had no additional questions. Routed to PCP

## 2024-09-25 ENCOUNTER — Ambulatory Visit: Payer: Medicare PPO

## 2024-09-25 DIAGNOSIS — R55 Syncope and collapse: Secondary | ICD-10-CM

## 2024-09-27 LAB — CUP PACEART REMOTE DEVICE CHECK
Battery Remaining Longevity: 111 mo
Battery Remaining Percentage: 91 %
Battery Voltage: 2.99 V
Brady Statistic AP VP Percent: 19 %
Brady Statistic AP VS Percent: 5.7 %
Brady Statistic AS VP Percent: 66 %
Brady Statistic AS VS Percent: 9.3 %
Brady Statistic RA Percent Paced: 23 %
Brady Statistic RV Percent Paced: 84 %
Date Time Interrogation Session: 20251107030013
Implantable Lead Connection Status: 753985
Implantable Lead Connection Status: 753985
Implantable Lead Implant Date: 20240807
Implantable Lead Implant Date: 20240807
Implantable Lead Location: 753859
Implantable Lead Location: 753860
Implantable Pulse Generator Implant Date: 20240807
Lead Channel Impedance Value: 480 Ohm
Lead Channel Impedance Value: 490 Ohm
Lead Channel Pacing Threshold Amplitude: 0.625 V
Lead Channel Pacing Threshold Amplitude: 0.875 V
Lead Channel Pacing Threshold Pulse Width: 0.5 ms
Lead Channel Pacing Threshold Pulse Width: 0.5 ms
Lead Channel Sensing Intrinsic Amplitude: 5 mV
Lead Channel Sensing Intrinsic Amplitude: 5.2 mV
Lead Channel Setting Pacing Amplitude: 1.125
Lead Channel Setting Pacing Amplitude: 1.625
Lead Channel Setting Pacing Pulse Width: 0.5 ms
Lead Channel Setting Sensing Sensitivity: 2 mV
Pulse Gen Model: 2272
Pulse Gen Serial Number: 5819145

## 2024-09-29 ENCOUNTER — Ambulatory Visit: Payer: Self-pay | Admitting: Cardiology

## 2024-09-29 NOTE — Progress Notes (Signed)
 Remote PPM Transmission

## 2024-10-06 ENCOUNTER — Other Ambulatory Visit: Payer: Self-pay

## 2024-10-06 MED ORDER — PANTOPRAZOLE SODIUM 40 MG PO TBEC: 40.0000 mg | DELAYED_RELEASE_TABLET | Freq: Every day | ORAL | 3 refills | Status: AC

## 2024-10-12 ENCOUNTER — Encounter (HOSPITAL_BASED_OUTPATIENT_CLINIC_OR_DEPARTMENT_OTHER): Payer: Self-pay

## 2024-10-12 ENCOUNTER — Ambulatory Visit (HOSPITAL_BASED_OUTPATIENT_CLINIC_OR_DEPARTMENT_OTHER)
Admission: RE | Admit: 2024-10-12 | Discharge: 2024-10-12 | Disposition: A | Discharge status: Home / Self Care | Payer: Self-pay | Source: Ambulatory Visit | Attending: Family Medicine | Admitting: Family Medicine

## 2024-10-12 ENCOUNTER — Other Ambulatory Visit (HOSPITAL_BASED_OUTPATIENT_CLINIC_OR_DEPARTMENT_OTHER): Payer: Self-pay

## 2024-10-12 VITALS — BP 121/74 | HR 78 | Temp 98.0°F | Resp 18

## 2024-10-12 DIAGNOSIS — R0602 Shortness of breath: Secondary | ICD-10-CM

## 2024-10-12 DIAGNOSIS — J441 Chronic obstructive pulmonary disease with (acute) exacerbation: Secondary | ICD-10-CM

## 2024-10-12 DIAGNOSIS — R051 Acute cough: Secondary | ICD-10-CM | POA: Diagnosis not present

## 2024-10-12 MED ORDER — PROMETHAZINE-DM 6.25-15 MG/5ML PO SYRP
5.0000 mL | ORAL_SOLUTION | Freq: Four times a day (QID) | ORAL | 0 refills | Status: AC | PRN
  Filled 2024-10-12: qty 118, 6d supply, fill #0

## 2024-10-12 MED ORDER — METHYLPREDNISOLONE ACETATE 80 MG/ML IJ SUSP
80.0000 mg | Freq: Once | INTRAMUSCULAR | Status: AC
  Administered 2024-10-12: 80 mg via INTRAMUSCULAR

## 2024-10-12 MED ORDER — ALBUTEROL SULFATE HFA 108 (90 BASE) MCG/ACT IN AERS
2.0000 | INHALATION_SPRAY | RESPIRATORY_TRACT | 0 refills | Status: AC | PRN
  Filled 2024-10-12: qty 6.7, 25d supply, fill #0

## 2024-10-12 NOTE — ED Triage Notes (Signed)
 Pt reports wheezing at nighttime mainly for a couple weeks. Pt reports he usually is prescribed prednisone and he gets a steroid shot.

## 2024-10-12 NOTE — ED Provider Notes (Signed)
 [facility]    CSN: [id number] Arrival date & time: 10/12/24  1339      History   Chief Complaint Chief Complaint  Patient presents with   Wheezing    Nighttime wheezing.  Usually need steroid shot about every 6 months - Entered by patient    HPI Derek Hart is a 73 y.o. male.   73 year old male with COPD who reports she is having an exacerbation.  He reports about every 6 months he has an exacerbation and has responded well to Kenalog  injections with some oral prednisone.  He has been wheezing at night since approximately 09/19/2024 or earlier.  He has inhalers that he is using but they are not preventing him from being short of breath and now he starting to wheeze during the day.  He denies any fever, colored sputum, nausea, vomiting, constipation, diarrhea.   Wheezing Associated symptoms: chest tightness, cough and shortness of breath   Associated symptoms: no chest pain, no ear pain, no fever, no rash and no sore throat     Past Medical History:  Diagnosis Date   AKI (acute kidney injury) 06/25/2023   Allergic rhinitis 07/19/2016   Anemia, chronic disease 03/09/2021   Asthma 07/19/2016   Atherosclerotic heart disease of native coronary artery without angina pectoris 07/28/2015    CAD/ MI/ CABG  Overview:  [place].  MI LAD stent. 01/2017 Overview:  He had MI with staged PCI of LAD and later RCA and LCF 2015, EF 40% Overview:  Added automatically from request for surgery [id number]   Bradycardia 06/24/2023   Cardiomyopathy (HCC) 07/19/2016   Overview:  EF 45% or 54% 01/2017   Chest pain 10/25/2020   Chest pain in adult 02/01/2017   Chronic anticoagulation 10/21/2017   Chronic bronchitis (HCC)    Chronic systolic heart failure (HCC) 06/24/2023   COPD (chronic obstructive pulmonary disease) (HCC) 04/26/2015   PFT-06/27/2015-minimal obstructive airways disease with insignificant response to bronchodilator, minimal diffusion defect, normal lung volumes. FEV1/FVC  0.76, DLCO 72%   Coronary artery disease    Coronary artery disease involving native coronary artery of native heart with angina pectoris 07/28/2015   CAD/ MI/ CABG     Overview:   [place].  MI LAD stent. 01/2017  Overview:   He had MI with staged PCI of LAD and later RCA and LCF 2015, EF 40%  Overview:   Added automatically from request for surgery [id number]     Dyspnea on exertion 04/26/2015   Elevated alkaline phosphatase level 04/06/2021   Formatting of this note might be different from the original.  Isoenzymes look more liver. US  ordered 04/06/2021     Elevated PSA 09/28/2021   Emphysema lung (HCC)    treated for asthma but later found out it was emphysema (10/15/2017)   Essential hypertension 07/19/2016   Fibrosis of liver 09/26/2021   Formatting of this note might be different from the original.  Neg work up.  No bx because of blood thinners.     GERD (gastroesophageal reflux disease)    Hematuria 08/15/2018   High risk medication use 07/19/2016   Hyperlipidemia    left lung   Inguinal hernia 07/19/2016   Overview:  Mild bilateral.   Ischemic cardiomyopathy 05/30/2017   Lung nodule seen on imaging study 07/28/2015   chest x-ray report from 03/05/2014 at [facility] described a vague nodular density in the left upper lobe and recommended follow-up CT scan which was never done.    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   Second degree heart block 06/24/2023   Stage 3 chronic kidney disease (HCC) 07/19/2016   STEMI (ST elevation myocardial infarction) [facility]) 10/18/2017   Feb 2015    Patient Active Problem List   Diagnosis Date Noted   Malignant neoplasm of prostate (HCC) 02/03/2024   AKI (acute kidney injury)  06/25/2023   Second degree heart block 06/24/2023   Bradycardia 06/24/2023   Chronic systolic heart failure (HCC) 06/24/2023   Elevated PSA 09/28/2021   Fibrosis of liver 09/26/2021   Elevated alkaline phosphatase level 04/06/2021   Anemia, chronic disease 03/09/2021   MI (myocardial infarction) (HCC)    Hyperlipidemia    GERD (gastroesophageal reflux disease)    Emphysema lung (HCC)    Coronary artery disease    Chronic bronchitis (HCC)    Chest pain 10/25/2020   NSTEMI (non-ST elevated myocardial infarction) (HCC) 10/25/2020   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   Hematuria 08/15/2018   Chronic anticoagulation 10/21/2017   STEMI (ST elevation myocardial infarction) (HCC) 10/18/2017   Pulmonary embolism (HCC) 10/15/2017   Ischemic cardiomyopathy 05/30/2017   Old MI (myocardial infarction) 05/30/2017   Chest pain in adult 02/01/2017   Allergic rhinitis 07/19/2016   Asthma 07/19/2016   Essential hypertension 07/19/2016   High risk medication use 07/19/2016   Inguinal hernia 07/19/2016   Mixed hyperlipidemia 07/19/2016   Prediabetes 07/19/2016   Stage 3 chronic kidney disease (HCC) 07/19/2016   Cardiomyopathy (HCC) 07/19/2016   Lung nodule seen on imaging study 07/28/2015   Coronary artery disease involving native coronary artery of native heart with angina pectoris 07/28/2015   Atherosclerotic heart disease of native coronary artery without angina pectoris 07/28/2015   Dyspnea on exertion 04/26/2015   COPD (chronic obstructive pulmonary disease) (HCC) 04/26/2015    Past Surgical History:  Procedure Laterality Date   CARDIAC CATHETERIZATION  11/2016   CATARACT EXTRACTION W/ INTRAOCULAR LENS  IMPLANT, BILATERAL Bilateral    CORONARY ANGIOPLASTY WITH STENT PLACEMENT  12/2013-02/2014   2 + 3 stents   CORONARY ANGIOPLASTY WITH STENT PLACEMENT  01/2017   distal LAD   CORONARY STENT INTERVENTION N/A 10/25/2020   Procedure: CORONARY STENT INTERVENTION;  Surgeon:  Jordan, Peter M, MD;  Location: [facility] INVASIVE CV LAB;  Service: Cardiovascular;  Laterality: N/A;   CORONARY ULTRASOUND/IVUS N/A 10/25/2020   Procedure: Intravascular Ultrasound/IVUS;  Surgeon: Jordan, Peter M, MD;  Location[facility] INVASIVE CV LAB;  Service: Cardiovascular;  Laterality: N/A;   KNEE ARTHROSCOPY Right 1982   LEFT HEART CATH AND CORONARY ANGIOGRAPHY N/A 10/25/2020   Procedure: LEFT HEART CATH AND CORONARY ANGIOGRAPHY;  Surgeon: Jordan, Peter M, MD;  Location: [facility] INVASIVE CV LAB;  Service: Cardiovascular;  Laterality: N/A;   PACEMAKER IMPLANT N/A 06/26/2023   Procedure: PACEMAKER IMPLANT;  Surgeon: Inocencio Soyla Lunger, MD;  Location: [facility] INVASIVE CV LAB;  Service: Cardiovascular;  Laterality: N/A;   PROSTATE BIOPSY         Home Medications    Prior to Admission medications   Medication Sig Start Date End Date Taking? Authorizing Provider  albuterol  (VENTOLIN  HFA) 108 (90 Base) MCG/ACT inhaler Inhale 2 puffs into the lungs every 4 (four) hours as needed for wheezing or shortness of breath. 10/12/24  Yes Ival Domino, FNP  atorvastatin  (LIPITOR )  80 MG tablet Take 1 tablet (80 mg total) by mouth every evening. 02/06/21  Yes Monetta Redell PARAS, MD  BREO ELLIPTA  200-25 MCG/ACT AEPB Inhale 1 puff into the lungs daily. 12/13/21  Yes [provider]  carvedilol  (COREG ) 12.5 MG tablet Take 1.5 tablets (18.75 mg total) by mouth 2 (two) times daily with a meal. 02/06/21  Yes Monetta Redell PARAS, MD  clopidogrel  (PLAVIX ) 75 MG tablet Take 1 tablet (75 mg total) by mouth daily with breakfast. 02/06/21  Yes Monetta Redell PARAS, MD  ELIQUIS  5 MG TABS tablet TAKE 1 TABLET(5 MG) BY MOUTH TWICE DAILY 09/08/24  Yes Monetta Redell PARAS, MD  ezetimibe  (ZETIA ) 10 MG tablet Take 1 tablet (10 mg total) by mouth daily. 07/08/24  Yes Krasowski, Cobi J, MD  montelukast  (SINGULAIR ) 10 MG tablet Take 10 mg by mouth daily. 04/04/15  Yes [provider]  pantoprazole  (PROTONIX ) 40 MG tablet Take 1 tablet (40 mg  total) by mouth daily. 10/06/24  Yes Monetta Redell PARAS, MD  promethazine -dextromethorphan (PROMETHAZINE -DM) 6.25-15 MG/5ML syrup Take 5 mLs by mouth 4 (four) times daily as needed for cough. Do not use and drive - May make drowsy. 10/12/24  Yes Ival Domino, FNP  tamsulosin  (FLOMAX ) 0.4 MG CAPS capsule Take 1 capsule (0.4 mg total) by mouth daily. 04/08/22  Yes Harris, Abigail, PA-C  valsartan  (DIOVAN ) 80 MG tablet Take 1 tablet (80 mg total) by mouth daily. 02/06/21  Yes Monetta Redell PARAS, MD  Ascorbic Acid (VITAMIN C) 1000 MG tablet Take 2,000 mg by mouth daily.    [provider]  cholecalciferol (VITAMIN D3) 25 MCG (1000 UNIT) tablet Take 1,000 Units by mouth daily.    [provider]  Cyanocobalamin (B-12 PO) Take 1 tablet by mouth daily.     [provider]    Family History Family History  Problem Relation Age of Onset   Hyperlipidemia Mother    Hypertension Mother    Diabetes Mother    Heart attack Maternal Uncle    Heart attack Maternal Grandmother     Social History Social History   Tobacco Use   Smoking status: Former    Current packs/day: 0.00    Average packs/day: 1 pack/day for 15.0 years (15.0 ttl pk-yrs)    Types: Cigarettes    Start date: 11/19/1961    Quit date: 11/19/1976    Years since quitting: 47.9   Smokeless tobacco: Former    Types: Chew    Quit date: 1980  Vaping Use   Vaping status: Never Used  Substance Use Topics   Alcohol use: No   Drug use: No     Allergies   Pneumovax 23 [pneumococcal vac polyvalent]   Review of Systems Review of Systems  Constitutional:  Negative for chills and fever.  HENT:  Negative for ear pain and sore throat.   Eyes:  Negative for pain and visual disturbance.  Respiratory:  Positive for cough, chest tightness, shortness of breath and wheezing.   Cardiovascular:  Negative for chest pain and palpitations.  Gastrointestinal:  Negative for abdominal pain, constipation, diarrhea, nausea and  vomiting.  Genitourinary:  Negative for dysuria and hematuria.  Musculoskeletal:  Negative for arthralgias and back pain.  Skin:  Negative for color change and rash.  Neurological:  Negative for seizures and syncope.  All other systems reviewed and are negative.    Physical Exam Triage Vital Signs ED Triage Vitals  Encounter Vitals Group     BP 10/12/24 1411 121/74  Girls Systolic BP Percentile --      Girls Diastolic BP Percentile --      Boys Systolic BP Percentile --      Boys Diastolic BP Percentile --      Pulse Rate 10/12/24 1411 78     Resp 10/12/24 1411 18     Temp 10/12/24 1411 98 F (36.7 C)     Temp Source 10/12/24 1411 Oral     SpO2 10/12/24 1411 93 %     Weight --      Height --      Head Circumference --      Peak Flow --      Pain Score 10/12/24 1409 0     Pain Loc --      Pain Education --      Exclude from Growth Chart --    No data found.  Updated Vital Signs BP 121/74 (BP Location: Right Arm)   Pulse 78   Temp 98 F (36.7 C) (Oral)   Resp 18   SpO2 93%   Visual Acuity Right Eye Distance:   Left Eye Distance:   Bilateral Distance:    Right Eye Near:   Left Eye Near:    Bilateral Near:     Physical Exam Vitals and nursing note reviewed.  Constitutional:      General: He is not in acute distress.    Appearance: He is well-developed. He is not ill-appearing or toxic-appearing.  HENT:     Head: Normocephalic and atraumatic.     Right Ear: Hearing, tympanic membrane, ear canal and external ear normal.     Left Ear: Hearing, tympanic membrane, ear canal and external ear normal.     Nose: No congestion or rhinorrhea.     Right Sinus: No maxillary sinus tenderness or frontal sinus tenderness.     Left Sinus: No maxillary sinus tenderness or frontal sinus tenderness.     Mouth/Throat:     Lips: Pink.     Mouth: Mucous membranes are moist.     Pharynx: Uvula midline. No oropharyngeal exudate or posterior oropharyngeal erythema.      Tonsils: No tonsillar exudate.  Eyes:     Conjunctiva/sclera: Conjunctivae normal.     Pupils: Pupils are equal, round, and reactive to light.  Cardiovascular:     Rate and Rhythm: Normal rate and regular rhythm.     Heart sounds: S1 normal and S2 normal. No murmur heard. Pulmonary:     Effort: Pulmonary effort is normal. No respiratory distress.     Breath sounds: Examination of the right-upper field reveals wheezing. Examination of the left-upper field reveals wheezing. Examination of the right-middle field reveals wheezing. Examination of the left-middle field reveals wheezing. Examination of the right-lower field reveals wheezing. Examination of the left-lower field reveals wheezing. Wheezing (Inspiratory and expiratory wheezes throughout) present. No decreased breath sounds, rhonchi or rales.  Abdominal:     General: Bowel sounds are normal.     Palpations: Abdomen is soft.     Tenderness: There is no abdominal tenderness.  Musculoskeletal:        General: No swelling.     Cervical back: Neck supple.  Lymphadenopathy:     Head:     Right side of head: No submental, submandibular, tonsillar, preauricular or posterior auricular adenopathy.     Left side of head: No submental, submandibular, tonsillar, preauricular or posterior auricular adenopathy.     Cervical: No cervical adenopathy.     Right  cervical: No superficial cervical adenopathy.    Left cervical: No superficial cervical adenopathy.  Skin:    General: Skin is warm and dry.     Capillary Refill: Capillary refill takes less than 2 seconds.     Findings: No rash.  Neurological:     Mental Status: He is alert and oriented to person, place, and time.  Psychiatric:        Mood and Affect: Mood normal.      UC Treatments / Results  Labs (all labs ordered are listed, but only abnormal results are displayed) Labs Reviewed - No data to display  EKG   Radiology No results found.  Procedures Procedures (including  critical care time)  Medications Ordered in UC Medications  methylPREDNISolone  acetate (DEPO-MEDROL ) injection 80 mg (80 mg Intramuscular Given 10/12/24 1429)    Initial Impression / Assessment and Plan / UC Course  I have reviewed the triage vital signs and the nursing notes.  Pertinent labs & imaging results that were available during my care of the patient were reviewed by me and considered in my medical decision making (see chart for details).  Plan of Care: COPD exacerbation with shortness of breath and cough: Promethazine  DM, 5 mL, every 6 hours if needed for cough.  Continue Breo Ellipta  inhaler as previously prescribed.  Use albuterol  inhaler, 2 puffs, every 4 hours if needed for wheezing.  Depo-Medrol  80 mg injection now.  Follow-up if symptoms do not improve, worsen or new symptoms occur.  I reviewed the plan of care with the patient and/or the patient's guardian.  The patient and/or guardian had time to ask questions and acknowledged that the questions were answered.  I provided instruction on symptoms or reasons to return here or to go to an ER, if symptoms/condition did not improve, worsened or if new symptoms occurred.  Final Clinical Impressions(s) / UC Diagnoses   Final diagnoses:  COPD exacerbation (HCC)  Shortness of breath  Acute cough     Discharge Instructions      COPD exacerbation with shortness of breath and cough: Promethazine  DM, 5 mL, every 6 hours if needed for cough.  Continue Breo Ellipta  inhaler as previously prescribed.  Use albuterol  inhaler, 2 puffs, every 4 hours if needed for wheezing.  Depo-Medrol  80 mg injection now.  Follow-up if symptoms do not improve, worsen or new symptoms occur.     ED Prescriptions     Medication Sig Dispense Auth. Provider   promethazine -dextromethorphan (PROMETHAZINE -DM) 6.25-15 MG/5ML syrup Take 5 mLs by mouth 4 (four) times daily as needed for cough. Do not use and drive - May make drowsy. 118 mL Ival Domino,  FNP   albuterol  (VENTOLIN  HFA) 108 (90 Base) MCG/ACT inhaler Inhale 2 puffs into the lungs every 4 (four) hours as needed for wheezing or shortness of breath. 6.7 g Ival Domino, FNP      PDMP not reviewed this encounter.   Ival Domino, FNP 10/12/24 [id number]

## 2024-10-12 NOTE — Discharge Instructions (Addendum)
 COPD exacerbation with shortness of breath and cough: Promethazine  DM, 5 mL, every 6 hours if needed for cough.  Continue Breo Ellipta  inhaler as previously prescribed.  Use albuterol  inhaler, 2 puffs, every 4 hours if needed for wheezing.  Depo-Medrol  80 mg injection now.  Follow-up if symptoms do not improve, worsen or new symptoms occur.

## 2024-10-16 MED ORDER — PREDNISONE 20 MG PO TABS: 20.0000 mg | ORAL_TABLET | Freq: Every day | ORAL | 0 refills | Status: AC

## 2024-10-16 NOTE — Telephone Encounter (Signed)
 Patient reports that he has been taking the medication as provided and he is still coughing and wheezing.  We had discussed that he would try conservative therapy and if he was not improving at all and I would add some steroids.  I will add prednisone 20 mg daily for 5 days and we will call that to [facility] to his pharmacy.  He will pick it up tomorrow morning.  I was able to speak to the patient and clarified all of this with him and he agreed to the plan of care.

## 2024-10-30 NOTE — Progress Notes (Unsigned)
°  Electrophysiology Office Note:   Date:  10/30/2024  ID:  Zigmond Trela, DOB 1951/08/10, MRN [id number]  Primary Cardiologist: Redell Leiter, MD Primary Heart Failure: None Electrophysiologist: Lillard Bailon Gladis Norton, MD  {Click to update primary MD,subspecialty MD or APP then REFRESH:1}    History of Present Illness:   Derek Hart is a 73 y.o. male with h/o Mobitz 2 AV block, coronary disease, hypertension, hyperlipidemia seen today for routine electrophysiology followup.   Since last being seen in our clinic the patient reports doing ***.  he denies chest pain, palpitations, dyspnea, PND, orthopnea, nausea, vomiting, dizziness, syncope, edema, weight gain, or early satiety.   Review of systems complete and found to be negative unless listed in HPI.      EP Information / Studies Reviewed:    {EKGtoday:28818}      PPM Interrogation-  reviewed in detail today,  See PACEART report.  Device History: Abbott Dual Chamber PPM implanted 06/26/2023 for Second Degree AV block  Risk Assessment/Calculations:   {Does this patient have ATRIAL FIBRILLATION?:[id number]}         Physical Exam:   VS:  There were no vitals taken for this visit.   Wt Readings from Last 3 Encounters:  06/15/24 193 lb (87.5 kg)  04/30/24 192 lb (87.1 kg)  02/03/24 195 lb 6.4 oz (88.6 kg)     GEN: Well nourished, well developed in no acute distress NECK: No JVD; No carotid bruits CARDIAC: {EPRHYTHM:28826}, no murmurs, rubs, gallops RESPIRATORY:  Clear to auscultation without rales, wheezing or rhonchi  ABDOMEN: Soft, non-tender, non-distended EXTREMITIES:  No edema; No deformity   ASSESSMENT AND PLAN:    Second Degree AV block s/p Abbott PPM  Normal PPM function See Pace Art report No changes today  2.  Coronary disease: No current ischemic symptoms.  Plan per primary cardiology.  3.  Hypertension:***  4.  Hyperlipidemia: Continue statin per primary cardiology  Disposition:   Follow up with  {EPPROVIDERS:28135::EP Team} {EPFOLLOW LE:71826}  Signed, Bryon Parker Gladis Norton, MD

## 2024-11-02 ENCOUNTER — Encounter: Payer: Self-pay | Admitting: Cardiology

## 2024-11-02 ENCOUNTER — Ambulatory Visit: Attending: Cardiology | Admitting: Cardiology

## 2024-11-02 VITALS — BP 144/88 | HR 60 | Ht 72.0 in | Wt 195.0 lb

## 2024-11-02 DIAGNOSIS — I10 Essential (primary) hypertension: Secondary | ICD-10-CM

## 2024-11-02 DIAGNOSIS — I441 Atrioventricular block, second degree: Secondary | ICD-10-CM

## 2024-11-02 DIAGNOSIS — I2510 Atherosclerotic heart disease of native coronary artery without angina pectoris: Secondary | ICD-10-CM

## 2024-11-02 LAB — CUP PACEART INCLINIC DEVICE CHECK
Battery Remaining Longevity: 110 mo
Battery Voltage: 2.99 V
Brady Statistic RA Percent Paced: 25 %
Brady Statistic RV Percent Paced: 85 %
Date Time Interrogation Session: 20251215084511
Implantable Lead Connection Status: 753985
Implantable Lead Connection Status: 753985
Implantable Lead Implant Date: 20240807
Implantable Lead Implant Date: 20240807
Implantable Lead Location: 753859
Implantable Lead Location: 753860
Implantable Pulse Generator Implant Date: 20240807
Lead Channel Impedance Value: 475 Ohm
Lead Channel Impedance Value: 475 Ohm
Lead Channel Pacing Threshold Amplitude: 0.75 V
Lead Channel Pacing Threshold Amplitude: 0.75 V
Lead Channel Pacing Threshold Amplitude: 0.75 V
Lead Channel Pacing Threshold Amplitude: 0.75 V
Lead Channel Pacing Threshold Pulse Width: 0.5 ms
Lead Channel Pacing Threshold Pulse Width: 0.5 ms
Lead Channel Pacing Threshold Pulse Width: 0.5 ms
Lead Channel Pacing Threshold Pulse Width: 0.5 ms
Lead Channel Sensing Intrinsic Amplitude: 5 mV
Lead Channel Sensing Intrinsic Amplitude: 6.6 mV
Lead Channel Setting Pacing Amplitude: 1.125
Lead Channel Setting Pacing Amplitude: 1.625
Lead Channel Setting Pacing Pulse Width: 0.5 ms
Lead Channel Setting Sensing Sensitivity: 2 mV
Pulse Gen Model: 2272
Pulse Gen Serial Number: 5819145

## 2024-12-15 ENCOUNTER — Ambulatory Visit: Attending: Cardiology | Admitting: Cardiology

## 2024-12-15 ENCOUNTER — Encounter: Payer: Self-pay | Admitting: Cardiology

## 2024-12-15 VITALS — BP 140/90 | HR 68 | Ht 72.0 in | Wt 198.0 lb

## 2024-12-15 DIAGNOSIS — I25119 Atherosclerotic heart disease of native coronary artery with unspecified angina pectoris: Secondary | ICD-10-CM

## 2024-12-15 DIAGNOSIS — E782 Mixed hyperlipidemia: Secondary | ICD-10-CM | POA: Diagnosis not present

## 2024-12-15 DIAGNOSIS — I10 Essential (primary) hypertension: Secondary | ICD-10-CM | POA: Diagnosis not present

## 2024-12-15 DIAGNOSIS — R0609 Other forms of dyspnea: Secondary | ICD-10-CM | POA: Diagnosis not present

## 2024-12-15 DIAGNOSIS — I255 Ischemic cardiomyopathy: Secondary | ICD-10-CM

## 2024-12-15 MED ORDER — SACUBITRIL-VALSARTAN 24-26 MG PO TABS: 1.0000 | ORAL_TABLET | Freq: Two times a day (BID) | ORAL | 3 refills | Status: AC

## 2024-12-15 MED ORDER — NITROGLYCERIN 0.4 MG SL SUBL: 0.4000 mg | SUBLINGUAL_TABLET | SUBLINGUAL | 6 refills | Status: AC | PRN

## 2024-12-15 NOTE — Patient Instructions (Addendum)
 Medication Instructions:   STOP: Diovan    START: Entresto  24-26 1 tablet twice daily   Lab Work: Your physician recommends that you return for lab work in: 1 week You need to have labs done when you are fasting.  You can come Monday through Friday 8:30 am to 12:00 pm and 1:15 to 4:30. You do not need to make an appointment as the order has already been placed.     Testing/Procedures: 6 months prior to 6 month follow up appt Your physician has requested that you have an echocardiogram. Echocardiography is a painless test that uses sound waves to create images of your heart. It provides your doctor with information about the size and shape of your heart and how well your hearts chambers and valves are working. This procedure takes approximately one hour. There are no restrictions for this procedure. Please do NOT wear cologne, perfume, aftershave, or lotions (deodorant is allowed). Please arrive 15 minutes prior to your appointment time.  Please note: We ask at that you not bring children with you during ultrasound (echo/ vascular) testing. Due to room size and safety concerns, children are not allowed in the ultrasound rooms during exams. Our front office staff cannot provide observation of children in our lobby area while testing is being conducted. An adult accompanying a patient to their appointment will only be allowed in the ultrasound room at the discretion of the ultrasound technician under special circumstances. We apologize for any inconvenience.    Follow-Up: At [facility], you and your health needs are our priority.  As part of our continuing mission to provide you with exceptional heart care, we have created designated Provider Care Teams.  These Care Teams include your primary Cardiologist (physician) and Advanced Practice Providers (APPs -  Physician Assistants and Nurse Practitioners) who all work together to provide you with the care you need, when you need it.  We  recommend signing up for the patient portal called MyChart.  Sign up information is provided on this After Visit Summary.  MyChart is used to connect with patients for Virtual Visits (Telemedicine).  Patients are able to view lab/test results, encounter notes, upcoming appointments, etc.  Non-urgent messages can be sent to your provider as well.   To learn more about what you can do with MyChart, go to [email or web address].    Your next appointment:   6 month(s)  The format for your next appointment:   In Person  Provider:   Lamar Fitch, MD    Other Instructions NA

## 2024-12-15 NOTE — Progress Notes (Unsigned)
 " Cardiology Office Note:    Date:  12/15/2024   ID:  Derek Hart, DOB 1951-10-14, MRN [id number]  PCP:  Thurmond Cathlyn LABOR., MD  Cardiologist:  Lamar Fitch, MD    Referring MD: Thurmond Cathlyn LABOR., MD   Chief Complaint  Patient presents with   Follow-up  Doing fine  History of Present Illness:     Derek Hart is a 74 y.o. male past medical history significant for coronary artery disease he did have multiple intervention coronary bypass graft last intervention required stenting to LAD and RCA in circumflex, ejection fraction 40 to 45%, AV block required pacemaker which is [facility] [employer] Jude device, dyslipidemia, essential hypertension.  Comes today to months for follow-up, cardiac wise doing well.  Denies have any chest pain tightness squeezing pressure burning chest  Past Medical History:  Diagnosis Date   AKI (acute kidney injury) 06/25/2023   Allergic rhinitis 07/19/2016   Anemia, chronic disease 03/09/2021   Asthma 07/19/2016   Atherosclerotic heart disease of native coronary artery without angina pectoris 07/28/2015    CAD/ MI/ CABG  Overview:  [place].  MI LAD stent. 01/2017 Overview:  He had MI with staged PCI of LAD and later RCA and LCF 2015, EF 40% Overview:  Added automatically from request for surgery [id number]   Bradycardia 06/24/2023   Cardiomyopathy (HCC) 07/19/2016   Overview:  EF 45% or 54% 01/2017   Chest pain 10/25/2020   Chest pain in adult 02/01/2017   Chronic anticoagulation 10/21/2017   Chronic bronchitis (HCC)    Chronic systolic heart failure (HCC) 06/24/2023   COPD (chronic obstructive pulmonary disease) (HCC) 04/26/2015   PFT-06/27/2015-minimal obstructive airways disease with insignificant response to bronchodilator, minimal diffusion defect, normal lung volumes. FEV1/FVC 0.76, DLCO 72%   Coronary artery disease    Coronary artery disease involving native coronary artery of native heart with angina pectoris 07/28/2015   CAD/ MI/ CABG     Overview:   [place].  MI  LAD stent. 01/2017  Overview:   He had MI with staged PCI of LAD and later RCA and LCF 2015, EF 40%  Overview:   Added automatically from request for surgery [id number]     Dyspnea on exertion 04/26/2015   Elevated alkaline phosphatase level 04/06/2021   Formatting of this note might be different from the original.  Isoenzymes look more liver. US  ordered 04/06/2021     Elevated PSA 09/28/2021   Emphysema lung (HCC)    treated for asthma but later found out it was emphysema (10/15/2017)   Essential hypertension 07/19/2016   Fibrosis of liver 09/26/2021   Formatting of this note might be different from the original.  Neg work up.  No bx because of blood thinners.     GERD (gastroesophageal reflux disease)    Hematuria 08/15/2018   High risk medication use 07/19/2016   Hyperlipidemia    left lung   Inguinal hernia 07/19/2016   Overview:  Mild bilateral.   Ischemic cardiomyopathy 05/30/2017   Lung nodule seen on imaging study 07/28/2015   chest x-ray report from 03/05/2014 at [facility] described a vague nodular density in the left upper lobe and recommended follow-up CT scan which was never done.    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   Second degree  heart block 06/24/2023   Stage 3 chronic kidney disease (HCC) 07/19/2016   STEMI (ST elevation myocardial infarction) [facility]) 10/18/2017   Feb 2015    Past Surgical History:  Procedure Laterality Date   CARDIAC CATHETERIZATION  11/2016   CATARACT EXTRACTION W/ INTRAOCULAR LENS  IMPLANT, BILATERAL Bilateral    CORONARY ANGIOPLASTY WITH STENT PLACEMENT  12/2013-02/2014   2 + 3 stents   CORONARY ANGIOPLASTY WITH STENT PLACEMENT  01/2017   distal LAD   CORONARY STENT  INTERVENTION N/A 10/25/2020   Procedure: CORONARY STENT INTERVENTION;  Surgeon: Jordan, Peter M, MD;  Location: [facility] INVASIVE CV LAB;  Service: Cardiovascular;  Laterality: N/A;   CORONARY ULTRASOUND/IVUS N/A 10/25/2020   Procedure: Intravascular Ultrasound/IVUS;  Surgeon: Jordan, Peter M, MD;  Location: [facility] INVASIVE CV LAB;  Service: Cardiovascular;  Laterality: N/A;   KNEE ARTHROSCOPY Right 1982   LEFT HEART CATH AND CORONARY ANGIOGRAPHY N/A 10/25/2020   Procedure: LEFT HEART CATH AND CORONARY ANGIOGRAPHY;  Surgeon: Jordan, Peter M, MD;  Location: [facility] INVASIVE CV LAB;  Service: Cardiovascular;  Laterality: N/A;   PACEMAKER IMPLANT N/A 06/26/2023   Procedure: PACEMAKER IMPLANT;  Surgeon: Inocencio Soyla Lunger, MD;  Location: [facility] INVASIVE CV LAB;  Service: Cardiovascular;  Laterality: N/A;   PROSTATE BIOPSY      Current Medications: Active Medications[1]   Allergies:   Pneumovax 23 [pneumococcal vac polyvalent]   Social History   Socioeconomic History   Marital status: Married    Spouse name: Not on file   Number of children: 2   Years of education: 12   Highest education level: Not on file  Occupational History   Not on file  Tobacco Use   Smoking status: Former    Current packs/day: 0.00    Average packs/day: 1 pack/day for 15.0 years (15.0 ttl pk-yrs)    Types: Cigarettes    Start date: 11/19/1961    Quit date: 11/19/1976    Years since quitting: 48.1   Smokeless tobacco: Former    Types: Chew    Quit date: 1980  Vaping Use   Vaping status: Never Used  Substance and Sexual Activity   Alcohol use: No   Drug use: No   Sexual activity: Not Currently  Other Topics Concern   Not on file  Social History Narrative   Not on file   Social Drivers of Health   Tobacco Use: Medium Risk (12/15/2024)   Patient History    Smoking Tobacco Use: Former    Smokeless Tobacco Use: Former    Passive Exposure: Not on Actuary Strain: Not on file  Food Insecurity: Low Risk  (09/24/2024)   Received from [facility]   Epic    Within the past 12 months, you worried that your food would run out before you got money to buy more: Never true    Within the past 12 months, the food you bought just didn't last and you didn't have money to get more. : Never true  Transportation Needs: No Transportation Needs (09/24/2024)   Received from [facility]    In the past 12 months, has lack of reliable transportation kept you from medical appointments, meetings, work or from getting things needed for daily living? : No  Physical Activity: Not on file  Stress: Not on file  Social Connections: Not on file  Depression (PHQ2-9): Low Risk (02/03/2024)   Depression (PHQ2-9)    PHQ-2 Score: 0  Alcohol Screen: Low Risk (02/03/2024)  Alcohol Screen    Last Alcohol Screening Score (AUDIT): 0  Housing: Low Risk (09/24/2024)   Received from [facility]   Epic    What is your living situation today?: I have a steady place to live    Think about the place you live. Do you have problems with any of the following? Choose all that apply:: None/None on this list  Utilities: Low Risk (09/24/2024)   Received from [facility]   Utilities    In the past 12 months has the electric, gas, oil, or water company threatened to shut off services in your home? : No  Health Literacy: Not on file     Family History: The patient's family history includes Diabetes in his mother; Heart attack in his maternal grandmother and maternal uncle; Hyperlipidemia in his mother; Hypertension in his mother. ROS:   Please see the history of present illness.    All 14 point review of systems negative except as described per history of present illness  EKGs/Labs/Other Studies Reviewed:         Recent Labs: 08/19/2024: ALT 21  Recent Lipid Panel    Component Value Date/Time   CHOL 136 08/19/2024 0911   TRIG 65 08/19/2024 0911   HDL 49 08/19/2024 0911   CHOLHDL 2.8 08/19/2024 0911    LDLCALC 74 08/19/2024 0911    Physical Exam:    VS:  Ht 6' (1.829 m)   Wt 198 lb (89.8 kg)   BMI 26.85 kg/m     Wt Readings from Last 3 Encounters:  12/15/24 198 lb (89.8 kg)  11/02/24 195 lb (88.5 kg)  06/15/24 193 lb (87.5 kg)     GEN:  Well nourished, well developed in no acute distress HEENT: Normal NECK: No JVD; No carotid bruits LYMPHATICS: No lymphadenopathy CARDIAC: RRR, no murmurs, no rubs, no gallops RESPIRATORY:  Clear to auscultation without rales, wheezing or rhonchi  ABDOMEN: Soft, non-tender, non-distended MUSCULOSKELETAL:  No edema; No deformity  SKIN: Warm and dry LOWER EXTREMITIES: no swelling NEUROLOGIC:  Alert and oriented x 3 PSYCHIATRIC:  Normal affect   ASSESSMENT:    1. Ischemic cardiomyopathy   2. Coronary artery disease involving native coronary artery of native heart with angina pectoris   3. Essential hypertension   4. Mixed hyperlipidemia    PLAN:    In order of problems listed above:  Coronary disease stable from that point review on guideline directed medical therapy he is on Plavix  and Eliquis , Eliquis  is for history of PE, Plavix  is because of quite aggressive and advanced coronary artery disease, likely within last few years there is no new issues.  Will continue that management we had a long discussion about it. Ischemic cardiomyopathy ejection fraction 4045%.  Will try to put him back on Entresto  which became generic previously complained of price of the medication and hopefully he will to opt for generic Entresto .  Will schedule him to have another echocardiogram done at summertime. Essential hypertension blood pressure slightly elevated today hopefully with the addition of Entresto  and discontinuation of Diovan  will be better. Dyslipidemia I did review his KPN his LDL 74 HDL 49 continue present management.    Medication Adjustments/Labs and Tests Ordered: Current medicines are reviewed at length with the patient today.  Concerns  regarding medicines are outlined above.  No orders of the defined types were placed in this encounter.  Medication changes: No orders of the defined types were placed in this encounter.   Signed, Lamar PARAS.  Bernie, MD, [facility] 12/15/2024 4:36 PM    [facility]    [1]  Current Meds  Medication Sig   albuterol  (VENTOLIN  HFA) 108 (90 Base) MCG/ACT inhaler Inhale 2 puffs into the lungs every 4 (four) hours as needed for wheezing or shortness of breath.   Ascorbic Acid (VITAMIN C) 1000 MG tablet Take 2,000 mg by mouth daily.   atorvastatin  (LIPITOR ) 80 MG tablet Take 1 tablet (80 mg total) by mouth every evening.   BREO ELLIPTA  200-25 MCG/ACT AEPB Inhale 1 puff into the lungs daily.   carvedilol  (COREG ) 12.5 MG tablet Take 1.5 tablets (18.75 mg total) by mouth 2 (two) times daily with a meal.   cholecalciferol (VITAMIN D3) 25 MCG (1000 UNIT) tablet Take 1,000 Units by mouth daily.   Clobetasol Propionate (TEMOVATE) 0.05 % external spray Apply 1 spray topically as needed (dry scalp).   clopidogrel  (PLAVIX ) 75 MG tablet Take 1 tablet (75 mg total) by mouth daily with breakfast.   Cyanocobalamin (B-12 PO) Take 1 tablet by mouth daily.    ELIQUIS  5 MG TABS tablet TAKE 1 TABLET(5 MG) BY MOUTH TWICE DAILY   ezetimibe  (ZETIA ) 10 MG tablet Take 1 tablet (10 mg total) by mouth daily.   montelukast  (SINGULAIR ) 10 MG tablet Take 10 mg by mouth daily.   pantoprazole  (PROTONIX ) 40 MG tablet Take 1 tablet (40 mg total) by mouth daily.   promethazine -dextromethorphan (PROMETHAZINE -DM) 6.25-15 MG/5ML syrup Take 5 mLs by mouth 4 (four) times daily as needed for cough. Do not use and drive - May make drowsy.   tamsulosin  (FLOMAX ) 0.4 MG CAPS capsule Take 1 capsule (0.4 mg total) by mouth daily.   triamcinolone  cream (KENALOG ) 0.1 % Apply 1 Application topically as directed.   valsartan  (DIOVAN ) 80 MG tablet Take 1 tablet (80 mg total) by mouth daily.   "

## 2024-12-25 ENCOUNTER — Ambulatory Visit

## 2024-12-25 LAB — CUP PACEART REMOTE DEVICE CHECK
Battery Remaining Longevity: 106 mo
Battery Remaining Percentage: 88 %
Battery Voltage: 2.99 V
Brady Statistic AP VP Percent: 29 %
Brady Statistic AP VS Percent: 1 %
Brady Statistic AS VP Percent: 71 %
Brady Statistic AS VS Percent: 1 %
Brady Statistic RA Percent Paced: 28 %
Brady Statistic RV Percent Paced: 99 %
Date Time Interrogation Session: 20260206040012
Implantable Lead Connection Status: 753985
Implantable Lead Connection Status: 753985
Implantable Lead Implant Date: 20240807
Implantable Lead Implant Date: 20240807
Implantable Lead Location: 753859
Implantable Lead Location: 753860
Implantable Pulse Generator Implant Date: 20240807
Lead Channel Impedance Value: 480 Ohm
Lead Channel Impedance Value: 480 Ohm
Lead Channel Pacing Threshold Amplitude: 0.625 V
Lead Channel Pacing Threshold Amplitude: 0.875 V
Lead Channel Pacing Threshold Pulse Width: 0.5 ms
Lead Channel Pacing Threshold Pulse Width: 0.5 ms
Lead Channel Sensing Intrinsic Amplitude: 5 mV
Lead Channel Sensing Intrinsic Amplitude: 8.6 mV
Lead Channel Setting Pacing Amplitude: 1.125
Lead Channel Setting Pacing Amplitude: 1.625
Lead Channel Setting Pacing Pulse Width: 0.5 ms
Lead Channel Setting Sensing Sensitivity: 2 mV
Pulse Gen Model: 2272
Pulse Gen Serial Number: 5819145

## 2025-03-26 ENCOUNTER — Ambulatory Visit

## 2025-06-25 ENCOUNTER — Ambulatory Visit

## 2025-09-24 ENCOUNTER — Ambulatory Visit
